# Patient Record
Sex: Female | Born: 1990 | Race: Black or African American | Hispanic: No
Health system: Midwestern US, Community
[De-identification: ages and names within clinical notes are randomized; demographics above are authoritative.]

## PROBLEM LIST (undated history)

## (undated) DIAGNOSIS — A64 Unspecified sexually transmitted disease: Secondary | ICD-10-CM

## (undated) DIAGNOSIS — Z3482 Encounter for supervision of other normal pregnancy, second trimester: Secondary | ICD-10-CM

## (undated) DIAGNOSIS — O4703 False labor before 37 completed weeks of gestation, third trimester: Secondary | ICD-10-CM

## (undated) DIAGNOSIS — O26892 Other specified pregnancy related conditions, second trimester: Secondary | ICD-10-CM

## (undated) DIAGNOSIS — Z3492 Encounter for supervision of normal pregnancy, unspecified, second trimester: Secondary | ICD-10-CM

## (undated) DIAGNOSIS — O210 Mild hyperemesis gravidarum: Secondary | ICD-10-CM

## (undated) DIAGNOSIS — O26893 Other specified pregnancy related conditions, third trimester: Secondary | ICD-10-CM

## (undated) DIAGNOSIS — O2312 Infections of bladder in pregnancy, second trimester: Secondary | ICD-10-CM

## (undated) DIAGNOSIS — O4700 False labor before 37 completed weeks of gestation, unspecified trimester: Secondary | ICD-10-CM

## (undated) DIAGNOSIS — O4692 Antepartum hemorrhage, unspecified, second trimester: Secondary | ICD-10-CM

## (undated) DIAGNOSIS — Z3483 Encounter for supervision of other normal pregnancy, third trimester: Secondary | ICD-10-CM

## (undated) DIAGNOSIS — O99891 Other specified diseases and conditions complicating pregnancy: Secondary | ICD-10-CM

## (undated) DIAGNOSIS — Z3493 Encounter for supervision of normal pregnancy, unspecified, third trimester: Secondary | ICD-10-CM

## (undated) DIAGNOSIS — O98319 Other infections with a predominantly sexual mode of transmission complicating pregnancy, unspecified trimester: Secondary | ICD-10-CM

## (undated) DIAGNOSIS — O4690 Antepartum hemorrhage, unspecified, unspecified trimester: Secondary | ICD-10-CM

## (undated) DIAGNOSIS — O2343 Unspecified infection of urinary tract in pregnancy, third trimester: Secondary | ICD-10-CM

## (undated) DIAGNOSIS — E079 Disorder of thyroid, unspecified: Secondary | ICD-10-CM

## (undated) DIAGNOSIS — E669 Obesity, unspecified: Secondary | ICD-10-CM

## (undated) DIAGNOSIS — R45851 Suicidal ideations: Principal | ICD-10-CM

## (undated) MED ORDER — METRONIDAZOLE 0.75 % VAGINAL GEL
0.75 % (37.5mg/5 gram) | Freq: Two times a day (BID) | VAGINAL | Status: AC
Start: ? — End: 2013-02-03

## (undated) MED ORDER — IBUPROFEN 600 MG TAB
600 mg | ORAL_TABLET | Freq: Three times a day (TID) | ORAL | Status: AC | PRN
Start: ? — End: 2012-07-10

## (undated) MED ORDER — HYDROCODONE-ACETAMINOPHEN 5 MG-325 MG TAB
5-325 mg | ORAL_TABLET | ORAL | Status: DC | PRN
Start: ? — End: 2014-02-14

## (undated) MED ORDER — OXYCODONE-ACETAMINOPHEN 10 MG-325 MG TAB
10-325 mg | ORAL_TABLET | Freq: Four times a day (QID) | ORAL | Status: DC | PRN
Start: ? — End: 2014-02-14

## (undated) MED ORDER — ONDANSETRON 4 MG TAB, RAPID DISSOLVE
4 mg | ORAL_TABLET | Freq: Three times a day (TID) | ORAL | Status: DC | PRN
Start: ? — End: 2013-03-12

## (undated) MED ORDER — PRENATAL VIT#27-IRON CARBONYL,FUM 60 MG IRON-FOLIC ACID 1 MG TABLET
60-1 mg iron-mg | ORAL_TABLET | Freq: Every day | ORAL | Status: AC
Start: ? — End: 2012-11-04

## (undated) MED ORDER — IBUPROFEN 600 MG TAB
600 mg | ORAL_TABLET | Freq: Three times a day (TID) | ORAL | Status: DC | PRN
Start: ? — End: 2013-07-02

## (undated) MED ORDER — METRONIDAZOLE 0.75 % VAGINAL GEL
0.75 % (37.5mg/5 gram) | Freq: Every day | VAGINAL | Status: AC
Start: ? — End: 2013-03-04

## (undated) MED ORDER — PRENATAL VIT#27-IRON CARBONYL,FUM 60 MG IRON-FOLIC ACID 1 MG TABLET
60-1 mg iron-mg | ORAL_TABLET | Freq: Every day | ORAL | Status: AC
Start: ? — End: 2012-09-02

## (undated) MED ORDER — FERROUS SULFATE 325 MG (65 MG ELEMENTAL IRON) TAB
325 mg (65 mg iron) | ORAL_TABLET | Freq: Three times a day (TID) | ORAL | Status: DC
Start: ? — End: 2014-02-14

## (undated) MED ORDER — PNV WITH CA,NO.72-IRON,CARBONYL-FA 29 MG IRON-1 MG TABLET
29 mg iron- 1 mg | ORAL_TABLET | Freq: Every day | ORAL | Status: DC
Start: ? — End: 2013-01-23

## (undated) MED ORDER — NITROFURANTOIN (25% MACROCRYSTAL FORM) 100 MG CAP
100 mg | ORAL_CAPSULE | Freq: Two times a day (BID) | ORAL | Status: DC
Start: ? — End: 2013-01-23

## (undated) MED ORDER — TRIMETHOPRIM-SULFAMETHOXAZOLE 160 MG-800 MG TAB
160-800 mg | ORAL_TABLET | Freq: Two times a day (BID) | ORAL | Status: AC
Start: ? — End: 2012-07-06

## (undated) MED ORDER — DOCUSATE SODIUM 100 MG CAP
100 mg | ORAL_CAPSULE | Freq: Every day | ORAL | Status: AC
Start: ? — End: 2013-06-12

---
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
## 2012-03-18 NOTE — L&D Delivery Note (Signed)
Delivery Summary    Patient: Linda [name] MRN: [id number]  SSN: [id number]    Date of Birth: [birth year]  Age: 22 y.o.  Sex: female       Information for the patient's newborn:  Ronnette, Rump Boy A [[id number]]       Labor Events:   Preterm Labor: No   Rupture Date: 03/12/2013   Rupture Time: 9:20 PM   Rupture Type AROM   Amniotic Fluid Volume: Moderate    Amniotic Fluid Description: Clear   Induction: AROM;Foley Bulb (balloon);Oxytocin       Augmentation:    Labor Events:       Cervical Ripening:   Foley/EASI     Delivery Events:  Episiotomy: None   Laceration(s): None     Repaired: None    Number of Repair Packets:    Suture Type and Size:       Estimated Blood Loss (ml):       Delivery Date: 03/12/2013    Delivery Time: 11:48 PM  Delivery Type: Spontaneous Vaginal Delivery   Sex:  Female     Gestational Age: 21.4 weeks.  Delivery Clinician:    Living Status: Yes  Delivery Location: L&D            APGARS  One minute Five minutes Ten minutes   Skin Color: 0   1       Heart Rate: 2   2       Reflex Irritability: 2   2       Muscle Tone: 2   2       Respiration: 2   2       Total: 8  9        Presentation: Vertex    Position: Left Occiput Anterior  Resuscitation Method:  Suctioning-bulb  Tactile Stimulation     Meconium Stained: None      Cord Information: 3 Vessels      Cord Events: None      Cord Blood Sent?:  Yes    Blood Gases Sent?:  No    Placenta:  Date: 03/12/2013   Time: 11:53 PM  Removal: Spontaneous    Appearance: Normal       Newborn Measurements:  Birth Weight: 7 lb 15 oz (3.6 kg)    Birth Length: 54.6 cm    Head Circumference: 33.5 cm    Chest Circumference: 35 cm    Abdominal Girth: 13.386    Other Providers:   Sena Slate Houle  Serafina Mitchell  Kiran S Debnath  Kelli Hope Obstetrician  Primary Nurse  Primary Newborn Nurse  Staff Nurse  Anesthesiologist  Scrub Tech                 Group B Strep:   Lab Results   Component Value Date/Time    GrBS positive  08/27/2011     Information for the patient's newborn:  Oreta, Soloway Boy A [[id number]]     Lab Results   Component Value Date/Time    ABO/Rh(D) [place] POSITIVE 03/12/2013 11:48 PM    DAT IgG NEG 03/12/2013 11:48 PM     No results found for this basename: aph, apco2, apo2, ahco3, abec, abdc, o2st, site, rscom

## 2012-07-03 LAB — WET PREP

## 2012-07-03 LAB — URINALYSIS W/ RFLX MICROSCOPIC
Bilirubin: NEGATIVE
Blood: NEGATIVE
Glucose: NEGATIVE mg/dL
Ketone: NEGATIVE mg/dL
Nitrites: NEGATIVE
Protein: NEGATIVE mg/dL
Specific gravity: 1.025 (ref 1.003–1.030)
Urobilinogen: 0.2 EU/dL (ref 0.2–1.0)
pH (UA): 6 (ref 5.0–8.0)

## 2012-07-03 LAB — URINE MICROSCOPIC ONLY: WBC: 21 /hpf (ref 0–4)

## 2012-07-03 LAB — HCG URINE, QL: HCG urine, QL: NEGATIVE

## 2012-07-03 MED ADMIN — azithromycin (ZITHROMAX) tablet 1,000 mg: ORAL | @ 15:00:00 | NDC 50268009815

## 2012-07-03 MED ADMIN — cefTRIAXone (ROCEPHIN) injection 250 mg: INTRAMUSCULAR | @ 15:00:00 | NDC 00409733701

## 2012-07-03 NOTE — ED Provider Notes (Signed)
HPI Comments: 22yo female presents to ER complaining of lower abdominal pain and left knee pain.  Abdominal discomfort started about 1 week ago, left knee started about 2-3 days ago.  LMP was at the beginning of last month.  Sexually active, no protection.   Left knee pain worse with weight bearing.     Patient is a 22 y.o. female presenting with abdominal pain and leg pain.   Abdominal Pain   Associated symptoms include arthralgias. Pertinent negatives include no fever, no diarrhea, no nausea, no vomiting, no constipation, no dysuria, no frequency, no chest pain and no back pain.   Leg Pain   Pertinent negatives include no back pain.        Past Medical History   Diagnosis Date   ??? Hypothyroidism    ??? Endocrine disease      hypothyroidism   ??? Acquired hypothyroidism      hypothroidism   ??? Morbid obesity         History reviewed. No pertinent past surgical history.      History reviewed. No pertinent family history.     History     Social History   ??? Marital Status: SINGLE     Spouse Name: N/A     Number of Children: N/A   ??? Years of Education: N/A     Occupational History   ??? Not on file.     Social History Main Topics   ??? Smoking status: Never Smoker    ??? Smokeless tobacco: Not on file   ??? Alcohol Use: No   ??? Drug Use: No   ??? Sexually Active: Yes -- Female partner(s)     Birth Control/ Protection: None     Other Topics Concern   ??? Not on file     Social History Narrative   ??? No narrative on file                  ALLERGIES: Percocet      Review of Systems   Constitutional: Negative for fever, chills, activity change, appetite change and fatigue.   HENT: Negative.    Respiratory: Negative.  Negative for shortness of breath.    Cardiovascular: Negative for chest pain.   Gastrointestinal: Positive for abdominal pain. Negative for nausea, vomiting, diarrhea, constipation and abdominal distention.   Genitourinary: Positive for pelvic pain. Negative for dysuria, frequency, flank pain, vaginal bleeding, vaginal discharge,  difficulty urinating, vaginal pain and dyspareunia.   Musculoskeletal: Positive for arthralgias. Negative for back pain and gait problem.   Skin: Negative for rash and wound.   All other systems reviewed and are negative.        Filed Vitals:    07/03/12 0957   BP: 145/83   Pulse: 77   Temp: 97.6 ??F (36.4 ??C)   Resp: 16   Height: 5\' 6"  (1.676 m)   Weight: 104.327 kg (230 lb)   SpO2: 99%            Physical Exam   Nursing note and vitals reviewed.  Constitutional: She is oriented to person, place, and time. No distress.   obese   HENT:   Mouth/Throat: Oropharynx is clear and moist.   Eyes: No scleral icterus.   Neck: Normal range of motion.   Cardiovascular: Normal rate, regular rhythm and normal heart sounds.    Pulmonary/Chest: Effort normal and breath sounds normal.   Abdominal: Soft. Bowel sounds are normal. She exhibits no distension and no mass. There  is tenderness. There is no guarding.   Musculoskeletal: Normal range of motion. She exhibits no edema and no tenderness.   No TTP, no swelling, no redness of left knee.  Pain in popliteal area with quadricep and hamstring tightening.  Mild discomfort with flexion.   Neurological: She is alert and oriented to person, place, and time.   Skin: Skin is warm and dry. She is not diaphoretic.        MDM     Differential Diagnosis; Clinical Impression; Plan:     23yo female presents with 2 non-related complaints, lower abdominal discomfort and left knee pain.  I feel patient has concern about not having menstrual cycle since the beginning of March but history shows irregular cycles in past.   Left knee pain is atraumatic and I do not feel emergent imaging is warranted.   UA shows many wbc's and 4+ bacteria, denies urinary symptoms but will treat with Bactrim x 3 days.  UCx pending.  Will treat empirically for STD's  Wet prep shows many clue cells but patient is without vaginal irritation.      Procedures    PELVIC EXAM- EXTERNAL GENITALIA WITHOUT LESIONS, ERYTHEMA, SCANT  DISCHARGE.  SPECULUM EXAM- NO ERYTHEMA OF VAGINAL MUCOSA OR CERVIX, MODERATE CREAMY WHITE DISCHARGE, MALODOROUS.  BIMANUAL EXAM- MILD BILATERAL ADNEXAL TTP, NO MASSES.  NO CMT

## 2012-07-03 NOTE — ED Notes (Signed)
Pt. States she had on & off lower abdominal pain,  For a week , left leg pain  Since yesterday

## 2012-07-03 NOTE — ED Notes (Signed)
Patient reports intermittent left lower abdominal pain and left leg pain.  Patient is sexually active with no protection.  G1P1.  Patient denies being pregnant.

## 2012-07-03 NOTE — ED Notes (Signed)
Patient armband removed and shredded  I have reviewed discharge instructions with the patient.  The patient verbalized understanding.

## 2012-07-03 NOTE — ED Provider Notes (Signed)
I was personally available for consultation in the emergency department. I have reviewed the chart prior to the patient's discharge and agree with the documentation recorded by the MLP, including the assessment, treatment plan, and disposition.

## 2012-07-04 LAB — CULTURE, URINE
Culture result:: >100000
Culture: >100000

## 2012-07-06 LAB — CHLAMYDIA/GC PCR
Chlamydia amplified: POSITIVE — AB
N. gonorrhea, amplified: NEGATIVE

## 2012-07-07 NOTE — Progress Notes (Signed)
Quick Note:    CALLED PATIENT BACK AND INFORMED HER OF + CHLAMYDIA. SHE WAS TREATED IN ER WITH ZITHROMAX AND ROCEPHIN. INFORMED TO NOTIFY PARTNERS TO FOLLOW UP WITH HEALTH DEPARTMENT FOR TREATMENT AND TO AVOID UNPROTECTED SEX.  ______

## 2012-07-07 NOTE — ED Notes (Cosign Needed)
CALLED PATIENT BACK AND INFORMED HER OF + CHLAMYDIA.  SHE WAS TREATED IN ER WITH ZITHROMAX AND ROCEPHIN.  INFORMED TO NOTIFY PARTNERS TO FOLLOW UP WITH HEALTH DEPARTMENT FOR TREATMENT AND TO AVOID UNPROTECTED SEX.

## 2012-07-27 LAB — AMB POC URINE PREGNANCY TEST, VISUAL COLOR COMPARISON: HCG urine, Ql. (POC): POSITIVE

## 2012-07-27 NOTE — Patient Instructions (Signed)
MyChart Activation    Thank you for requesting access to MyChart. Please follow the instructions below to securely access and download your online medical record. MyChart allows you to send messages to your doctor, view your test results, renew your prescriptions, schedule appointments, and more.    How Do I Sign Up?    1. In your internet browser, go to [email or web address]  2. Click on the First Time User? Click Here link in the Sign In box. You will be redirect to the New Member Sign Up page.  3. Enter your MyChart Access Code exactly as it appears below. You will not need to use this code after you???ve completed the sign-up process. If you do not sign up before the expiration date, you must request a new code.    MyChart Access Code: [email or web address]  Expires: 10/01/2012 11:17 AM (This is the date your MyChart access code will expire)    4. Enter the last four digits of your Social Security Number (xxxx) and Date of Birth (mm/dd/yyyy) as indicated and click Submit. You will be taken to the next sign-up page.  5. Create a MyChart ID. This will be your MyChart login ID and cannot be changed, so think of one that is secure and easy to remember.  6. Create a MyChart password. You can change your password at any time.  7. Enter your Password Reset Question and Answer. This can be used at a later time if you forget your password.   8. Enter your e-mail address. You will receive e-mail notification when new information is available in MyChart.  9. Click Sign Up. You can now view and download portions of your medical record.  10. Click the Download Summary menu link to download a portable copy of your medical information.    Additional Information    If you have questions, please visit the Frequently Asked Questions section of the MyChart website at [email or web address] Remember, MyChart is NOT to be used for urgent needs. For medical emergencies, dial 911.

## 2012-07-27 NOTE — Progress Notes (Signed)
SUBJECTIVE:  This is a 22 year old G1 P1 who presents with missed menses x1. She denies any nausea, vomiting, or breast tenderness. She has not performed a pregnancy test. Past medical history was reviewed.[01]   OBJECTIVE:  Vital signs are stable.  She's well-developed obese black female in no apparent distress.  Abdomen obese and nontender.  External genitalia normal.  Vaginal canal normal.  Cervix normal.  Uterus normal size but soft.  Adnexa unremarkable  HCG positive02]  ASSESSMENT:  Missed menses secondary to early gestation.[03]  PLAN:  1.  Prenatal vitamins daily.  2. Follow up in one month.[04]

## 2012-07-28 LAB — BETA HCG, QT
HCG, BETA, HCGTLT: 45869 m[IU]/mL
HCG, beta, QT: 45869 m[IU]/mL

## 2012-08-03 LAB — N GONORRHOEAE, DNA PROBE: Gonorrhea, External: NEGATIVE

## 2012-08-03 LAB — CHLAMYDIA DNA PROBE: Chlamydia, External: NEGATIVE

## 2012-08-03 LAB — RPR
RPR, EXTERNAL: NONREACTIVE
RPR, External: NONREACTIVE

## 2012-08-03 LAB — HEPATITIS B SURFACE AG W/REFLEX: HBsAg, External: NEGATIVE

## 2012-08-03 LAB — HIV-1 AB WESTERN BLOT CONFIRM ONLY
HIV, EXTERNAL: NEGATIVE
HIV, External: NEGATIVE

## 2012-08-03 LAB — RUBELLA AB, IGG: Rubella, External: IMMUNE

## 2012-08-03 NOTE — Patient Instructions (Signed)
MyChart Activation    Thank you for requesting access to MyChart. Please follow the instructions below to securely access and download your online medical record. MyChart allows you to send messages to your doctor, view your test results, renew your prescriptions, schedule appointments, and more.    How Do I Sign Up?    1. In your internet browser, go to [email or web address]  2. Click on the First Time User? Click Here link in the Sign In box. You will be redirect to the New Member Sign Up page.  3. Enter your MyChart Access Code exactly as it appears below. You will not need to use this code after you???ve completed the sign-up process. If you do not sign up before the expiration date, you must request a new code.    MyChart Access Code: [email or web address]  Expires: 10/01/2012 11:17 AM (This is the date your MyChart access code will expire)    4. Enter the last four digits of your Social Security Number (xxxx) and Date of Birth (mm/dd/yyyy) as indicated and click Submit. You will be taken to the next sign-up page.  5. Create a MyChart ID. This will be your MyChart login ID and cannot be changed, so think of one that is secure and easy to remember.  6. Create a MyChart password. You can change your password at any time.  7. Enter your Password Reset Question and Answer. This can be used at a later time if you forget your password.   8. Enter your e-mail address. You will receive e-mail notification when new information is available in MyChart.  9. Click Sign Up. You can now view and download portions of your medical record.  10. Click the Download Summary menu link to download a portable copy of your medical information.    Additional Information    If you have questions, please visit the Frequently Asked Questions section of the MyChart website at [email or web address] Remember, MyChart is NOT to be used for urgent needs. For medical emergencies, dial 911.

## 2012-08-03 NOTE — Progress Notes (Signed)
Please see flow sheet.  7 weeks 6 days doing well.  Follow up in 4 weeks.

## 2012-08-07 LAB — PRENATAL PROFILE I
ABS. BASOPHILS: 0 10*3/uL (ref 0.0–0.2)
ABS. EOSINOPHILS: 0.1 10*3/uL (ref 0.0–0.4)
ABS. IMM. GRANS.: 0 10*3/uL (ref 0.0–0.1)
ABS. MONOCYTES: 0.3 10*3/uL (ref 0.1–0.9)
ABS. NEUTROPHILS: 2.1 10*3/uL (ref 1.4–7.0)
Abs Lymphocytes: 1.5 10*3/uL (ref 0.7–3.1)
Antibody screen: NEGATIVE
BASOPHILS: 1 % (ref 0–3)
EOSINOPHILS: 2 % (ref 0–5)
HCT: 35.4 % (ref 34.0–46.6)
HGB: 11.9 g/dL (ref 11.1–15.9)
Hep B surface Ag screen: NEGATIVE
IMMATURE GRANULOCYTES: 0 % (ref 0–2)
Lymphocytes: 37 % (ref 14–46)
MCH: 26 pg — ABNORMAL LOW (ref 26.6–33.0)
MCHC: 33.6 g/dL (ref 31.5–35.7)
MCV: 78 fL — ABNORMAL LOW (ref 79–97)
MONOCYTES: 8 % (ref 4–12)
NEUTROPHILS: 52 % (ref 40–74)
PLATELET: 291 10*3/uL (ref 155–379)
RBC: 4.57 x10E6/uL (ref 3.77–5.28)
RDW: 16.6 % — ABNORMAL HIGH (ref 12.3–15.4)
RPR: NONREACTIVE
Rh (D): POSITIVE
Rubella Ab, IgG: 6.75 index
WBC: 4 10*3/uL (ref 3.4–10.8)

## 2012-08-07 LAB — HIV 1/2 AB, BY IMMUNOBLOT
HIV 1/O/2 Abs, QL: NONREACTIVE
HIV 1/O/2 Abs, Qual: NONREACTIVE
HIV 1/O/2 Abs-Index Value: <1 (ref <1.00)
HIV 1/O/2 Abs: <1 (ref <1.00)

## 2012-08-07 LAB — HEMOGLOBIN FRACTIONATION
HEMOGLOBIN A2: 3.9 % — ABNORMAL HIGH (ref 0.7–3.1)
HEMOGLOBIN F: 0 % (ref 0.0–2.0)
HEMOGLOBIN S: 0 %
HGB A: 63.3 % — ABNORMAL LOW (ref 94.0–98.0)
HGB SOLUBILITY: NEGATIVE
Hemoglobin A2: 3.9 % — ABNORMAL HIGH (ref 0.7–3.1)
Hemoglobin A: 63.3 % — ABNORMAL LOW (ref 94.0–98.0)
Hemoglobin C: 32.8 % — ABNORMAL HIGH
Hemoglobin C: 32.8 % — ABNORMAL HIGH
Hemoglobin F: 0 % (ref 0.0–2.0)
Hemoglobin S: 0 %
Hgb Solubility: NEGATIVE

## 2012-10-05 NOTE — Patient Instructions (Signed)
MyChart Activation    Thank you for requesting access to MyChart. Please follow the instructions below to securely access and download your online medical record. MyChart allows you to send messages to your doctor, view your test results, renew your prescriptions, schedule appointments, and more.    How Do I Sign Up?    1. In your internet browser, go to [email or web address]  2. Click on the First Time User? Click Here link in the Sign In box. You will be redirect to the New Member Sign Up page.  3. Enter your MyChart Access Code exactly as it appears below. You will not need to use this code after you???ve completed the sign-up process. If you do not sign up before the expiration date, you must request a new code.    MyChart Access Code: [email or web address]  Expires: 01/03/2013  2:26 PM (This is the date your MyChart access code will expire)    4. Enter the last four digits of your Social Security Number (xxxx) and Date of Birth (mm/dd/yyyy) as indicated and click Submit. You will be taken to the next sign-up page.  5. Create a MyChart ID. This will be your MyChart login ID and cannot be changed, so think of one that is secure and easy to remember.  6. Create a MyChart password. You can change your password at any time.  7. Enter your Password Reset Question and Answer. This can be used at a later time if you forget your password.   8. Enter your e-mail address. You will receive e-mail notification when new information is available in MyChart.  9. Click Sign Up. You can now view and download portions of your medical record.  10. Click the Download Summary menu link to download a portable copy of your medical information.    Additional Information    If you have questions, please visit the Frequently Asked Questions section of the MyChart website at [email or web address] Remember, MyChart is NOT to be used for urgent needs. For medical emergencies, dial 911.

## 2012-10-05 NOTE — Progress Notes (Signed)
Please see flow sheet.  16 weeks 6 days doing well.  Macrobid for UTI.

## 2012-10-07 LAB — AFP TETRA SCREEN, OBSTETRICAL
AFP MoM: 1.12
AFP Value: 29.1 ng/mL
DIA MoM: 0.61
DIA Value: 82.19 pg/mL
DSR (2nd Trimester) - 1 in: 10000
DSR (by age) - 1 in: 1161
Gest. Age on Collection Date: 16 WEEKS
Maternal Age At EDD: 20.2 YEARS
OSBR Risk - 1 in: 10000
T18 (by age): 1:4522 {titer}
Weight: 264 [lb_av]
hCG MoM: 0.4
hCG Value: 9172 m[IU]/mL
uE3 MoM: 0.87
uE3 Value: 0.62 ng/mL

## 2012-11-03 NOTE — ED Notes (Signed)
Patient left ambulatory, no s/s of distress or discomfort

## 2012-11-03 NOTE — ED Notes (Signed)
Hematoma to left eye, rates pain as 10/10
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
## 2012-11-03 NOTE — ED Notes (Signed)
Per patient PD was called.
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
## 2012-11-04 LAB — HCG URINE, QL: HCG urine, QL: POSITIVE — AB

## 2012-11-04 MED ADMIN — HYDROcodone-acetaminophen (NORCO) 5-325 mg per tablet 1 Tab: ORAL | @ 01:00:00 | NDC 68084036811

## 2012-11-04 MED ADMIN — ondansetron (ZOFRAN ODT) tablet 4 mg: ORAL | @ 01:00:00 | NDC 68462015740

## 2012-11-13 NOTE — Patient Instructions (Signed)
MyChart Activation    Thank you for requesting access to MyChart. Please follow the instructions below to securely access and download your online medical record. MyChart allows you to send messages to your doctor, view your test results, renew your prescriptions, schedule appointments, and more.    How Do I Sign Up?    1. In your internet browser, go to [email or web address]  2. Click on the First Time User? Click Here link in the Sign In box. You will be redirect to the New Member Sign Up page.  3. Enter your MyChart Access Code exactly as it appears below. You will not need to use this code after you???ve completed the sign-up process. If you do not sign up before the expiration date, you must request a new code.    MyChart Access Code: [email or web address]  Expires: 01/03/2013  2:26 PM (This is the date your MyChart access code will expire)    4. Enter the last four digits of your Social Security Number (xxxx) and Date of Birth (mm/dd/yyyy) as indicated and click Submit. You will be taken to the next sign-up page.  5. Create a MyChart ID. This will be your MyChart login ID and cannot be changed, so think of one that is secure and easy to remember.  6. Create a MyChart password. You can change your password at any time.  7. Enter your Password Reset Question and Answer. This can be used at a later time if you forget your password.   8. Enter your e-mail address. You will receive e-mail notification when new information is available in MyChart.  9. Click Sign Up. You can now view and download portions of your medical record.  10. Click the Download Summary menu link to download a portable copy of your medical information.    Additional Information    If you have questions, please visit the Frequently Asked Questions section of the MyChart website at [email or web address] Remember, MyChart is NOT to be used for urgent needs. For medical emergencies, dial 911.

## 2012-11-13 NOTE — Progress Notes (Signed)
Please see flow sheet.  22 weeks 3 days doing well.  Follow up in 4 weeks.

## 2012-12-21 NOTE — Progress Notes (Signed)
Please see flow sheet.  27 weeks 6 days doing well.  Follow up in 2 weeks.

## 2012-12-21 NOTE — Patient Instructions (Signed)
MyChart Activation    Thank you for requesting access to MyChart. Please follow the instructions below to securely access and download your online medical record. MyChart allows you to send messages to your doctor, view your test results, renew your prescriptions, schedule appointments, and more.    How Do I Sign Up?    1. In your internet browser, go to [email or web address]  2. Click on the First Time User? Click Here link in the Sign In box. You will be redirect to the New Member Sign Up page.  3. Enter your MyChart Access Code exactly as it appears below. You will not need to use this code after you???ve completed the sign-up process. If you do not sign up before the expiration date, you must request a new code.    MyChart Access Code: [email or web address]  Expires: 01/03/2013  2:26 PM (This is the date your MyChart access code will expire)    4. Enter the last four digits of your Social Security Number (xxxx) and Date of Birth (mm/dd/yyyy) as indicated and click Submit. You will be taken to the next sign-up page.  5. Create a MyChart ID. This will be your MyChart login ID and cannot be changed, so think of one that is secure and easy to remember.  6. Create a MyChart password. You can change your password at any time.  7. Enter your Password Reset Question and Answer. This can be used at a later time if you forget your password.   8. Enter your e-mail address. You will receive e-mail notification when new information is available in MyChart.  9. Click Sign Up. You can now view and download portions of your medical record.  10. Click the Download Summary menu link to download a portable copy of your medical information.    Additional Information    If you have questions, please visit the Frequently Asked Questions section of the MyChart website at [email or web address] Remember, MyChart is NOT to be used for urgent needs. For medical emergencies, dial 911.

## 2012-12-22 LAB — GEST. DIABETES 1-HR SCREEN: Gestational Diabetes Screen: 104 mg/dL (ref 65–139)

## 2013-01-23 ENCOUNTER — Inpatient Hospital Stay: Payer: MEDICAID

## 2013-01-23 MED ADMIN — lactated ringers infusion: INTRAVENOUS | @ 19:00:00 | NDC 00409795309

## 2013-01-23 NOTE — Progress Notes (Signed)
Off monitor to void

## 2013-01-23 NOTE — Progress Notes (Signed)
G2 P1 presents with vaginal spotting since last night @ 32 weeks and 4 days  Denies water breaking , reports she has not felt her baby move today and has not  Had any food or liquids today . Reports sharp abdominal pain 9/10 lower abdomen. Abdomen palpates semisoft. Plan of care discussed with patient. TOCO and EFM monitor applied

## 2013-01-23 NOTE — Progress Notes (Addendum)
To radiology via wheel chair for ultrasound

## 2013-01-23 NOTE — H&P (Signed)
History and Physical      Pt is a G3P1001 at 32.4wk who presented to L&D complaining of vaginal bleeding.  Her prenatal course has been uncomplicated with Dr. Johny Chess.  Please refer to prenatal record for complete information regarding prenatal course.    AFVSS  FHT: + variability but nonreactive by criteria. Tracing became reactive after BPP  Toco: none  SVE: closed and thick with moderate blood noted by RN  Membranes: intact  BPP:8/8    A/P:  Reassuring maternal and fetal status.   F/u with Dr. Johny Chess Monday   Discharge home   F/u with primary OB.

## 2013-01-23 NOTE — Progress Notes (Signed)
Patient given verbal discharge instructions  Instructions given for bedrest and labor percautions and bleeding percautions. Verbalized understanding of teaching

## 2013-01-23 NOTE — Progress Notes (Signed)
Patient placed back on monitor scant amount of dark blood noted on pad

## 2013-01-23 NOTE — Progress Notes (Signed)
Returned from radiology via wheelchair

## 2013-01-23 NOTE — Progress Notes (Signed)
Call placed to Dr Welton Flakes informing of patients complaints and bleeding and nonreactive strip  New orders received

## 2013-01-23 NOTE — Progress Notes (Signed)
Call placed to Dr Welton Flakes informing of BPP 8/10 and scant amount of bleeding abdomen still remains semi soft okay to discharge to home with bed rest and followup with Dr, Johny Chess on Monday 11/ 10/14

## 2013-01-23 NOTE — Progress Notes (Signed)
Ultrasound paged for BPP due to NST not reactive

## 2013-01-27 NOTE — Progress Notes (Signed)
G 3 P 1@ 33 w 1 d.   Complains of vaginal bleeding and possible PROM..  See prenatal flow sheet.  Normal prenatal exam.   RTC 2 weeks.

## 2013-01-27 NOTE — Patient Instructions (Signed)
MyChart Activation    Thank you for requesting access to MyChart. Please follow the instructions below to securely access and download your online medical record. MyChart allows you to send messages to your doctor, view your test results, renew your prescriptions, schedule appointments, and more.    How Do I Sign Up?    1. In your internet browser, go to [email or web address]  2. Click on the First Time User? Click Here link in the Sign In box. You will be redirect to the New Member Sign Up page.  3. Enter your MyChart Access Code exactly as it appears below. You will not need to use this code after you???ve completed the sign-up process. If you do not sign up before the expiration date, you must request a new code.    MyChart Access Code: [email or web address]  Expires: 04/23/2013  4:25 PM (This is the date your MyChart access code will expire)    4. Enter the last four digits of your Social Security Number (xxxx) and Date of Birth (mm/dd/yyyy) as indicated and click Submit. You will be taken to the next sign-up page.  5. Create a MyChart ID. This will be your MyChart login ID and cannot be changed, so think of one that is secure and easy to remember.  6. Create a MyChart password. You can change your password at any time.  7. Enter your Password Reset Question and Answer. This can be used at a later time if you forget your password.   8. Enter your e-mail address. You will receive e-mail notification when new information is available in MyChart.  9. Click Sign Up. You can now view and download portions of your medical record.  10. Click the Download Summary menu link to download a portable copy of your medical information.    Additional Information    If you have questions, please visit the Frequently Asked Questions section of the MyChart website at [email or web address] Remember, MyChart is NOT to be used for urgent needs. For medical emergencies, dial 911.

## 2013-01-30 LAB — CHLAMYDIA/GC PCR
Chlamydia trachomatis, NAA: NEGATIVE
Neisseria gonorrhoeae, NAA: POSITIVE — AB

## 2013-02-01 NOTE — Progress Notes (Signed)
Quick Note:    Postive GC.--Please call in the patient for treatment with Rocephin 250 mg im.  ______

## 2013-02-02 NOTE — Telephone Encounter (Signed)
Pt was called and informed that her test for GC was postive and she needed to be treated for it. Pt was asked to come in on 11/ 19/2014 by 12 30 pm pt say she did not have away, she would have to give medicaid a 5 day notices and she had know one to bring her in.

## 2013-02-02 NOTE — Telephone Encounter (Signed)
Message copied by Melody Comas on Tue Feb 02, 2013  3:34 PM  ------       Message from: Basilia Jumbo       Created: Mon Feb 01, 2013 10:04 PM         Postive GC.--Please call in the patient for treatment with Rocephin 250 mg im.  ------

## 2013-02-03 NOTE — Progress Notes (Signed)
Pt here in office for Roecephin injection for treatment of Gonorrhea. She tolerated injection well in left gluteus and left office no problems or concerns.

## 2013-02-03 NOTE — Telephone Encounter (Signed)
Message copied by Melody Comas on Wed Feb 03, 2013  8:00 AM  ------       Message from: Basilia Jumbo       Created: Mon Feb 01, 2013 10:04 PM         Postive GC.--Please call in the patient for treatment with Rocephin 250 mg im.  ------

## 2013-02-03 NOTE — Telephone Encounter (Signed)
Message copied by Melody Comas on Wed Feb 03, 2013  8:01 AM  ------       Message from: Linda [name]       Created: Mon Feb 01, 2013 10:04 PM         Postive GC.--Please call in the patient for treatment with Rocephin 250 mg im.  ------

## 2013-02-03 NOTE — Telephone Encounter (Signed)
Pt called back on 02/02/2013 she will be in on 02/03/2013 to get treated for positive GC.

## 2013-02-03 NOTE — Patient Instructions (Signed)
MyChart Activation    Thank you for requesting access to MyChart. Please follow the instructions below to securely access and download your online medical record. MyChart allows you to send messages to your doctor, view your test results, renew your prescriptions, schedule appointments, and more.    How Do I Sign Up?    1. In your internet browser, go to [email or web address]  2. Click on the First Time User? Click Here link in the Sign In box. You will be redirect to the New Member Sign Up page.  3. Enter your MyChart Access Code exactly as it appears below. You will not need to use this code after you???ve completed the sign-up process. If you do not sign up before the expiration date, you must request a new code.    MyChart Access Code: [email or web address]  Expires: 04/23/2013  4:25 PM (This is the date your MyChart access code will expire)    4. Enter the last four digits of your Social Security Number (xxxx) and Date of Birth (mm/dd/yyyy) as indicated and click Submit. You will be taken to the next sign-up page.  5. Create a MyChart ID. This will be your MyChart login ID and cannot be changed, so think of one that is secure and easy to remember.  6. Create a MyChart password. You can change your password at any time.  7. Enter your Password Reset Question and Answer. This can be used at a later time if you forget your password.   8. Enter your e-mail address. You will receive e-mail notification when new information is available in MyChart.  9. Click Sign Up. You can now view and download portions of your medical record.  10. Click the Download Summary menu link to download a portable copy of your medical information.    Additional Information    If you have questions, please visit the Frequently Asked Questions section of the MyChart website at [email or web address] Remember, MyChart is NOT to be used for urgent needs. For medical emergencies, dial 911.

## 2013-02-10 NOTE — Progress Notes (Signed)
G 3 P 1@ 35 w 1 d.  Has no complaints.  See prenatal flow sheet.  Normal prenatal exam.   RTC 1 week.

## 2013-02-10 NOTE — Patient Instructions (Signed)
MyChart Activation    Thank you for requesting access to MyChart. Please follow the instructions below to securely access and download your online medical record. MyChart allows you to send messages to your doctor, view your test results, renew your prescriptions, schedule appointments, and more.    How Do I Sign Up?    1. In your internet browser, go to [email or web address]  2. Click on the First Time User? Click Here link in the Sign In box. You will be redirect to the New Member Sign Up page.  3. Enter your MyChart Access Code exactly as it appears below. You will not need to use this code after you???ve completed the sign-up process. If you do not sign up before the expiration date, you must request a new code.    MyChart Access Code: [email or web address]  Expires: 04/23/2013  4:25 PM (This is the date your MyChart access code will expire)    4. Enter the last four digits of your Social Security Number (xxxx) and Date of Birth (mm/dd/yyyy) as indicated and click Submit. You will be taken to the next sign-up page.  5. Create a MyChart ID. This will be your MyChart login ID and cannot be changed, so think of one that is secure and easy to remember.  6. Create a MyChart password. You can change your password at any time.  7. Enter your Password Reset Question and Answer. This can be used at a later time if you forget your password.   8. Enter your e-mail address. You will receive e-mail notification when new information is available in MyChart.  9. Click Sign Up. You can now view and download portions of your medical record.  10. Click the Download Summary menu link to download a portable copy of your medical information.    Additional Information    If you have questions, please visit the Frequently Asked Questions section of the MyChart website at [email or web address] Remember, MyChart is NOT to be used for urgent needs. For medical emergencies, dial 911.

## 2013-02-25 NOTE — Patient Instructions (Signed)
MyChart Activation    Thank you for requesting access to MyChart. Please follow the instructions below to securely access and download your online medical record. MyChart allows you to send messages to your doctor, view your test results, renew your prescriptions, schedule appointments, and more.    How Do I Sign Up?    1. In your internet browser, go to [email or web address]  2. Click on the First Time User? Click Here link in the Sign In box. You will be redirect to the New Member Sign Up page.  3. Enter your MyChart Access Code exactly as it appears below. You will not need to use this code after you???ve completed the sign-up process. If you do not sign up before the expiration date, you must request a new code.    MyChart Access Code: [email or web address]  Expires: 04/23/2013  4:25 PM (This is the date your MyChart access code will expire)    4. Enter the last four digits of your Social Security Number (xxxx) and Date of Birth (mm/dd/yyyy) as indicated and click Submit. You will be taken to the next sign-up page.  5. Create a MyChart ID. This will be your MyChart login ID and cannot be changed, so think of one that is secure and easy to remember.  6. Create a MyChart password. You can change your password at any time.  7. Enter your Password Reset Question and Answer. This can be used at a later time if you forget your password.   8. Enter your e-mail address. You will receive e-mail notification when new information is available in MyChart.  9. Click Sign Up. You can now view and download portions of your medical record.  10. Click the Download Summary menu link to download a portable copy of your medical information.    Additional Information    If you have questions, please visit the Frequently Asked Questions section of the MyChart website at [email or web address] Remember, MyChart is NOT to be used for urgent needs. For medical emergencies, dial 911.

## 2013-02-25 NOTE — Progress Notes (Signed)
G 3 P 1@ 37 w 2 d.  She was treated with Rocephin for + GC infection.  Has no complaints.  See prenatal flow sheet.  Normal prenatal exam.   RTC 1 week.  Repeat GC culture--done.  Rx for Ivery Quale is also given today.

## 2013-02-27 LAB — CHLAMYDIA/GC PCR
Chlamydia trachomatis, NAA: NEGATIVE
Neisseria gonorrhoeae, NAA: NEGATIVE

## 2013-03-12 ENCOUNTER — Inpatient Hospital Stay
Admit: 2013-03-12 | Discharge: 2013-03-14 | Disposition: A | Discharge status: Home / Self Care | Payer: MEDICAID | Attending: Obstetrics & Gynecology | Admitting: Obstetrics & Gynecology

## 2013-03-12 DIAGNOSIS — O99892 Other specified diseases and conditions complicating childbirth: Secondary | ICD-10-CM

## 2013-03-12 LAB — CBC WITH AUTOMATED DIFF
ABS. BASOPHILS: 0 10*3/uL (ref 0.0–0.06)
ABS. EOSINOPHILS: 0.1 10*3/uL (ref 0.0–0.4)
ABS. LYMPHOCYTES: 2.2 10*3/uL (ref 0.9–3.6)
ABS. MONOCYTES: 0.5 10*3/uL (ref 0.05–1.2)
ABS. NEUTROPHILS: 4.6 10*3/uL (ref 1.8–8.0)
BASOPHILS: 0 % (ref 0–2)
EOSINOPHILS: 2 % (ref 0–5)
HCT: 31.2 % — ABNORMAL LOW (ref 35.0–45.0)
HGB: 10.7 g/dL — ABNORMAL LOW (ref 12.0–16.0)
LYMPHOCYTES: 29 % (ref 21–52)
MCH: 25.8 PG (ref 24.0–34.0)
MCHC: 34.3 g/dL (ref 31.0–37.0)
MCV: 75.2 FL (ref 74.0–97.0)
MONOCYTES: 7 % (ref 3–10)
MPV: 9.1 FL — ABNORMAL LOW (ref 9.2–11.8)
NEUTROPHILS: 62 % (ref 40–73)
PLATELET: 193 10*3/uL (ref 135–420)
RBC: 4.15 M/uL — ABNORMAL LOW (ref 4.20–5.30)
RDW: 14.9 % — ABNORMAL HIGH (ref 11.6–14.5)
WBC: 7.5 10*3/uL (ref 4.6–13.2)

## 2013-03-12 LAB — TYPE & SCREEN
ABO/Rh(D): O POS
Antibody screen: NEGATIVE

## 2013-03-12 LAB — TYPE AND SCREEN
ABO/Rh: O POS
Antibody Screen: NEGATIVE

## 2013-03-12 MED ADMIN — oxytocin (PITOCIN) 20 units/1000 ml LR: INTRAVENOUS | @ 22:00:00 | NDC 61553076904

## 2013-03-12 MED ADMIN — oxytocin (PITOCIN) 20 units/1000 ml LR: INTRAVENOUS | @ 19:00:00 | NDC 61553076904

## 2013-03-12 MED ADMIN — oxytocin (PITOCIN) 20 units/1000 ml LR: INTRAVENOUS | @ 21:00:00 | NDC 61553076904

## 2013-03-12 MED ADMIN — penicillin G potassium (PFIZERPEN) 5 Million Units in 0.9% sodium chloride (MBP/ADV) 100 mL MBP: INTRAVENOUS | @ 19:00:00 | NDC 00338055318

## 2013-03-12 MED ADMIN — penicillin G pot (PFIZERPEN) 2.5 Million Units in 50 ml 0.9% NaCl: INTRAVENOUS | @ 23:00:00 | NDC 99990008227

## 2013-03-12 MED ADMIN — oxytocin (PITOCIN) 20 units/1000 ml LR: INTRAVENOUS | @ 19:00:00 | NDC 53191040901

## 2013-03-12 MED ADMIN — oxytocin (PITOCIN) 20 units/1000 ml LR: INTRAVENOUS | @ 20:00:00 | NDC 53191040901

## 2013-03-12 MED ADMIN — lactated ringers infusion: INTRAVENOUS | @ 17:00:00 | NDC 00409795309

## 2013-03-12 NOTE — Progress Notes (Signed)
Unable to keep fetus on monitor. Dr. Johny Chess at bedside with FSE.

## 2013-03-12 NOTE — Progress Notes (Signed)
Assumed care of patient. Patient reports pain 4/10. Is unsure if she wants an epidural.

## 2013-03-12 NOTE — Other (Signed)
Epidural Catheter Placement for Labor patient    Operator: Criselda Peaches, MD      Risks, benefits, limitations and alternatives were discussed with patient and consent obtained. Time out performed, correct patient and site identified.     Standard monitors applied.   Patient placed in sitting position, back prepped with Betadine. and draped aseptically.1.5% lidocaine  2 ml was injected in skin and subcutaneously.     18g Tuohy needle placed at L2 - L3 level. Epidural space was entered   by loss of resistance technique with air . No blood or CSF was aspirated via the epidural needle. Epidural catheter was placed  for 3 cm inside the epidural space via Tuohy needle, no paresthesias was felt and the catheter was secured on the skin with Tagederm tape,  A test dose of  2 ml. Xylocaine 1% with 1:200,000 epinephrine was injected after negative aspiration and no heart rate increase was observed with pulse oximetry.   I.V. LR about 800 ml was administered within 30 minutes before starting the epidural procedure.  An initial bolus dose of 10 ml of 0.2% Ropivacaine with 100 mcg of Sublimaze was given via epidural catheter after negative aspiration.     Initial analgesia level was obtained up to T 10 level.    Patient tolerated the procedure well, V.S. were monitored throughout and was within acceptable range. No apparent complications were found.     Epidural continuous infusion was started per epidural infusion order.    Visit Vitals   Item Reading   ??? BP 132/71   ??? Pulse 78   ??? Temp 98.2 ??F (36.8 ??C)   ??? Resp 18   ??? Ht 5\' 3"  (1.6 m)   ??? Wt 119.75 kg (264 lb)   ??? BMI 46.78 kg/m2   ??? Breastfeeding No       Criselda Peaches, MD  @DATE @  .10:24 PM

## 2013-03-12 NOTE — Progress Notes (Signed)
Pt c/o urge to push, Dr Johny Chess in. SVE 8cm AROM clear fluid. Pt instructed not to bear down with contractions

## 2013-03-12 NOTE — Progress Notes (Signed)
Patient seated at bedside for epidural. Dr. Myrtice Lauth and RNs Leticia Clas and [place] at bedside.

## 2013-03-12 NOTE — Progress Notes (Signed)
Dr. Johny Chess at bedside. Patient complaining of urge to push. Advised not to push. Patient requesting an epidural.

## 2013-03-12 NOTE — H&P (Signed)
History & Physical    Name: Linda [name] MRN: [id number]  SSN: [id number]    Date of Birth: 1991-03-13  Age: 22 y.o.  Sex: female        Subjective:     Estimated Date of Delivery: 03/16/13  OB History    Grav Para Term Preterm Abortions TAB SAB Ect Mult Living    3 1 1       1        # Outc Date GA Lbr Len/2nd Wgt Sex Del Anes PTL Lv    1 TRM 6/13 [redacted]w[redacted]d 00:00 7lb13.1oz(3.547kg) F VAGINAL DELI EPIDURAL AN No Yes    2 CUR             3 GRA             Comments: System Generated. Please review and update pregnancy details.          Ms. Hoelzer is admitted with pregnancy at [redacted]w[redacted]d for induction of labor. Prenatal course was normal pregnancy. Please see prenatal records for details.    Past Medical History   Diagnosis Date   ??? Hypothyroidism    ??? Endocrine disease      hypothyroidism   ??? Acquired hypothyroidism      hypothroidism   ??? Morbid obesity      No past surgical history on file.  Social History     Occupational History   ??? Not on file.     Social History Main Topics   ??? Smoking status: Never Smoker    ??? Smokeless tobacco: Not on file   ??? Alcohol Use: No   ??? Drug Use: No   ??? Sexually Active: Yes -- Female partner(s)     Birth Control/ Protection: None     No family history on file.    Allergies   Allergen Reactions   ??? Percocet [Oxycodone-Acetaminophen] Nausea and Vomiting     Prior to Admission medications    Medication Sig Start Date End Date Taking? Authorizing Provider   PRENATAL VIT/IRON FUMARATE/FA (RIGHT STEP PRENATAL VITAMINS PO) Take 1 tablet by mouth daily.   Yes Historical Provider        Review of Systems: A comprehensive review of systems was negative.    Objective:     Vitals:  Filed Vitals:    03/12/13 0809 03/12/13 0850   BP: 119/65    Pulse: 85    Height:  5\' 3"  (1.6 m)   Weight:  264 lb (119.75 kg)        Physical Exam:  Patient without distress.  Back: costovertebral angle tenderness absent  Abdomen: soft, nontender  Fundus: soft and non tender  Perineum: blood absent, amniotic fluid absent   Cervical Exam: 1 cm dilated    60% effaced    -3 station    Presenting Part: cephalic  Lower Extremities:  - No evidence of DVT seen on physical exam.  Membranes:  Intact  Fetal Heart Rate: Reactive  Baseline: 140 per minute  Accelerations: yes    Prenatal Labs:   Lab Results   Component Value Date/Time    ABO,Rh 0+ 08/27/2011    Rubella immune 08/03/2012    GrBS positive 08/27/2011    HBsAg negative 08/03/2012    HIV negative 08/03/2012    RPR nonreactive 08/03/2012    Gonorrhea negative 08/03/2012    Chlamydia negative 08/03/2012         Assessment/Plan:     Plan: Admit for Continue plan for vaginal  delivery.  Group B Strep was positive, will treat prophylactically with penicillin.    Signed By:  Basilia Jumbo, MD     March 12, 2013

## 2013-03-12 NOTE — Progress Notes (Signed)
Dr. Oredein at bedside.

## 2013-03-12 NOTE — Progress Notes (Signed)
SSE given with good results

## 2013-03-12 NOTE — Other (Addendum)
Assumed care of patient feeling pressure. Patient states she has to push.

## 2013-03-12 NOTE — Progress Notes (Signed)
SVE 10/100/+3. Dr. Johny Chess at bedside.

## 2013-03-12 NOTE — Progress Notes (Signed)
TOCO readjusted

## 2013-03-12 NOTE — Progress Notes (Signed)
Patient up to restroom

## 2013-03-12 NOTE — Progress Notes (Signed)
Dawson Bills at bedside pushing with patient.

## 2013-03-12 NOTE — Progress Notes (Signed)
Patient reports feeling the urge to push. SVE done. All RN can feel is bag of water. Called Dr. Johny Chess to advise. He is on his way.

## 2013-03-12 NOTE — Progress Notes (Signed)
Report given to J. Loretha Brasil.

## 2013-03-12 NOTE — Progress Notes (Signed)
Unable to assess baseline heart rate. Fetal heart rate audible.

## 2013-03-13 LAB — HEMATOCRIT: HCT: 27.1 % — ABNORMAL LOW (ref 35.0–45.0)

## 2013-03-13 LAB — HEMOGLOBIN: HGB: 9.3 g/dL — ABNORMAL LOW (ref 12.0–16.0)

## 2013-03-13 MED ORDER — RHO D IMMUNE GLOBULIN 300 MCG IM SYRG
1500 unit (300 mcg) | INTRAMUSCULAR | Status: DC | PRN
Start: 2013-03-13 — End: 2013-03-14

## 2013-03-13 MED ADMIN — methylergonovine (METHERGINE) 0.2 mg/mL (1 mL) injection 0.2 mg: INTRAMUSCULAR | @ 12:00:00 | NDC 17478050101

## 2013-03-13 MED ADMIN — acetaminophen (TYLENOL) tablet 650 mg: ORAL | @ 16:00:00 | NDC 50580050130

## 2013-03-13 MED ADMIN — butorphanol (STADOL) 1 mg/mL injection: INTRAVENOUS | @ 12:00:00 | NDC 00409162301

## 2013-03-13 MED ADMIN — lactated ringers infusion: INTRAVENOUS | @ 01:00:00 | NDC 00409795309

## 2013-03-13 MED ADMIN — oxytocin in lactated ringers 20 unit/1,000 mL infusion soln: @ 12:00:00 | NDC 61553076904

## 2013-03-13 MED ADMIN — penicillin G pot (PFIZERPEN) 2.5 Million Units in 50 ml 0.9% NaCl: INTRAVENOUS | @ 03:00:00 | NDC 99990008227

## 2013-03-13 MED ADMIN — HYDROcodone-acetaminophen (NORCO) 10-325 mg tablet 1 tablet: ORAL | @ 10:00:00 | NDC 51079077901

## 2013-03-13 MED ADMIN — ibuprofen (MOTRIN) 400 mg tablet: ORAL | @ 06:00:00 | NDC 00904585361

## 2013-03-13 MED ADMIN — HYDROcodone-acetaminophen (NORCO) 10-325 mg tablet 1 tablet: ORAL | @ 19:00:00 | NDC 51079077901

## 2013-03-13 MED ADMIN — oxytocin in lactated ringers 20 unit/1,000 mL infusion soln: INTRAVENOUS | @ 13:00:00 | NDC 61553076904

## 2013-03-13 MED ADMIN — fentaNYL-ropivacaine in NS (PF) 1.5 mcg/mL-0.2% epidural infusion: EPIDURAL | @ 04:00:00 | NDC 61553019052

## 2013-03-13 MED ADMIN — lactated ringers infusion: INTRAVENOUS | @ 01:00:00 | NDC 11845118709

## 2013-03-13 MED ADMIN — fentaNYL citrate (PF) injection 100 mcg: EPIDURAL | @ 04:00:00 | NDC 00409909422

## 2013-03-13 NOTE — Other (Signed)
Pushing with contractions

## 2013-03-13 NOTE — Progress Notes (Signed)
VSS. Lochia down to a decreased moderate to increased scant amount. Fundus firm at the umbilicus. Will continue to monitor.

## 2013-03-13 NOTE — Other (Signed)
Saline lock infiltrated and removed. Saline lock insert attempted per Stephens Shire, RN, without success.

## 2013-03-13 NOTE — Progress Notes (Signed)
Assisted to BR and voided large amount clear yellow urine. Peri care instructed/given. Assisted back to bed and having moderate rubra lochia with some small clots. Fundus massaged and firm at umbilicus. Dr. Willene Hatchet notified of same and order received to give 1000 ml of LR with 20 units pitocin X 1 liter. Will continue to monitor.

## 2013-03-13 NOTE — Progress Notes (Signed)
Post Procedure Note     Patient is s/p-Vaginal Delivery with Lumbar Epidural       Visit Vitals   Item Reading   ??? BP 126/74   ??? Pulse 61   ??? Temp 97.9 ??F (36.6 ??C)   ??? Resp 20   ??? Ht 5\' 3"  (1.6 m)   ??? Wt 119.75 kg (264 lb)   ??? BMI 46.78 kg/m2   ??? SpO2 100%   ??? Breastfeeding No     C/o of mild back pain    Mental status-Alert,oriented x 3    No apparent Nausea/vomiting.    Oxygenation-adequate.    Nerological status-grossly intact.    No apparent Anesthetic complications.        Leroy Libman, MD

## 2013-03-13 NOTE — Other (Signed)
Bedside and Verbal shift change report given to TRACEY M GARRETT, RN   (oncoming nurse) by Robin W RN (offgoing nurse). Report included the following information SBAR, Kardex and MAR.

## 2013-03-13 NOTE — Progress Notes (Signed)
Dr. Johny Chess present and new orders received. Methergine 0.2 mg IM administered as ordered.

## 2013-03-13 NOTE — Other (Signed)
TRANSFER - IN REPORT:    Verbal report received from Dorette Grate, RN, (name) on Linda [name]  being received from L&D (unit) for routine progression of care      Report consisted of patient???s Situation, Background, Assessment and   Recommendations(SBAR).     Information from the following report(s) SBAR, Kardex, Procedure Summary, Intake/Output, MAR and Recent Results was reviewed with the receiving nurse.    Opportunity for questions and clarification was provided.      Assessment completed upon patient???s arrival to unit and care assumed.

## 2013-03-13 NOTE — Progress Notes (Signed)
Continues to have a moderate amount of lochia. Bolus of 1000 ml LR with 20 units pitocin started as ordered.

## 2013-03-13 NOTE — Progress Notes (Addendum)
1610   Assumed care of pt. Azucena Cecil RN  At bedside. Dr Johny Chess here. .  More  blood clots evacuated. 570ml+. Fundus now firm at umb. Vs wnl. Advised pt to call if heavy bleeding returns and not to get oob without help. Nursing assessment completed.    0820   Called to pt room. Pt states feels funny after stadol. Feeling anxious when trying to eat. Tech remains at bedside giving reassurance. Vs wnl. o2 2l nc applied. Decreased moderate lochia flow. New pad applied.    0900    Pt resting with eyes closed. No distress noted.    1000    Awakened. Placed on bedpan. Voided qs bloody urine. Increased scant lochia rubra. Fundus firm at umb. New pad applied.    1200   Assisted to [facility]. Voided large amt. Denies dizziness. Scant lochia rubra. Assisted with am care. New pad applied.    1500   Visiting with family.    1530   Assisted to bathroom. Voided qs. Small vaginal spotting. Peri care per self. No dizziness when up.    1700  Sitting up in bed. Eating dinner.

## 2013-03-13 NOTE — Progress Notes (Signed)
Saline lock inserted by Arletha Pili, RN, # 20 left hand X 1 attempt.

## 2013-03-13 NOTE — Progress Notes (Signed)
Stadol 2 mg slow IV push given per Irven [place] RN, as ordered. Sterile vaginal exam per Dr. Johny Chess and he expressed several large clots with a greater than 500 ml blood loss. Stat H&H ordered.

## 2013-03-13 NOTE — Progress Notes (Signed)
RETURN OB VISIT SUMMARY    Subjective:     Pt denies complaints. Doing well.    Objective:   Physical Exam:  Patient Vitals for the past 8 hrs:   BP Temp Pulse Resp SpO2   03/14/13 0743 123/67 mmHg 97.5 ??F (36.4 ??C) 62 18 99 %   03/14/13 0524 133/65 mmHg 97.1 ??F (36.2 ??C) 71 20 98 %       Breast:  Non-Engorged  Lungs:clear  Uterine Fundus:firm  Abdomen:bowel sounds present and normal in all 4 quadrants  Incision:none  Lochia: appropriate     No evidence of DVT seen on physical exam.     Lab/Data Review:  All lab results for the last 24 hours reviewed.    Assessment/Plan:     Normal [place]  Continue present plan          Basilia Jumbo, MD  March 14, 2013

## 2013-03-13 NOTE — Other (Signed)
Crackers, juice and soup served

## 2013-03-13 NOTE — Other (Signed)
Patient up to bathroom. Peri-care given with instructions. Post-partum care given

## 2013-03-13 NOTE — Progress Notes (Signed)
Lochia has increased to heavy. Another call placed to Dr. Johny Chess. Fundus messaged and firm at umbilicus. Irven [place], RN, and Dana Allan, [place] tech present to assist.

## 2013-03-14 LAB — HEMOGLOBIN: HGB: 8 g/dL — ABNORMAL LOW (ref 12.0–16.0)

## 2013-03-14 LAB — HEMATOCRIT: HCT: 24.1 % — ABNORMAL LOW (ref 35.0–45.0)

## 2013-03-14 MED ADMIN — ferrous sulfate tablet 325 mg: ORAL | @ 18:00:00 | NDC 00603017955

## 2013-03-14 MED ADMIN — ibuprofen (MOTRIN) tablet 800 mg: ORAL | @ 17:00:00 | NDC 00904585361

## 2013-03-14 MED ADMIN — HYDROcodone-acetaminophen (NORCO) 10-325 mg tablet 1 tablet: ORAL | @ 12:00:00 | NDC 51079077901

## 2013-03-14 MED ADMIN — ibuprofen (MOTRIN) tablet 800 mg: ORAL | @ 04:00:00 | NDC 00904585361

## 2013-03-14 NOTE — Progress Notes (Addendum)
0800   Assumed care. Nursing assessment completed.    1325    Reviewed discharge instructions. Verbalized understanding. Copy given to pt.    1400    Discharged ambulatory.

## 2013-03-14 NOTE — Other (Signed)
VERBAL Bedside shift change report given to  Adalida Garver rnc  by Garrett RN.   Report given with SBAR, Kardex, MAR and Recent Results.
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
## 2013-04-16 LAB — METABOLIC PANEL, BASIC
Anion gap: 6 mmol/L (ref 3.0–18)
BUN/Creatinine ratio: 13 (ref 12–20)
BUN: 7 MG/DL (ref 7.0–18)
CO2: 26 mmol/L (ref 21–32)
Calcium: 8.9 MG/DL (ref 8.5–10.1)
Chloride: 108 mmol/L (ref 100–108)
Creatinine: 0.55 MG/DL — ABNORMAL LOW (ref 0.6–1.3)
GFR est AA: >60 mL/min/{1.73_m2} (ref >60)
GFR est non-AA: >60 mL/min/{1.73_m2} (ref >60)
Glucose: 94 mg/dL (ref 74–99)
Potassium: 3.5 mmol/L (ref 3.5–5.5)
Sodium: 140 mmol/L (ref 136–145)

## 2013-04-16 LAB — URINALYSIS W/ RFLX MICROSCOPIC
Bilirubin: NEGATIVE
Glucose: NEGATIVE mg/dL
Ketone: NEGATIVE mg/dL
Nitrites: NEGATIVE
Protein: NEGATIVE mg/dL
Specific gravity: 1.015 (ref 1.003–1.030)
Urobilinogen: 2 EU/dL — ABNORMAL HIGH (ref 0.2–1.0)
pH (UA): 7.5 (ref 5.0–8.0)

## 2013-04-16 LAB — CBC WITH AUTOMATED DIFF
ABS. BASOPHILS: 0 10*3/uL (ref 0.0–0.1)
ABS. EOSINOPHILS: 0.1 10*3/uL (ref 0.0–0.4)
ABS. LYMPHOCYTES: 1.7 10*3/uL (ref 0.9–3.6)
ABS. MONOCYTES: 0.3 10*3/uL (ref 0.05–1.2)
ABS. NEUTROPHILS: 3.1 10*3/uL (ref 1.8–8.0)
BASOPHILS: 0 % (ref 0–2)
EOSINOPHILS: 2 % (ref 0–5)
HCT: 30.8 % — ABNORMAL LOW (ref 35.0–45.0)
HGB: 10.6 g/dL — ABNORMAL LOW (ref 12.0–16.0)
LYMPHOCYTES: 33 % (ref 21–52)
MCH: 25.1 PG (ref 24.0–34.0)
MCHC: 34.4 g/dL (ref 31.0–37.0)
MCV: 73 FL — ABNORMAL LOW (ref 74.0–97.0)
MONOCYTES: 6 % (ref 3–10)
MPV: 9.4 FL (ref 9.2–11.8)
NEUTROPHILS: 59 % (ref 40–73)
PLATELET: 238 10*3/uL (ref 135–420)
RBC: 4.22 M/uL (ref 4.20–5.30)
RDW: 16.8 % — ABNORMAL HIGH (ref 11.6–14.5)
WBC: 5.2 10*3/uL (ref 4.6–13.2)

## 2013-04-16 LAB — HCG URINE, QL: HCG urine, QL: NEGATIVE

## 2013-04-16 LAB — URINE MICROSCOPIC ONLY: WBC: UNDETERMINED /hpf (ref 0–4)

## 2013-04-16 LAB — WET PREP

## 2013-04-16 MED ORDER — AMOXICILLIN 500 MG TABLET
500 mg | ORAL_TABLET | Freq: Three times a day (TID) | ORAL | Status: DC
Start: 2013-04-16 — End: 2014-02-14

## 2013-04-16 MED ADMIN — sodium chloride 0.9 % bolus infusion 1,000 mL: INTRAVENOUS | @ 16:00:00 | NDC 00409798309

## 2013-04-16 MED FILL — SODIUM CHLORIDE 0.9 % IV: INTRAVENOUS | Qty: 1000

## 2013-04-16 NOTE — ED Provider Notes (Signed)
Patient is a 23 y.o. female presenting with abdominal pain and vaginal bleeding.   Abdominal Pain     Vaginal Bleeding  Associated symptoms include abdominal pain.      23 yo female to the ER with c/o Lower abdominal cramping with vaginal bleeding x 2 days, pt reports that she had uncomplicated vaginal delivery 03/12/13 by Dr Johny Chess, pt reports that she was bleeding after delivery but stopped approx 1 week after delivery, pt has appt with Dr Johny Chess 04/22/13, pt denies any fever, nausea, vomiting, diarrhea, CP, SOB, HA, dizziness, throat closing, difficulty breathing/swallowing, hemoptysis, hematemesis, bloody stools, weakness, paresthesias or any other concerns, pt has taken no meds PTA, G2P2, pt reports no hx blood transfusion, no alleviating or aggravating factors        Past Medical History   Diagnosis Date   ??? Hypothyroidism    ??? Endocrine disease      hypothyroidism   ??? Acquired hypothyroidism      hypothroidism   ??? Morbid obesity         History reviewed. No pertinent past surgical history.      History reviewed. No pertinent family history.     History     Social History   ??? Marital Status: SINGLE     Spouse Name: N/A     Number of Children: N/A   ??? Years of Education: N/A     Occupational History   ??? Not on file.     Social History Main Topics   ??? Smoking status: Never Smoker    ??? Smokeless tobacco: Not on file   ??? Alcohol Use: No   ??? Drug Use: No   ??? Sexually Active: Yes -- Female partner(s)     Birth Control/ Protection: None     Other Topics Concern   ??? Not on file     Social History Narrative   ??? No narrative on file                  ALLERGIES: Percocet      Review of Systems   Gastrointestinal: Positive for abdominal pain.   Genitourinary: Positive for vaginal bleeding.     ROS   Constitutional:?? Denies malaise, fever, chills.   Head:?? Denies injury.   Face:?? Denies injury or pain.   ENMT:?? Denies sore throat.   Neck:?? Denies injury or pain.   Chest:?? Denies injury.   Cardiac:?? Denies chest pain or  palpitations.   Respiratory:?? Denies cough, wheezing, difficulty breathing, shortness of breath.   GI/ABD:?? see HPI  GU:?? see HPI  Back:?? Denies injury or pain.   Pelvis:?? Denies injury or pain.   Extremity/MS:?? Denies injury or pain.   Neuro:?? Denies headache, LOC, dizziness, neurologic symptoms/ deficits/ paresthesias.   Skin: Denies injury, rash, itching or skin changes.       Filed Vitals:    04/16/13 0958   BP: 150/81   Pulse: 73   Temp: 98.3 ??F (36.8 ??C)   Resp: 16   Height: 5\' 5"  (1.651 m)   Weight: 115.214 kg (254 lb)   SpO2: 97%            Physical Exam   Nursing note and vitals reviewed.  Constitutional: She is oriented to person, place, and time. She appears well-developed and well-nourished.   HENT:   Head: Normocephalic and atraumatic.   Right Ear: External ear normal.   Left Ear: External ear normal.   Nose: Nose normal.   Mouth/Throat:  Oropharynx is clear and moist.   +uvula midline, +able to handle oral secretions without difficulty, +airway patent   Eyes: Conjunctivae and EOM are normal. Pupils are equal, round, and reactive to light.   Neck: Normal range of motion. Neck supple.   Non-tender   Cardiovascular: Normal rate, regular rhythm and normal heart sounds.    Pulmonary/Chest: Effort normal and breath sounds normal. No respiratory distress. She has no wheezes. She has no rales.   Lungs CTA bilaterally, no wheezing, rhonchi or rales   Abdominal: Soft. Bowel sounds are normal. She exhibits no distension. There is no tenderness. There is no rebound and no guarding.   Positive BS noted, ABD soft with no tenderness, no rebound, guarding or rigidity noted   Genitourinary: Pelvic exam was performed with patient supine. There is no rash, tenderness or lesion on the right labia. There is no rash, tenderness or lesion on the left labia. Cervix exhibits no motion tenderness, no discharge and no friability. Right adnexum displays no mass, no tenderness and no fullness. Left adnexum displays no mass, no  tenderness and no fullness. There is bleeding around the vagina. No erythema or tenderness around the vagina. No foreign body around the vagina. No signs of injury around the vagina. No vaginal discharge found.   +chaperoned by ER RN, +approx 10 mL bright red blood noted in vaginal vault   Musculoskeletal:   No CVA tenderness   Neurological: She is alert and oriented to person, place, and time.   Skin: Skin is warm and dry.   Psychiatric: She has a normal mood and affect. Her behavior is normal.        MDM     Differential Diagnosis; Clinical Impression; Plan:     DDX: Abdominal pain, Pregnancy, Pancreatitis, Ectopic Pregnancy, Gastritis, AMI, Gastroenteritis, Colitis, Diverticulitis, Ovarian Cyst, Ovarian Torsion, Tubo-Ovarian Abscess, PID, PUD, Cholecystitis, Choledocholithiasis, Mesenteric Ischemia, Ectopic Pregnancy Rupture, Pelvic Pain Syndrome, Acute Cystitis, Pyelonephritis, Renal Colic, Biliary Colic, Perforation, Splenic Flexure Syndrome, Abdominal Aortic Aneurysm, Gastrointestinal Bleed, DUB, Products of conception, Endometritis, Vaginal bleeding, Threatened miscarriage, Miscarriage    Plan: pt presented to the ER with c/o Lower abdominal cramping with vaginal bleeding x 2 days, pt reports that she had uncomplicated vaginal delivery 03/12/13 by Dr Johny Chessredein, pt reports that she was bleeding after delivery but stopped approx 1 week after delivery, pt has appt with Dr Johny Chessredein 04/22/13, pt has stable vitals, afebrile, Positive BS noted, ABD soft with no tenderness no rebound, guarding or rigidity, pt has stable H/H 10.6/30.8, neg preg test, U/A with UTI, net wet prep, UItrasound with IMPRESSION:     No evidence for residual products of conception or hematoma post partum.     Sliver of fluid in the endocervical canal.     Normal ovaries and adnexa. Will discharge home with outpt GYN f/u  Amount and/or Complexity of Data Reviewed:   Clinical lab tests:  Ordered and reviewed  Tests in the radiology section of CPT??:   Ordered and reviewed  Discussion of test results with the performing providers:  No   Decide to obtain previous medical records or to obtain history from someone other than the patient:  Yes   Obtain history from someone other than the patient:  No   Review and summarize past medical records:  Yes   Discuss the patient with another provider:  No   Independant visualization of image, tracing, or specimen:  Yes  Risk of Significant Complications, Morbidity, and/or Mortality:   Presenting  problems:  Low  Diagnostic procedures:  Low  Management options:  Low  Progress:   Patient progress:  Stable      Procedures    Pelvic Exam:    Speculum and bimanual pelvic exam performed with assistance of ER RN. Normal exam of external labia majora and minora, vagina, cervix, uterus size and bilateral ovaries  Patient tolerated procedure well without excessive bleeding or pain. Samples obtained, labeled at bedside and sent to lab.      Progress Note    Patient is improved, resting quietly and comfortably, all questions were addressed/answered, will continue to monitor closely    Zella Richer, PA  April 16, 2013  10:35 AM      Progress Note    Patient is improved, resting quietly and comfortably, pt has returned from ultrasound, no needs or complaints, pt is pending ultrasound results, will continue to monitor closely    Zella Richer, PA  April 16, 2013  11:58 AM    Progress Note    Patient is improved, resting quietly and comfortably, pt continues to wait for ultrasound results, all questions were addressed/answered, will continue to monitor closely    Lonell Stamos, PA  April 16, 2013  1:13 PM    Progress Note    Patient is improved, resting quietly and comfortably, The patient will be discharged home.  The patient was reassured that these symptoms do not appear to represent a serious or life threatening condition at this time.  Warning signs of worsening condition were discussed and understood by the patient.  Based on  patient's age, coexisting illness, exam, and the results of this ED evaluation, the decision to treat as an outpatient was made.  Based on the information available at time of discharge, acute pathology requiring immediate intervention was deemed relative unlikely.  While it is impossible to completely exclude the possibility of underlying serious disease or worsening of condition, I feel the relative likelihood is extremely low.  I discussed this uncertainty with the patient, who understood ED evaluation and treatment and felt comfortable with the outpatient treatment plan.  All questions regarding care, test results, and follow up were answered.  The patient is stable and appropriate to discharge.  They understand that they should return to the emergency department for any new or worsening symptoms.  I stressed the importance of follow up for repeat assessment and possibly further evaluation/treatment.      Clella Mckeel, PA  April 16, 2013  1:24 PM

## 2013-04-16 NOTE — ED Provider Notes (Signed)
I was personally available for consultation in the emergency department.  I have reviewed the chart and agree with the documentation recorded by the MLP, including the assessment, treatment plan, and disposition.  SANTOS Quaran Kedzierski DOS, DO

## 2013-04-16 NOTE — ED Notes (Signed)
Pt. States she  Had lower abdominal pain, vaginal bleeding started yesterday,  Had delivered  Last Dec,  26,  2014

## 2013-04-19 LAB — CHLAMYDIA/GC PCR
Chlamydia amplified: NEGATIVE
N. gonorrhea, amplified: NEGATIVE

## 2013-06-04 LAB — URINALYSIS W/ RFLX MICROSCOPIC
Bilirubin: NEGATIVE
Blood: NEGATIVE
Glucose: NEGATIVE mg/dL
Ketone: NEGATIVE mg/dL
Nitrites: NEGATIVE
Protein: NEGATIVE mg/dL
Specific gravity: >1.03 — ABNORMAL HIGH (ref 1.003–1.030)
Urobilinogen: 0.2 EU/dL (ref 0.2–1.0)
pH (UA): 6 (ref 5.0–8.0)

## 2013-06-04 LAB — URINE MICROSCOPIC ONLY
RBC: 0 /hpf (ref 0–5)
WBC: 15 /hpf (ref 0–4)

## 2013-06-04 LAB — HCG URINE, QL: HCG urine, QL: NEGATIVE

## 2013-06-04 NOTE — ED Provider Notes (Signed)
HPI Comments: Linda [name] is a 23 y.o. Female with pmhx of hypothyroidism and morbid obesity who reports to the ED with c/o abdominal pain. Pt notes that her pain is a "sharp pain" and is diffuse throughout her abdomen. Pt states that she believes she could be pregnant. Pt denies vaginal bleeding.       Patient is a 23 y.o. female presenting with abdominal pain and back pain. The history is provided by the patient.   Abdominal Pain   Associated symptoms include back pain.   Back Pain   Associated symptoms include abdominal pain.        Past Medical History   Diagnosis Date   ??? Hypothyroidism    ??? Endocrine disease      hypothyroidism   ??? Acquired hypothyroidism      hypothroidism   ??? Morbid obesity         History reviewed. No pertinent past surgical history.      History reviewed. No pertinent family history.     History     Social History   ??? Marital Status: SINGLE     Spouse Name: N/A     Number of Children: N/A   ??? Years of Education: N/A     Occupational History   ??? Not on file.     Social History Main Topics   ??? Smoking status: Never Smoker    ??? Smokeless tobacco: Not on file   ??? Alcohol Use: No   ??? Drug Use: No   ??? Sexual Activity:     Partners: Male     Pharmacist, hospitalBirth Control/ Protection: None     Other Topics Concern   ??? Not on file     Social History Narrative                  ALLERGIES: Percocet      Review of Systems   Gastrointestinal: Positive for abdominal pain.   Musculoskeletal: Positive for back pain.       Filed Vitals:    06/04/13 0122   BP: 146/76   Pulse: 75   Temp: 98.6 ??F (37 ??C)   SpO2: 100%            Physical Exam     MDM  Number of Diagnoses or Management Options  Abdominal pain, generalized:   Diagnosis management comments: Linda [name] is a 23 y.o. Female who reports to the ED with c/o abdominal discomfort stating that she has had similar discomfort with previous pregnancies. Pt notes that she is concerned that she might be pregnant. Exam was reassuring. Routine labs ordered. Anticipate  discharge home.     Ddx: Abdominal pain, pregnancy, UTI, viral syndrome.          Amount and/or Complexity of Data Reviewed  Clinical lab tests: ordered and reviewed        Procedures    Medications ordered:   Medications - No data to display      Lab findings:  Labs Reviewed   URINALYSIS W/ RFLX MICROSCOPIC - Abnormal; Notable for the following:     Specific gravity >1.030 (*)     Leukocyte Esterase SMALL (*)     All other components within normal limits   URINE MICROSCOPIC ONLY - Abnormal; Notable for the following:     Bacteria 1+ (*)     Mucus 1+ (*)     All other components within normal limits   HCG URINE, QL  EKG interpretation:    X-Ray, CT or other radiology findings or impressions:  No orders to display       Progress notes, Consult notes or additional Procedure notes:     Reevaluation of patient:       Dispo:  Patient left AMA before any discharge instructions. No verbal instructions given by MD as informed patient had left before I could speak to her.      Scribe Attestation Statement:   Michae Kava 06/04/2013 2:46 AM scribing for and in the presence of Dr.Rowan Pollman A Earlene Plater, MD     Michae Kava, Scribe      Provider Attestation:   I personally performed the services described in the documentation, reviewed the documentation, as recorded by the scribe in my presence, and it accurately and completely records my words and actions. 5:38 AM 06/04/2013 - Rhona Leavens, MD

## 2013-06-04 NOTE — ED Notes (Signed)
Per pt, "I have sharp pains in my stomach and my low back is killing me."

## 2013-06-04 NOTE — ED Notes (Signed)
Pt aox3. Pt c/o lower abd and bilateral flank pain. Pt describes pain as sharp pain. Pt states I had this when I was pregnant. Pt states chance she could be pregnant. Pt denies vaginal bleeding. Pt states last BM was yesterday.

## 2013-06-04 NOTE — ED Notes (Signed)
Pt states she has to leave. Pt sates she will be back later. Provider made aware.

## 2013-07-02 LAB — CBC WITH AUTOMATED DIFF
ABS. BASOPHILS: 0 10*3/uL (ref 0.0–0.06)
ABS. EOSINOPHILS: 0.1 10*3/uL (ref 0.0–0.4)
ABS. LYMPHOCYTES: 2.9 10*3/uL (ref 0.9–3.6)
ABS. MONOCYTES: 0.4 10*3/uL (ref 0.05–1.2)
ABS. NEUTROPHILS: 1.6 10*3/uL — ABNORMAL LOW (ref 1.8–8.0)
BASOPHILS: 0 % (ref 0–2)
EOSINOPHILS: 1 % (ref 0–5)
HCT: 32 % — ABNORMAL LOW (ref 35.0–45.0)
HGB: 10.5 g/dL — ABNORMAL LOW (ref 12.0–16.0)
LYMPHOCYTES: 59 % — ABNORMAL HIGH (ref 21–52)
MCH: 23.4 PG — ABNORMAL LOW (ref 24.0–34.0)
MCHC: 32.8 g/dL (ref 31.0–37.0)
MCV: 71.4 FL — ABNORMAL LOW (ref 74.0–97.0)
MONOCYTES: 8 % (ref 3–10)
MPV: 9.1 FL — ABNORMAL LOW (ref 9.2–11.8)
NEUTROPHILS: 32 % — ABNORMAL LOW (ref 40–73)
PLATELET: 251 10*3/uL (ref 135–420)
RBC: 4.48 M/uL (ref 4.20–5.30)
RDW: 17.1 % — ABNORMAL HIGH (ref 11.6–14.5)
WBC: 4.9 10*3/uL (ref 4.6–13.2)

## 2013-07-02 LAB — METABOLIC PANEL, COMPREHENSIVE
A-G Ratio: 0.9 (ref 0.8–1.7)
ALT (SGPT): 29 U/L (ref 13–56)
AST (SGOT): 29 U/L (ref 15–37)
Albumin: 3.2 g/dL — ABNORMAL LOW (ref 3.4–5.0)
Alk. phosphatase: 77 U/L (ref 45–117)
Anion gap: 5 mmol/L (ref 3.0–18)
BUN/Creatinine ratio: 18 (ref 12–20)
BUN: 11 MG/DL (ref 7.0–18)
Bilirubin, total: 0.2 MG/DL (ref 0.2–1.0)
CO2: 25 mmol/L (ref 21–32)
Calcium: 8.4 MG/DL — ABNORMAL LOW (ref 8.5–10.1)
Chloride: 111 mmol/L — ABNORMAL HIGH (ref 100–108)
Creatinine: 0.62 MG/DL (ref 0.6–1.3)
GFR est AA: >60 mL/min/{1.73_m2} (ref >60)
GFR est non-AA: >60 mL/min/{1.73_m2} (ref >60)
Globulin: 3.4 g/dL (ref 2.0–4.0)
Glucose: 86 mg/dL (ref 74–99)
Potassium: 3.7 mmol/L (ref 3.5–5.5)
Protein, total: 6.6 g/dL (ref 6.4–8.2)
Sodium: 141 mmol/L (ref 136–145)

## 2013-07-02 LAB — URINALYSIS W/ RFLX MICROSCOPIC
Bilirubin: NEGATIVE
Blood: NEGATIVE
Glucose: NEGATIVE mg/dL
Ketone: NEGATIVE mg/dL
Nitrites: NEGATIVE
Protein: NEGATIVE mg/dL
Specific gravity: 1.025 (ref 1.003–1.030)
Urobilinogen: 1 EU/dL (ref 0.2–1.0)
pH (UA): 6.5 (ref 5.0–8.0)

## 2013-07-02 LAB — HCG URINE, QL: HCG urine, QL: NEGATIVE

## 2013-07-02 LAB — URINE MICROSCOPIC ONLY
RBC: NEGATIVE /hpf (ref 0–5)
WBC: 7 /hpf (ref 0–4)

## 2013-07-02 MED ORDER — KETOROLAC TROMETHAMINE 30 MG/ML INJECTION
30 mg/mL (1 mL) | INTRAMUSCULAR | Status: AC
Start: 2013-07-02 — End: 2013-07-02
  Administered 2013-07-02: 12:00:00 via INTRAVENOUS

## 2013-07-02 MED ORDER — CIPROFLOXACIN 500 MG TAB
500 mg | ORAL_TABLET | Freq: Two times a day (BID) | ORAL | Status: AC
Start: 2013-07-02 — End: 2013-07-09

## 2013-07-02 MED ORDER — ONDANSETRON HCL 4 MG TAB
4 mg | ORAL_TABLET | Freq: Three times a day (TID) | ORAL | Status: DC | PRN
Start: 2013-07-02 — End: 2014-02-14

## 2013-07-02 MED ORDER — ONDANSETRON (PF) 4 MG/2 ML INJECTION
4 mg/2 mL | INTRAMUSCULAR | Status: AC
Start: 2013-07-02 — End: 2013-07-02
  Administered 2013-07-02: 12:00:00 via INTRAVENOUS

## 2013-07-02 MED ORDER — IBUPROFEN 600 MG TAB
600 mg | ORAL_TABLET | Freq: Four times a day (QID) | ORAL | Status: DC | PRN
Start: 2013-07-02 — End: 2014-02-14

## 2013-07-02 MED FILL — ONDANSETRON HCL 4 MG/2 ML INTRAVENOUS SYRINGE: 4 mg/2 mL | INTRAVENOUS | Qty: 2

## 2013-07-02 MED FILL — KETOROLAC TROMETHAMINE 30 MG/ML INJECTION: 30 mg/mL (1 mL) | INTRAMUSCULAR | Qty: 1

## 2013-07-02 NOTE — ED Notes (Signed)
Sharp pains in stomach and back with nausea that started at the beginning of the month.

## 2013-07-02 NOTE — ED Provider Notes (Addendum)
Patient is a 23 y.o. female presenting with back pain and nausea.   Back Pain     Nausea          23 yo female to the ER with c/o Generalized abdominal pain with nausea, dysuria, urinary frequency and lower back pain x 1 month, pt reports that she was here last week for same s/s but left prior to completion of her evaluation, pt denies any fever, nausea, vomiting, diarrhea, CP, SOB, HA, dizziness, throat closing, difficulty breathing/swallowing, weakness, paresthesias, injury/surgery/trauma, vaginal complaints or any other concerns, pt reports no alleviating or aggravating factors, pt didn't drive to the ER, pt reports no recent unprotected sexual intercourse, G2P2, LMP 3 weeks ago.       Past Medical History   Diagnosis Date   ??? Hypothyroidism    ??? Endocrine disease      hypothyroidism   ??? Acquired hypothyroidism      hypothroidism   ??? Morbid obesity (HCC)         History reviewed. No pertinent past surgical history.      History reviewed. No pertinent family history.     History     Social History   ??? Marital Status: SINGLE     Spouse Name: N/A     Number of Children: N/A   ??? Years of Education: N/A     Occupational History   ??? Not on file.     Social History Main Topics   ??? Smoking status: Never Smoker    ??? Smokeless tobacco: Not on file   ??? Alcohol Use: No   ??? Drug Use: No   ??? Sexual Activity:     Partners: Male     Pharmacist, hospital Protection: None     Other Topics Concern   ??? Not on file     Social History Narrative                  ALLERGIES: Percocet      Review of Systems   Gastrointestinal: Positive for nausea.   Musculoskeletal: Positive for back pain.     ROS   Constitutional:?? Denies malaise, fever, chills.   Head:?? Denies injury.   Face:?? Denies injury or pain.   ENMT:?? Denies sore throat.   Neck:?? Denies injury or pain.   Chest:?? Denies injury.   Cardiac:?? Denies chest pain or palpitations.   Respiratory:?? Denies cough, wheezing, difficulty breathing, shortness of breath.   GI/ABD:?? see HPI  GU:??see HPI   Back:?? see HPI  Pelvis:?? Denies injury or pain.   Extremity/MS:?? Denies injury or pain.   Neuro:?? Denies headache, LOC, dizziness, neurologic symptoms/ deficits/ paresthesias.   Skin: Denies injury, rash, itching or skin changes.       Filed Vitals:    07/02/13 0615   BP: 151/92   Pulse: 76   Temp: 99.7 ??F (37.6 ??C)   Resp: 18   Height: 5\' 4"  (1.626 m)   Weight: 108.863 kg (240 lb)   SpO2: 97%            Physical Exam   Constitutional: She is oriented to person, place, and time. She appears well-developed and well-nourished.   HENT:   Head: Normocephalic and atraumatic.   Right Ear: External ear normal.   Left Ear: External ear normal.   Nose: Nose normal.   Mouth/Throat: Oropharynx is clear and moist.   +uvula midline, +able to handle oral secretions without difficulty, +airway patent   Eyes: Conjunctivae and  EOM are normal. Pupils are equal, round, and reactive to light.   Neck: Normal range of motion. Neck supple.   Non-tender   Cardiovascular: Normal rate, regular rhythm and normal heart sounds.    Pulmonary/Chest: Effort normal and breath sounds normal. No respiratory distress. She has no wheezes. She has no rales.   Lungs CTA bilaterally, no wheezing, rhonchi or rales   Abdominal: Soft. Bowel sounds are normal. She exhibits no distension. There is no tenderness. There is no rebound and no guarding.   Positive BS noted, ABD soft with no tenderness, no rebound, guarding or rigidity noted   Musculoskeletal:   No CVA tenderness   Neurological: She is alert and oriented to person, place, and time.   Skin: Skin is warm and dry.   Psychiatric: She has a normal mood and affect. Her behavior is normal.   Nursing note and vitals reviewed.       MDM  Number of Diagnoses or Management Options  Abdominal pain, generalized:   Nausea alone:   Urinary tract infection without hematuria, site unspecified:   Diagnosis management comments: DDX: Abdominal pain, Pregnancy, Pancreatitis, Ectopic Pregnancy, Gastritis, AMI,  Gastroenteritis, Colitis, Diverticulitis, Ovarian Cyst, Ovarian Torsion, Tubo-Ovarian Abscess, PID, PUD, Cholecystitis, Choledocholithiasis, Mesenteric Ischemia, Ectopic Pregnancy Rupture, Pelvic Pain Syndrome, Acute Cystitis, Pyelonephritis, Renal Colic, Biliary Colic, Perforation, Splenic Flexure Syndrome, Abdominal Aortic Aneurysm, Gastrointestinal Bleed.     DDX: Sciatica, Lumbar radiculopathy, Back Sprain, Back Strain, DJD, HNP, Epidural abscess, Cauda Equina Syndrome, Contusion, Pyelonephritis, Renal Colic, Rib Fracture, Rib Contusion, Costochondritis, Pleurisy, Pleurodynia, Pneumothorax, Thoracic Aneurysm, Abdominal Aortic Aneurysm, Nephro-ureteral Calculus, Acute Myocardial Infarction.     Plan: Pt presented  to the ER with c/o Generalized abdominal pain with nausea, dysuria, urinary frequency and lower back pain x 1 month, pt reports that she was here last week for same s/s but left prior to completion of her evaluation, no alleviating or aggravating factors, G2P2, LMP 3 weeks ago, pt has stable vitals, afebrile, soft nontender abdomen, no CVA tenderness, will obtain U/A, urine preg, basic labs, IV pain meds and antiemetic and monitor closely    Pt reports feeling better after meds in the ER, U/A with UTI, neg preg test, urine culture was sent, stable labs, soft nontender abdomen, no CVA tenderness, will discharge home with ABX, pyridium, zofran and outpt f/u       Amount and/or Complexity of Data Reviewed  Clinical lab tests: ordered and reviewed  Decide to obtain previous medical records or to obtain history from someone other than the patient: yes  Review and summarize past medical records: yes    Risk of Complications, Morbidity, and/or Mortality  Presenting problems: low  Diagnostic procedures: low  Management options: low    Patient Progress  Patient progress: stable      Procedures    Progress Note    Patient is resting quietly, no needs or complaints, all questions were addressed/answered, will  continue to monitor closely    Zella RicherEmily Slade, PA  July 02, 2013  6:46 AM    Progress Note    Patient is improved, resting quietly and comfortably, The patient will be discharged home.  The patient was reassured that these symptoms do not appear to represent a serious or life threatening condition at this time.  Warning signs of worsening condition were discussed and understood by the patient.  Based on patient's age, coexisting illness, exam, and the results of this ED evaluation, the decision to treat as an outpatient was  made.  Based on the information available at time of discharge, acute pathology requiring immediate intervention was deemed relative unlikely.  While it is impossible to completely exclude the possibility of underlying serious disease or worsening of condition, I feel the relative likelihood is extremely low.  I discussed this uncertainty with the patient, who understood ED evaluation and treatment and felt comfortable with the outpatient treatment plan.  All questions regarding care, test results, and follow up were answered.  The patient is stable and appropriate to discharge.  They understand that they should return to the emergency department for any new or worsening symptoms.  I stressed the importance of follow up for repeat assessment and possibly further evaluation/treatment.      Zella RicherEmily Slade, PA  July 02, 2013  7:57 AM        I was personally available for consultation in the emergency department.  I have reviewed the chart prior to the patient being discharged and agree with the documentation recorded by the [facility], including the assessment, treatment plan, and disposition.  Awilda BillMark D Pietro Bonura, MD

## 2013-07-02 NOTE — ED Notes (Signed)
Pt discharged awake, alert, ambulatory with instructions and Rxs. Verbalizes understanding. Questions answered.

## 2013-07-02 NOTE — ED Notes (Signed)
Note IV 20 Ga right AC in  place. Discontinued prior to discharge with catheter intact.

## 2013-07-02 NOTE — ED Notes (Signed)
Low back x 2 weeks and nausea x 1 week

## 2013-07-02 NOTE — ED Notes (Signed)
[facility] [phone]. Pt sleeping; easily awakened with verbal stimuli. C/o pain in back and abd continuing. Pt medicated as ordered. Waiting on disposition.

## 2013-07-03 LAB — CULTURE, URINE
Culture result:: >100000
Culture: >100000

## 2013-07-18 NOTE — ED Provider Notes (Signed)
[facility] REPORT  NAME:  Linda RenderMCLAURIN, Linda  SEX:   F  ADMIT: 07/18/2013  DOB:   March 30, 1990  MR#    [id number]  ROOM:    TIME DICTATED: 03 25 AM  ACCT#  [id number]        TIME OF SERVICE:   0211.     CHIEF COMPLAINT:  Abdominal pain.    HISTORY OF PRESENT ILLNESS:  The patient is a 23 year old female who presents complaining of lower  abdominal cramping over the last 24 hours.  The patient's last normal  menstrual cycle was 03/28.  She had a positive pregnancy test 3 days ago.  Denies vomiting, diarrhea.  Has had some intermittent nausea for the last  several days.  Denies urinary complaints including dysuria, urinary frequency  or hematuria.  Denies any vaginal bleeding or discharge.  The patient is a  gravida 1, P0.    REVIEW OF SYSTEMS:  CONSTITUTIONAL:  No fever or chills.  EYES:   No visual symptoms.  ENT:  No sore throat, runny nose, or other URI symptoms.  HEMATOLOGIC:  No bleeding or bruising.    RESPIRATORY:  No cough, shortness of breath, or wheezing.  CARDIOVASCULAR:  No chest pain, chest pressure, or palpitations.  GASTROINTESTINAL:  Lower abdominal cramping with nausea.  No vomiting or  diarrhea.  GENITOURINARY:  No dysuria, frequency, or urgency.  MUSCULOSKELETAL:  No joint pain or swelling.  INTEGUMENTARY:  No rashes.  NEUROLOGICAL:  No headaches, sensory or motor symptoms.    PAST MEDICAL HISTORY:  History of manic depression, bipolar, ADHD, chronic kidney infections.    CURRENT MEDICATIONS:  None.    ALLERGIES:  PERCOCET.    SOCIAL HISTORY:  The patient has a former history of alcohol and marijuana use.  States she  recently stopped this since finding out she was pregnant.  Denies any tobacco  use.    PHYSICAL EXAMINATION:  VITAL SIGNS:  Blood pressure is 138/80, pulse 83, respirations 18, temperature  98.4 orally, pain 10 out of 10, O2 saturation 96% on room air.  GENERAL APPEARANCE:  The patient appears well-developed, well-nourished.  She  is alert.    HEENT:  Eyes:  Conjunctivae clear, lids normal.  Pupils equal, symmetrical,  and normally reactive.  Mouth/Throat:  Surfaces of the pharynx, palate, and  tongue are pink, moist, and without lesions.   NECK:  Supple, nontender, symmetrical, no masses or JVD, trachea midline,  thyroid not enlarged, nodular, or tender.   No cervical or submandibular  lymphadenopathy palpated.   RESPIRATORY:  Clear and equal breath sounds.  No respiratory distress,  tachypnea, or accessory muscle use.    CARDIOVASCULAR:  Heart regular, without murmurs, gallops, rubs, or thrills.   GASTROINTESTINAL:  Abdomen soft, obese, nondistended.  Mild tenderness to  palpation in the bilateral lower quadrants.  No rebound or guarding.  The rest  of the abdomen is nontender to palpation.  No abdominal masses appreciated by  inspection or palpation.  No cva tenderness noted bilaterally.  Bowel sounds  are present.  SKIN:  Warm and dry without rashes.   NEUROLOGICAL:  The patient alert and oriented times 3.  No focal deficits   noted.    INITIAL ASSESSMENT:  A 23 year old female with suspected pregnancy, presents for evaluation of  lower abdominal cramping.  The patient is afebrile.  Her vitals are  hemodynamically stable.  Again, she denies any vaginal bleeding at this time.  We will assess  a urine pregnancy and urinalysis as well perform a bedside  pelvic exam at this time.    CONTINUATION BY Orma FlamingOBERT Kehinde Totzke, MD:    INITIAL ASSESSMENT AND MANAGEMENT PLAN:  This is a new problem for this patient.     DIAGNOSTIC STUDIES:  The following were obtained:  Urinalysis shows trace blood, small leukocyte  esterase.  Pregnancy test was positive.  Urine shows Trichomonas and 1 to 4  white blood cells and greater than 50 epithelial cells.  Beta hCG was 471 and  wet prep shows clue cells, no yeast and no Trichomonas.    EMERGENCY DEPARTMENT COURSE:  The patient has been totally stable through her stay in the Emergency   Department.  I performed a transvaginal ultrasound.  Please see the procedure  note.  At this point, the patient is stable.  We are going to hold off  treating the Trichomonas.  Given that the patient is pregnant, I believe that  this can be left up to the obstetrician/gynecologist to follow up on.  The  patient does not have an IUP identified.  Her quantitative hCG is quite low  and so it is still possible she may be very early in the process.  At any  rate, we have advised her she must follow up in the next 48 to 72 hours with  Dr. Doloris HallFred Williams for a recheck of her quantitative hCG and further  evaluation.  She has been advised to come back immediately for worsening pain,  fever or vomiting.    PROCEDURE:  Focused Emergency Department ultrasound performed by Dr. Truddie CrumbleStambaugh with  indication of evaluating for evidence of viable intrauterine pregnancy.  Endovaginal probe inserted revealing an endometrial stripe noted.  There was  suggestion of a gestational sac, but without a fetal pole at this point.  Images were saved on the SonoSite machine.      CLINICAL IMPRESSION AND DIAGNOSES:  1.  Early indeterminate pregnancy.  2.  Vaginal Trichomonas.  3.  Pelvic pain.    DISPOSITION AND PLAN:  The patient was discharged in stable condition.  Advised to follow up with Dr.  Doloris HallFred Williams, as above.  Advised to come back for worsening pain, fever or  vomiting.  The patient understands that she may have an ectopic pregnancy, at  this point, it is uncertain; although her abdomen is benign.  She does  understand that she must follow up.      ___________________  Posey Prontoobert E Hailey Stormer MD  Dictated By: Truddie CrumbleAshley D. Cathlyn ParsonsBlalock, PA-C    My signature above authenticates this document and my orders, the final  diagnosis (es), discharge prescription (s), and instructions in the [facility] record.  Nursing notes have been reviewed by the physician/mid-level provider.    If you have any questions please contact [phone].    JMB   D:07/18/2013 03:25:33  T: 07/18/2013 13:47:08  [id number]  Electronically Authenticated and Edited by:  Posey Prontoobert E. Kendan Cornforth, M.D. On 07/19/2013 09:00 AM EDT

## 2013-08-03 NOTE — ED Provider Notes (Signed)
[facility]  EMERGENCY DEPARTMENT TREATMENT REPORT  NAME:  Linda [name]  SEX:   F  ADMIT: 08/03/2013  DOB:   February 07, 1991  MR#    [id number]  ROOM:    TIME DICTATED: 11 37 AM  ACCT#  [id number]    cc: Doloris Hall MD    PRIMARY CARE PHYSICIAN:  Mayford Knife.    CHIEF COMPLAINT:  Pregnant, with pain.    HISTORY OF PRESENT ILLNESS:  A 23 year old female who is G3, P2 who comes in with epigastric abdominal pain  and a slight right lower quadrant abdominal pain that she describes as  intermittent and sharp.  She has been experiencing myalgias, she felt a little  lightheaded and she noted some vaginal spotting and this all occurred within  the last 24 hours.  She was seen in our Emergency Department on 05/07 where  she did not have a confirmed intrauterine pregnancy.  She has not had a bowel  movement in the last 3 to 4 days.  She denies any dysuria, vomiting.  She  denies any URI symptoms.  She denies any other alleviating or aggravating  factors associated with her condition.    REVIEW OF SYSTEMS:    CONSTITUTIONAL:  No fever, chills, or weight loss.  EYES:   No visual symptoms.  ENT:  No sore throat, runny nose, or other URI symptoms.  RESPIRATORY:  No cough, shortness of breath, or wheezing.  CARDIOVASCULAR:  No chest pain, chest pressure, or palpitations.  GASTROINTESTINAL:  Abdominal  pain.  Denies vomiting, diarrhea.  GENITOURINARY:  No dysuria, frequency, or urgency.  MUSCULOSKELETAL:  No joint pain or swelling.  INTEGUMENTARY:  No rashes.  NEUROLOGICAL:  No headaches, sensory or motor symptoms.    PAST MEDICAL HISTORY:  Hypothyroidism, kidney infections, manic depression, bipolar, ADHD.    SOCIAL HISTORY:  Denies alcohol, tobacco and drug use.    FAMILY HISTORY:  Noncontributory.    CURRENT MEDICATIONS:  None.    ALLERGIES:  PERCOCET.    PHYSICAL EXAMINATION:  VITAL SIGNS:  Blood pressure 142/79, pulse 98, respirations 18, temperature is  99, pain is 8 out 10, O2 saturations 99% on room air.   GENERAL APPEARANCE:  Patient appears well developed and well nourished.  Appearance and behavior are age and situation appropriate.    HEENT:  Eyes:  Conjunctivae clear, lids normal.  Pupils equal, symmetrical,  and normally reactive.  Mouth/Throat:  Surfaces of the pharynx, palate, and  tongue are pink, moist, and without lesions.    NECK:  Supple, nontender, symmetrical, no masses or JVD, trachea midline,  thyroid not enlarged, nodular or tender.    LYMPHATIC:  No cervical or submandibular lymphadenopathy palpated.  RESPIRATORY:  Clear and equal breath sounds.  No respiratory distress,  tachypnea, or accessory muscle use.    CARDIOVASCULAR:   Heart regular, without murmurs, gallops, rubs, or thrills.    CHEST:  Symmetrical without masses or tenderness.  GASTROINTESTINAL:  The abdomen is soft, tender to palpation in the  epigastrium.  No abdominal or inguinal masses appreciated by inspection or  palpation.    GENITALIA:  The external genitalia are without swelling, lesions or discharge.  Vaginal walls have no bulging or lesions.  Cervix is pink with no lesions.  There was a yellow green-like discharge noted.  There is no pain on cervical  motion.  Uterus was movable without mass or tenderness.  No adnexal mass or  tenderness noted.  MUSCULOSKELETAL:  Stance and gait appear normal.  SKIN:  Warm and dry without rashes.    INITIAL ASSESSMENT AND MANAGEMENT PLAN:  A 23 year old female who is currently pregnant with epigastric abdominal pain  and an episode of right lower quadrant abdominal pain.  We will perform a  bedside ultrasound to confirm intrauterine pregnancy.  We will get a beta  quantitative, basic labs, urine, obtain a chest x-ray and reevaluate.    PROCEDURE:  Focused emergency department ultrasound was performed by Zola ButtonNichole Rice, Dr.  Erlinda Hongodd Vanden Hoek with indication for evaluating for evidence of viable  intrauterine pregnancy.  Endovaginal probe was inserted, revealing   intrauterine pregnancy noted be 7 weeks 4 days.  Heart rate was 130.  Image of  the ultrasound findings were maintained within the ED record.  There was no  free fluid noted.  There are no adnexal masses noted.  Blood type was O  positive.  CMP, lipase and CBC were all unremarkable.  Wet prep demonstrates  clue cells, no yeast or trichomonas.  Chest x-ray was negative.    CLINICAL COURSE:  During the patient's stay in the Emergency Department, she did not develop any  new or worsening symptoms, remained stable.  She was medicated with oral  Carafate, IV Pepcid.  She was given a liter of normal saline for hydration.  She was slightly tachycardic when she initially came in.  I suspected that she  was dry.  A chest x-ray was noted to rule out infection, as the patient was  having epigastric abdominal pain.  She looks well and nontoxic.  It is unclear  specifically what the cause of her epigastric pain is.  It might likely be  gastritis, but we do not suspect an infectious or vascular etiology requiring  advanced imaging at this time.  She has an intrauterine pregnancy.  We will  have her follow up with Dr. Mayford KnifeWilliams next week.  She was noted to have clue  cells.  We will recommend that Dr. Mayford KnifeWilliams treat her bacterial vaginosis and  will send her home with Carafate.    CLINICAL IMPRESSION AND DIAGNOSES:  1.  First trimester pregnancy.  2.  Epigastric abdominal pain.    DISPOSITION AND PLAN:  The patient is discharged home in stable condition with discharge instructions  on the same.  She is to follow up with Dr. Mayford KnifeWilliams as discussed.  Return to  the ER if condition worsens or new symptoms develop and a prescription for  Carafate was given.  The patient was personally evaluated by myself and Dr.  Erlinda Hongodd Vanden Hoek, who agrees with the above assessment and plan.      ADDENDUM BY Cherlynn PerchesJOHN ROWE, MA:     The patient was positive chlamydia.  Needs to be started on azithromycin 1 g   per [facility], PA-C.  I spoke to the patient about results.  She was given  standard STD instructions and told to followup with her PCP, OB or even the  health department for extensive STD testing.  She asked that I call the  prescription into [facility] on [address] in [place],   [phone].      ___________________  Erlinda Hongodd Vanden Hoek MD  Dictated By: Dayton ScrapeNichole V. Rice, PA-C    My signature above authenticates this document and my orders, the final  diagnosis (es), discharge prescription (s), and instructions in the [facility]  Pul[facility]heck record.  Nursing notes have been reviewed by the physician/mid-level provider.    If you have any questions please contact (  [phone].    MLT  D:08/03/2013 11:37:32  T: 08/03/2013 17:26:52  [id number]  Electronically Authenticated by:  Naftali Carchi L. Wilfrid LundVanden Hoek, M.D. On 08/08/2013 04:05 PM EDT

## 2013-09-11 NOTE — ED Provider Notes (Signed)
[facility]  EMERGENCY [facility]PARTMENT TREATMENT REPORT  NAME:  Linda [name], Linda [name]  SEX:   F  ADMIT: 09/11/2013  DOB:   01/31/1991  MR#    [id number]  ROOM:    TIME DICTATED: 05 57 PM  ACCT#  [id number]        DATE OF SERVICE:  09/11/13    TIME OF EVALUATION:  1602    PRIMARY CARE PHYSICIAN:  None.    CHIEF COMPLAINT:  Pregnant, stomach pain.    HISTORY OF PRESENT ILLNESS:  A 23 year old female with constant right lower suprapubic abdominal pain times  5 days, went to [facility] today for further evaluation knowing that she  was approximately [redacted] weeks pregnant by last menstrual period.  The patient is  a G3, P2.  No complications with prior pregnancies, although she reports that  she took Vicodin throughout her pregnancies for pain.  She describes her  abdominal pain as constant.  No exacerbating or relieving factors.  Never  going away, worse at night.  No associated vomiting or abnormal bowel   movements.    REVIEW OF SYSTEMS:  CONSTITUTIONAL:  No fevers or chills.  EYES:   No visual symptoms.  ENT:  No sore throat, runny nose, or other URI symptoms.   RESPIRATORY:  No cough, shortness of breath, or wheezing.   CARDIOVASCULAR:  No chest pain, chest pressure, or palpitations.  GASTROINTESTINAL:  As above.  No nausea or vomiting.  Bowel movements normal.  GENITOURINARY:  No vaginal bleeding or discharge.    PAST MEDICAL HISTORY:    G3 P2 A0.   She has a history of hypothyroidism, chronic kidney infection,  bipolar disorder, multiple personality disorder, ADHD.    SOCIAL HISTORY:  The patient stopped drinking and smoking now that she is pregnant; no drug   use.    FAMILY HISTORY:  Unknown.    ALLERGIES:  PERCOCET.    MEDICATIONS:  None.    PHYSICAL EXAMINATION:  VITAL SIGNS:  Blood pressure 133/98, pulse 89, respirations 18, temperature  98.8, pain 9 out of 10.  O2 sats 99% on room air.  GENERAL APPEARANCE:  The patient appears well developed and well nourished.   Appearance and behavior are age and situation appropriate.  The patient  appears fixated on receiving Vicodin.  EYES:  Conjunctivae clear, lids normal.  Pupils equal, symmetrical, and  normally reactive.  ENT:  Ears/Nose:  Hearing is grossly intact to voice.  Internal and external  examinations of the ears and nose are unremarkable. Mouth/Throat:  Surfaces of  the pharynx, palate, and tongue are pink, moist, and without lesions.  Nasal  mucosa, septum, and turbinates unremarkable.  Teeth and gums unremarkable.    RESPIRATORY:  Clear and equal breath sounds.  No respiratory distress,  tachypnea, or accessory muscle use.    CARDIOVASCULAR:   Heart regular, without murmurs, gallops, rubs, or thrills.   GASTROINTESTINAL:  Abdomen is soft. Tenderness to palpation in suprapubic  region and to a greater degree left lower quadrant, somewhat to the right  lower quadrant.  There is no tenderness on my examination whatsoever over  McBurney's point.  No rebound, no guarding.  With distraction, the patient  appears to have no pain.  No CVA or flank tenderness.  GENITOURINARY:  Performed with RN at bedside.  The external genitalia without  swelling, lesions or discharge.  Vaginal walls show no bulging or lesions.  Cervix pink, no lesions noted.  No erythema or cervical motion tenderness.  There is a significant amount of thick white discharge in the vaginal vault.  Uterus movable without mass or tenderness.  Adnexal palpation reproduces the  patient's mid to left and right-sided abdominal tenderness completely.  No  adnexal mass or tenderness otherwise noted.  MUSCULOSKELETAL:  Stance and gait appear normal.    INITIAL ASSESSMENT AND MANAGEMENT PLAN:  This is an extremely comfortable appearing 23 year old female with overall  benign abdominal examination, reproducible pain on pelvic exam.  We will  obtain ultrasound to rule out ectopic pregnancy.  I do not suspect torsion   given constant 5-day pain.  We will obtain urine to rule out infection.    DIAGNOSTIC STUDIES:  Wet prep showed clue cells.  Gonorrhea and chlamydia pending.  Ultrasound  showing a single live IUP, approximately 13 weeks per Radiology.    COURSE:  The patient was stable in the Emergency Department and did not develop further  symptoms.  Before had results, could not receive Tylenol as she had already  taken this within a few hours.  We are not comfortable giving the patient  Vicodin given the pregnancy status, psychologic history, and lack of any  appreciable significant pain on examination requiring narcotic analgesia.  The  patient requested a meal, threatened to walk out without receiving her  paperwork, but this was given to her and she was discharged after evaluation  by Dr. Hervey Ardsuchitani.    FINAL DIAGNOSES:  1.  Pregnancy.  2.  Acute pelvic pain.  3.  Urinary tract infection.    DISPOSITION AND TREATMENT PLAN:  Discharge instructions given with a prescription for Keflex.  Bacterial  vaginosis was not felt to require emergent treatment at this time.  She was  advised to follow up with her OB/GYN and return with new or worsening  symptoms.  The patient is discharged home in stable condition, with  instructions to follow up with their regular doctor.  They are advised to  return immediately for any worsening or symptoms of concern.  The patient was  personally evaluated by myself and Dr. Micki RileySara Tsuchitani, who agrees with the  above assessment and plan.      CONTINUATION BY BRYAN HENDRICK, PA-C    DIAGNOSTIC STUDIES:  [facility] diagnostic studies were reviewed.  CBC was unremarkable except  for hemoglobin 11.7, MCH 25.9, MCHC 32.4, monocytes 7.8, otherwise normal.  BMP unremarkable except for sodium 137, CO2 of 21, creatinine 0.4, BUN 6,  otherwise normal.  Urine unremarkable except for a white blood cell count of  2+, white blood cells 20 to 50 per high-powered field, 10 to 20 red blood   cells per high powered field, 1+ bacteria, occasional mucus.  Otherwise, the  [facility] laboratory results were unremarkable.       CONTINUATION BY Micki RileySARA TSUCHITANI, MD    I interviewed and examined the patient.  I discussed with the mid-level  provider and agree with their evaluation and plan as documented here.      The patient complains of a cramping type pain to her right it is really her  pelvic area on exam.  Actually, when I walk in to evaluate her, she is eating  a sandwich out of the meal box and she in no distress.  She complains of some  discomfort all across her low back, but it is bilateral, it seems reproducible  with palpation.  It does not seem consistent with renal colic.  She states her  pain has been ongoing for  days.  It is not related to food.  She says it seems  to act up worse at night and she just states that is not related to food.  She  has not had a fever, has not been vomiting.  She is eating now in the ER and  her workup thus far other than what looks like infected urine which we are  going to treat was unremarkable.  She has been pregnant twice before, saw a  doctor in [place] and wants to be referred to one in [place].  She  inquired about \\something for pain\\ and I told her Tylenol is really all  that is safe when she is pregnant.  She stated she wanted a prescription for  Vicodin because she took Vicodin with her other pregnancies and I explained  that I am not going to prescribe Vicodin while she is pregnant, she needs to  talk to an OB.  She was upset about this, asked me to call her OB in  [place].  I explained she is not currently under their care.  She has not  seen him since her last child, so they would not really be able to advise me.  She asked me to call the on-call OB today to ask if she could have Vicodin and  I explained that also not appropriate being as they have not seen her.  I told   her I do not think she has appendicitis.  Her exam is incredibly benign.  She  has no rebound, no guarding.  She complains of, in addition to this cramping  pain in the very right low pelvis, a pain that comes and goes sort of in her  left upper quadrant but I advised her that she had labs with a normal white  count at [facility], a benign exam, lack of fever, lack of any peritoneal  signs, I do not think that she has appendicitis, but I have asked her to be  rechecked in 24 to 48 hours.  Her family is in the room for this discussion.  SHE DENIES ANY ALLERGIES OTHER THAN THE PERCOCET.  We are  going to prescribe  her Keflex for  her UTI.    DISPOSITION:  Discharge home.    DIAGNOSIS:   Please see dictation by Anibal Henderson, PA-C.      ___________________  Judeen Hammans Tsuchitani MD  Dictated By: Mearl Latin. Lockie Pares, PA-C    My signature above authenticates this document and my orders, the final  diagnosis (es), discharge prescription (s), and instructions in the [facility]  Pul[facility]ck record.  Nursing notes have been reviewed by the physician/mid-level provider.    If you have any questions please contact [phone].    [facility]  D:09/11/2013 17:57:36  T: 09/11/2013 20:43:36  [id number]  Electronically Authenticated by:  Tana Conch, MD On 10/01/2013 08:01 PM EDT

## 2013-09-12 NOTE — ED Provider Notes (Signed)
[facility]  EMERGENCY DEPARTMENT TREATMENT REPORT  NAME:  Linda [name]  SEX:   F  ADMIT: 09/12/2013  DOB:   [birth year]  MR#    [id number]  ROOM:    TIME DICTATED: 09 03 AM  ACCT#  [id number]        CHIEF COMPLAINT:  Vaginal bleeding, 13 weeks' pregnant, via EMS.    HISTORY OF PRESENT ILLNESS:  A 23 year old female who presents to Emergency Department with sudden-onset  bleeding and abdominal pain this morning around 6:00 a.m.  States she was not  feeling well, had lower belly cramping and right-sided cramping belly pain  described as 8 out of 10 nonradiating.  States she got up to go to the  bathroom and had significant bleeding from the vagina and then the cramping  got worse.  The patient was very concerned and called 911.  The patient also  states she was in yesterday for lower pelvic pain, diagnosed with a urinary  tract infection, was not bleeding yesterday.  The patient is a G3, P2, A1 and  currently 13 weeks, diagnosed intrauterine pregnancy yesterday on ultrasound.    REVIEW OF SYSTEMS:  CONSTITUTIONAL:  No fever, chills, or weight loss.   EYES:   No visual symptoms.   ENT:  No sore throat, runny nose, or other URI symptoms.   RESPIRATORY:  No cough, shortness of breath, or wheezing.   CARDIOVASCULAR:  No chest pain, chest pressure, or palpitations.   GASTROINTESTINAL:  No vomiting, no diarrhea.  Positive pain.  GENITOURINARY:  No frequency, dysuria or urgency.  MUSCULOSKELETAL:  No joint pain or swelling.   INTEGUMENTARY:  No rashes.   NEUROLOGICAL:  No headaches, sensory or motor symptoms.   PSYCHIATRIC:  No suicidal or homicidal ideation.     PAST MEDICAL HISTORY:  Positive for hypothyroidism and chronic kidney infections.    PAST SURGICAL HISTORY:  Negative.    PSYCHIATRIC HISTORY:  Includes mania or bipolar, multiple personality, and ADHD.    SOCIAL HISTORY:  Denies alcohol, tobacco or drug abuse.  Did drink socially but found out she   was pregnant [redacted] weeks ago and has stopped.  Lives at home alone with family.    KNOWN ALLERGIES:  PERCOCET - GETS  DIZZY AND LIGHTHEADED.    MEDICATIONS:  Synthroid, unknown dose, stopped taking it a month ago.    PHYSICAL EXAMINATION:  VITAL SIGNS:  Blood pressure is 124/70, pulse 88, respirations 18, temperature  98.4, saturating at 96% on room air, pain 10 out of 10.  GENERAL APPEARANCE:  Patient appears well developed and well nourished.  Appearance and behavior are age and situation appropriate.  The patient  sitting comfortably semi-Fowler's on exam bed.  RESPIRATORY:  Clear and equal breath sounds.  No respiratory distress,  tachypnea, or accessory muscle use.     CARDIOVASCULAR:   Heart regular, without murmurs, gallops, rubs, or thrills.  DP pulses 2+ and equal bilaterally, radial.    CHEST:  Chest symmetrical without masses or tenderness.   GASTROINTESTINAL:  Abdomen is soft, nondistended.  Bowel sounds are present.  The patient is tender in both lower quadrants.  No masses appreciated.  GENITOURINARY:  During exam, moving the patient into the appropriate position  for a GYN exam, the patient had significant blood between her legs, also noted  delivered fetus that was contained and placed into a urine specimen cup.  Examination of the external genitalia are without lesions or swelling.  There  is bloody  discharge.  Vaginal walls have no bulging or lesions.  The cervix is  pink and the os is slightly dilated.  The patient has cervical motion  tenderness and adnexal tenderness bilaterally.  The patient passed significant  clot through the speculum upon insertion.  SKIN:  Warm and dry without rashes.   NEUROLOGIC:  Alert, oriented.  Sensation intact, motor strength equal and  symmetric. There is no facial asymmetry or dysarthria.     PSYCHIATRIC:   Judgment appears appropriate.   Recent and remote memory appear  to be intact.  Mood is depressed.  Affect is flattened.      INITIAL ASSESSMENT AND MANAGEMENT PLAN:  The patient is a 23 year old female who presents to Emergency Department via  EMS with significant blood loss from the vagina, cramping, and miscarried a  child on the ER exam bed.  Contents will be sent to lab for pathology and  evaluation.  The patient was given an IV liter of saline for hydration and lab  work for a PT, CBC, and a BMP.  The patient will also get a blood type because  she is not aware of what her blood type is.   For medications, the patient  will be given ketorolac 30 mg IV to assist with hemorrhage control, Zofran 4  mg IV push for nausea, Dilaudid 0.5 IV push for pain with a repeat order  placed, and azithromycin 1 gram; the patient was in yesterday with UTI  symptoms.  Lab results came back with chlamydia, negative for gonorrhea.  The  patient will be held in the ER for a short time to make sure that the bleeding  has stopped.    CONTINUATION BY Dixie DialsBEN Emiley Digiacomo, MD:     INITIAL ASSESSMENT:  A patient who is having a spontaneous miscarriage.  Products of conception  have been removed and sent for pathology.  This is a new problem for this  patient.     RESULTS:   Beta is a 31,793.  BMP is normal. INR normal.  Blood type is O positive.  CBC:  Hemoglobin and hematocrit 12 and 34, otherwise normal.      MEDICAL DECISION MAKING AND HOSPITAL COURSE:   The patient continued to have significant bleeding.  She also had pain.  She  was given IV Zofran and Dilaudid, Toradol and fluids.  She was given oral  azithromycin for chlamydia.  She threw this up, so therefore, we have repeated  that.  Due to continued bleeding Audry PiliMike Owens repeated pelvic exam and found  residual tissue in the os.  I have reexamined her now and her bleeding has  stopped.  I do not see any tissue within the cervical os.  I believe at this  time she can be safely discharged home.  I have encouraged her to follow up  with OB/GYN and given her Dr. Josephine CablesKemp's name.  She is to return for increasing   pain, bleeding or fever.    DISPOSITION:  Home in stable condition.    DIAGNOSIS:  Acute spontaneous miscarriage.      ___________________  Christiana PellantBen A Vickki Igou MD  Dictated By: Dinah BeersMichael OLeary, PA-C    My signature above authenticates this document and my orders, the final  diagnosis (es), discharge prescription (s), and instructions in the [facility]  Pulsecheck record.  Nursing notes have been reviewed by the physician/mid-level provider.    If you have any questions please contact [phone].    DE  D:09/12/2013 09:03:34  T: 09/12/2013 12:03:02  [id number]  Electronically Authenticated by:  Carlis StableBen A. Carmela HurtFickenscher, M.D. On 09/13/2013 06:33 AM EDT
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
## 2013-09-16 NOTE — Op Note (Signed)
[facility]  Operation Report  NAME:  [name], Linda  SEX:   F  DATE: 09/16/2013  DOB: December 28, 1990  MR#    [id number]  ROOM:  [id number]  ACCT#  [id number]        PREOPERATIVE DIAGNOSIS:  Incomplete abortion/retained products of conception.    POSTOPERATIVE DIAGNOSIS:  Incomplete abortion/retained products of conception.    PROCEDURES PERFORMED:  Suction dilatation and curettage with removal of products of conception.    SURGEON:  Milinda AntisPeter J. Arayla Kruschke, M.D.    ESTIMATED BLOOD LOSS:  200 mL of clots removed from vagina.      ANESTHESIA:  General.    COMPLICATIONS:  None.    DESCRIPTION OF PROCEDURE:  The patient was taken to the operating room and placed on the operating table  in the supine position with general anesthesia initiated without complication.  The patient placed in lithotomy, prepped and draped in the usual sterile  fashion.  Red catheter was used to drain the bladder.  Evaluation under  anesthesia denotes large amount of clot in the vagina removed with ring  forceps and suctioned.  At this point, we were able to visualize the cervix.  Bimanual exam then disclosed that the cervical os was open about 2 cm with  products of conception at the os opening and uterus was anteverted, about 12  to 14 weeks in size.  Following this, the products at the os were grasped with  a ring forceps and gently tractioned down and in a hand-over-hand fashion with  another ring forceps, the products of conception were gently teased out of the  interior uterine cavity and a large amount of products of conception removed  intact.  These were sent to pathology for assessment.  After this was done,  the suction curet #12 curved which could be easily introduced into the uterine  cavity with care being taken not to cause perforation, was passed inside with  suction applied and additional products of conception being removed.  After  several passes with care not to cause perforation, suction was stopped.  After   this, a gentle sharp curettage was performed with only minimal amount of  products being removed.  Following this, an additional suction curet was  performed with no further products being removed.  Care was taken at all times  during the procedure not to cause perforation with either the sharp curettage  or the suction curettage.  Once this had been complete, the vagina was cleaned  of any residual clots.  The tenaculum was removed.  The silver nitrate was  applied to the second puncture site.  Reexamination of the patient now noted  that the uterus was smaller, anteverted, approximately 10 to 12 weeks in size.  After this, the weighted speculum was removed.  The patient was hemostatically  intact and taken to the recovery room in stable condition.    Specimens sent to pathology for assessment.      ___________________  Milinda AntisPeter J Dereona Kolodny MD  Dictated By:.   MLT  D:09/16/2013 11:42:32  T: 09/16/2013 12:43:04  [id number]  Electronically Authenticated by:  Milinda AntisPeter J. Wynn Kernes, M.D. On 09/27/2013 12:42 PM EDT

## 2013-09-16 NOTE — Op Note (Signed)
[facility]  Operation Report  NAME:  Linda [name], Linda [name]  SEX:   F  DATE: 09/16/2013  DOB: 08-18-1990  MR#    [id number]  ROOM:  [id number]  ACCT#  [id number]        PREOPERATIVE DIAGNOSIS:  Incomplete abortion/retained products of conception.    POSTOPERATIVE DIAGNOSIS:  Incomplete abortion/retained products of conception.    PROCEDURES PERFORMED:  Suction dilatation and curettage with removal of products of conception.    SURGEON:  Milinda AntisPeter J. Omair Dettmer, M.D.    ESTIMATED BLOOD LOSS:  200 mL of clots removed from vagina.      ANESTHESIA:  General.    COMPLICATIONS:  None.    DESCRIPTION OF PROCEDURE:  The patient was taken to the operating room and placed on the operating table  in the supine position with general anesthesia initiated without complication.  The patient placed in lithotomy, prepped and draped in the usual sterile  fashion.  Red catheter was used to drain the bladder.  Evaluation under  anesthesia denotes large amount of clot in the vagina removed with ring  forceps and suctioned.  At this point, we were able to visualize the cervix.  Bimanual exam then disclosed that the cervical os was open about 2 cm with  products of conception at the os opening and uterus was anteverted, about 12  to 14 weeks in size.  Following this, the products at the os were grasped with  a ring forceps and gently tractioned down and in a hand-over-hand fashion with  another ring forceps, the products of conception were gently teased out of the  interior uterine cavity and a large amount of products of conception removed  intact.  These were sent to pathology for assessment.  After this was done,  the suction curet #12 curved which could be easily introduced into the uterine  cavity with care being taken not to cause perforation, was passed inside with  suction applied and additional products of conception being removed.  After  several passes with care not to cause perforation, suction was stopped.  After  this, a gentle sharp  curettage was performed with only minimal amount of  products being removed.  Following this, an additional suction curet was  performed with no further products being removed.  Care was taken at all times  during the procedure not to cause perforation with either the sharp curettage  or the suction curettage.  Once this had been complete, the vagina was cleaned  of any residual clots.  The tenaculum was removed.  The silver nitrate was  applied to the second puncture site.  Reexamination of the patient now noted  that the uterus was smaller, anteverted, approximately 10 to 12 weeks in size.  After this, the weighted speculum was removed.  The patient was hemostatically  intact and taken to the recovery room in stable condition.    Specimens sent to pathology for assessment.      ___________________  Milinda AntisPeter J Kariah Loredo MD  Dictated By:.   MLT  D:09/16/2013 11:42:32  T: 09/16/2013 12:43:04  [id number]  Electronically Authenticated by:  Milinda AntisPeter J. Anahid Eskelson, M.D. On 09/27/2013 12:42 PM EDT

## 2013-12-28 NOTE — Progress Notes (Signed)
Patient was not seen by me .

## 2014-02-14 ENCOUNTER — Inpatient Hospital Stay
Admit: 2014-02-14 | Discharge: 2014-02-14 | Disposition: A | Discharge status: Home / Self Care | Payer: MEDICAID | Attending: Emergency Medicine

## 2014-02-14 DIAGNOSIS — O98319 Other infections with a predominantly sexual mode of transmission complicating pregnancy, unspecified trimester: Secondary | ICD-10-CM

## 2014-02-14 LAB — URINALYSIS W/ RFLX MICROSCOPIC
Bilirubin: NEGATIVE
Blood: NEGATIVE
Glucose: NEGATIVE mg/dL
Ketone: NEGATIVE mg/dL
Leukocyte Esterase: NEGATIVE
Nitrites: NEGATIVE
Protein: NEGATIVE mg/dL
Specific gravity: 1.02 (ref 1.003–1.030)
Urobilinogen: 0.2 EU/dL (ref 0.2–1.0)
pH (UA): 7 (ref 5.0–8.0)

## 2014-02-14 LAB — HCG URINE, QL: HCG urine, QL: POSITIVE — AB

## 2014-02-14 LAB — WET PREP
Wet prep: NONE SEEN
Wet prep: NONE SEEN

## 2014-02-14 MED ORDER — PRENATAL VITAMIN,CALCIUM,MINERALS-IRON-FOLIC ACID TABLET
ORAL_TABLET | Freq: Every day | ORAL | Status: DC
Start: 2014-02-14 — End: 2014-07-05

## 2014-02-14 NOTE — ED Provider Notes (Signed)
HPI Comments: Linda [name] is a 23 y.o. G3P2 female with a history of hypothyroidism, morbid obesity who presents to the emergency department for evaluation of possible pregnancy and exposure to STD.  She states her bf called her and let her know he had trichomonas.  Her last sexual encounter with him was the beginning of this month.  She states her LMP was 01/04/14.  She is not trying to get pregnant, but is not taking birth control. Pt denies any vaginal bleeding, recent fever or chills, headache, dizziness or light headedness, ENT issues, CP or discomfort, SOB, cough, n/v/d/c, abd pain, back pain, melena/hematochezia, dysuria, hematuria, focal weakness/numbness/tingling, or rash.  Patient has no other complaints at this time.          Patient is a 23 y.o. female presenting with STD exposure.   Exposure to STD  Pertinent negatives include no chest pain, no abdominal pain, no headaches and no shortness of breath.        Past Medical History:   Diagnosis Date   ??? Hypothyroidism    ??? Endocrine disease      hypothyroidism   ??? Acquired hypothyroidism      hypothroidism   ??? Morbid obesity (HCC)        No past surgical history on file.      History reviewed. No pertinent family history.    History     Social History   ??? Marital Status: SINGLE     Spouse Name: N/A     Number of Children: N/A   ??? Years of Education: N/A     Occupational History   ??? Not on file.     Social History Main Topics   ??? Smoking status: Never Smoker    ??? Smokeless tobacco: Not on file   ??? Alcohol Use: No   ??? Drug Use: No   ??? Sexual Activity:     Partners: Male     Pharmacist, hospitalBirth Control/ Protection: None     Other Topics Concern   ??? Not on file     Social History Narrative                ALLERGIES: Percocet      Review of Systems   Constitutional: Negative for fever and chills.   HENT: Negative for congestion, rhinorrhea and sore throat.    Respiratory: Negative for cough and shortness of breath.    Cardiovascular: Negative for chest pain.    Gastrointestinal: Negative for nausea, vomiting, abdominal pain, diarrhea and constipation.   Genitourinary: Positive for vaginal discharge. Negative for dysuria, hematuria and vaginal bleeding.   Musculoskeletal: Negative for myalgias and back pain.   Skin: Negative for rash and wound.   Neurological: Negative for dizziness, weakness, numbness and headaches.   Psychiatric/Behavioral: Negative for behavioral problems and agitation.       Filed Vitals:    02/14/14 0305 02/14/14 0328   BP: 136/68    Pulse: 72    Temp: 98.5 ??F (36.9 ??C)    Resp: 14    Height: 5\' 4"  (1.626 m)    Weight: 113.399 kg (250 lb)    SpO2: 99% 99%            Physical Exam   Constitutional: She is oriented to person, place, and time. She appears well-developed and well-nourished. No distress.   HENT:   Head: Normocephalic and atraumatic.   Nose: Nose normal.   Mouth/Throat: Oropharynx is clear and moist. No oropharyngeal exudate.  Eyes: Conjunctivae are normal. Pupils are equal, round, and reactive to light. Right eye exhibits no discharge. Left eye exhibits no discharge.   Neck: Normal range of motion. No thyromegaly present.   Cardiovascular: Normal rate, normal heart sounds and intact distal pulses.  Exam reveals no gallop and no friction rub.    No murmur heard.  Pulmonary/Chest: Effort normal and breath sounds normal. No respiratory distress. She has no wheezes. She has no rales. She exhibits no tenderness.   Abdominal: Soft. Bowel sounds are normal. She exhibits no distension. There is no tenderness.   Genitourinary:   Pelvic Exam:    Speculum and bimanual pelvic exam performed at bedside with chaperone assistance of Scarle[place], RN. Normal exam of external labia majora and minora without apparent rash or lesions.  Normal exam of vagina, cervix, uterus size and bilateral ovaries. Vaginal discharge was thin, white, frothy, malodorous.  There was no vaginal bleeding.  Cervical os was  closed.  No adnexal masses or tenderness appreciated.  Patient tolerated procedure well.  No CMT.  Samples obtained for GC/Chlamydia and wet prep, labeled at bedside and sent to lab.  3:49 AM     Musculoskeletal: Normal range of motion.   Lymphadenopathy:     She has no cervical adenopathy.   Neurological: She is alert and oriented to person, place, and time.   Skin: Skin is warm and dry. No rash noted. She is not diaphoretic.   Psychiatric: She has a normal mood and affect. Her behavior is normal.   Nursing note and vitals reviewed.       MDM  Number of Diagnoses or Management Options  BV (bacterial vaginosis): new and requires workup  Pregnancy test performed, pregnancy confirmed: new and requires workup  Diagnosis management comments: Differential Diagnosis:  Candidiasis, trichomoniasis, bacterial vaginitis, GC/Chlamydia, pregnancy, urethritis/UTI, vaginal foreign body, atrophic vaginitis, contact vulvovaginitis, physiologic discharge    Plan:  Pt presents in NAd, vitals wnl.  Exam consistent with BV.  UA negative.  Micro shows mild BV.  Will defer treatment of BV to OB.  Pt not complaining of vaginal bleeding/spotting or any abdominal pain.  No indication to obtain US tonight.  Will DC home with PNV and OB follow-up. Patient demonstrates understanding of current diagnoses and is in agreement with the treatment plan.  They are advised that while the likelihood of serious underlying condition is low at this point given the evaluation performed today, we cannot fully rule it out.  They are advised to immediately return with any new symptoms or worsening of current condition.  All questions have been answered.  Patient is given educational material regarding their diagnoses, including danger symptoms and when to return to the ED.                 Amount and/or Complexity of Data Reviewed  Clinical lab tests: ordered and reviewed  Decide to obtain previous medical records or to obtain history from  someone other than the patient: yes  Review and summarize past medical records: yes    Risk of Complications, Morbidity, and/or Mortality  Presenting problems: moderate  Diagnostic procedures: moderate  Management options: moderate    Patient Progress  Patient progress: improved      Procedures                                                      -------------------------------------------------------------------------------------------------------------------  Orders:  Orders Placed This Encounter   ??? PELVIC EXAM     Standing Status: Standing      Number of Occurrences: 1      Standing Expiration Date:    ??? WET PREP     Standing Status: Standing      Number of Occurrences: 1      Standing Expiration Date:    ??? CHLAMYDIA/GC AMPLIFIED     Standing Status: Standing      Number of Occurrences: 1      Standing Expiration Date:      Order Specific Question:  Sample type     Answer:  Swab [241]     Order Specific Question:  Specimen source     Answer:  Endocervix [350]   ??? HCG URINE, QL     Standing Status: Standing      Number of Occurrences: 1      Standing Expiration Date:    ??? URINALYSIS W/ RFLX MICROSCOPIC     Standing Status: Standing      Number of Occurrences: 1      Standing Expiration Date:    ??? prenatal multivit-ca-min-fe-fa (PRENATAL VITAMIN) tab     Sig: Take 1 Tab by mouth daily.     Dispense:  30 Tab     Refill:  0        Lab Results:   Recent Results (from the past 12 hour(s))   HCG URINE, QL    Collection Time: 02/14/14  3:10 AM   Result Value Ref Range    HCG urine, Ql. POSITIVE (A) NEG     URINALYSIS W/ RFLX MICROSCOPIC    Collection Time: 02/14/14  3:36 AM   Result Value Ref Range    Color YELLOW      Appearance HAZY      Specific gravity 1.020 1.003 - 1.030      pH (UA) 7.0 5.0 - 8.0      Protein NEGATIVE  NEG mg/dL    Glucose NEGATIVE  NEG mg/dL    Ketone NEGATIVE  NEG mg/dL    Bilirubin NEGATIVE  NEG      Blood NEGATIVE  NEG      Urobilinogen 0.2 0.2 - 1.0 EU/dL    Nitrites NEGATIVE  NEG       Leukocyte Esterase NEGATIVE  NEG     WET PREP    Collection Time: 02/14/14  3:42 AM   Result Value Ref Range    Special Requests: NO SPECIAL REQUESTS      Wet prep FEW  CLUE CELLS PRESENT        Wet prep NO YEAST SEEN      Wet prep NO TRICHOMONAS SEEN         Progress Notes:  3:54 AM:  Michiel SitesAlison H Elza Varricchio, PA-C was at the pt's bedside, assessed the pt and answered the pt's questions regarding treatment.    -------------------------------------------------------------------------------------------------------------------    Disposition:  Diagnosis:   1. Pregnancy test performed, pregnancy confirmed    2. BV (bacterial vaginosis)        Disposition: DC Home    Follow-up Information     Follow up With Details Comments Contact Info    Thurston Holeavid R Peters, MD Call in 1 day To establish prenatal care and for treatment of your bacterial vaginosis [address]  [place] [id number]  [phone]      [facility]  Please return immediately to the Emergency Room for re-evaluation  if you develop any new symptoms or worsening of current symptoms! [address]  [place] [address]  [phone]          Current Discharge Medication List      START taking these medications    Details   prenatal multivit-ca-min-fe-fa (PRENATAL VITAMIN) tab Take 1 Tab by mouth daily.  Qty: 30 Tab, Refills: 0

## 2014-02-14 NOTE — ED Notes (Signed)
"  I think I may be pregnant and I also think that I may be exposed to as STD."

## 2014-02-14 NOTE — ED Notes (Signed)
I have reviewed discharge instructions with the patient.  The patient verbalized understanding.  Patient armband removed and given to patient to take home.  Patient was informed of the privacy risks if armband lost or stolen.

## 2014-02-16 LAB — CHLAMYDIA/GC PCR
Chlamydia amplified: POSITIVE — AB
N. gonorrhea, amplified: NEGATIVE

## 2014-02-18 NOTE — Other (Cosign Needed Addendum)
CALLED PATIENT BACK AT LISTED HOME NUMBERS, [phone] AND [phone] TO INFORM OF + CHLAMYDIA AND NEED FOR TREATMENT.  NO ANSWER AT EITHER NUMBER, MESSAGES LEFT FOR PATIENT TO CALL ER BACK.  LETTER WAS SENT.

## 2014-02-18 NOTE — Other (Cosign Needed)
PATIENT CALLED BACK, INFORMED OF + CHLAMYDIA AND NEED FOR TREATMENT.  RX FOR ZITHROMAX CALLED INTO [facility] ON [address].

## 2014-02-18 NOTE — Progress Notes (Signed)
Quick Note:        CALLED PATIENT BACK AT LISTED HOME NUMBERS, [phone] AND [phone] TO INFORM OF + CHLAMYDIA AND NEED FOR TREATMENT. NO ANSWER AT EITHER NUMBER, MESSAGES LEFT FOR PATIENT TO CALL ER BACK. LETTER WAS SENT.    ______

## 2014-03-08 NOTE — ED Notes (Signed)
Per pt, "I'm having real sharp pains in the middle of my head and it's been happening for a week now and it feels like some blood is coming out of my head."

## 2014-03-08 NOTE — ED Notes (Signed)
Pt stated that she no longer wants to been seen and turned her wrist band in to security. Pt was AxOx4 and ambulatory with a stable gait. NAD noted

## 2014-03-09 ENCOUNTER — Inpatient Hospital Stay: Admit: 2014-03-09 | Discharge: 2014-03-09 | Payer: MEDICAID | Attending: Emergency Medicine

## 2014-03-09 ENCOUNTER — Inpatient Hospital Stay
Admit: 2014-03-09 | Discharge: 2014-03-09 | Discharge status: Against Medical Advice | Payer: MEDICAID | Attending: Emergency Medicine

## 2014-03-09 DIAGNOSIS — R51 Headache: Secondary | ICD-10-CM

## 2014-03-09 LAB — URINE MICROSCOPIC ONLY
RBC: NEGATIVE /hpf (ref 0–5)
WBC: 11 /hpf (ref 0–4)

## 2014-03-09 LAB — METABOLIC PANEL, BASIC
Anion gap: 7 mmol/L (ref 3.0–18)
BUN/Creatinine ratio: 10 — ABNORMAL LOW (ref 12–20)
BUN: 5 MG/DL — ABNORMAL LOW (ref 7.0–18)
CO2: 25 mmol/L (ref 21–32)
Calcium: 8.5 MG/DL (ref 8.5–10.1)
Chloride: 106 mmol/L (ref 100–108)
Creatinine: 0.5 MG/DL — ABNORMAL LOW (ref 0.6–1.3)
GFR est AA: >60 mL/min/{1.73_m2} (ref >60)
GFR est non-AA: >60 mL/min/{1.73_m2} (ref >60)
Glucose: 88 mg/dL (ref 74–99)
Potassium: 3.4 mmol/L — ABNORMAL LOW (ref 3.5–5.5)
Sodium: 138 mmol/L (ref 136–145)

## 2014-03-09 LAB — URINALYSIS W/ RFLX MICROSCOPIC
Bilirubin: NEGATIVE
Blood: NEGATIVE
Glucose: NEGATIVE mg/dL
Ketone: NEGATIVE mg/dL
Nitrites: NEGATIVE
Protein: NEGATIVE mg/dL
Specific gravity: 1.02 (ref 1.003–1.030)
Urobilinogen: 0.2 EU/dL (ref 0.2–1.0)
pH (UA): 6 (ref 5.0–8.0)

## 2014-03-09 LAB — CBC WITH AUTOMATED DIFF
ABS. BASOPHILS: 0 10*3/uL (ref 0.0–0.1)
ABS. EOSINOPHILS: 0 10*3/uL (ref 0.0–0.4)
ABS. LYMPHOCYTES: 1.9 10*3/uL (ref 0.9–3.6)
ABS. MONOCYTES: 0.4 10*3/uL (ref 0.05–1.2)
ABS. NEUTROPHILS: 2.3 10*3/uL (ref 1.8–8.0)
BASOPHILS: 0 % (ref 0–2)
EOSINOPHILS: 1 % (ref 0–5)
HCT: 29.9 % — ABNORMAL LOW (ref 35.0–45.0)
HGB: 9.8 g/dL — ABNORMAL LOW (ref 12.0–16.0)
LYMPHOCYTES: 41 % (ref 21–52)
MCH: 20 PG — ABNORMAL LOW (ref 24.0–34.0)
MCHC: 32.8 g/dL (ref 31.0–37.0)
MCV: 61.1 FL — ABNORMAL LOW (ref 74.0–97.0)
MONOCYTES: 8 % (ref 3–10)
MPV: 8.5 FL — ABNORMAL LOW (ref 9.2–11.8)
NEUTROPHILS: 50 % (ref 40–73)
PLATELET: 279 10*3/uL (ref 135–420)
RBC: 4.89 M/uL (ref 4.20–5.30)
RDW: 19.4 % — ABNORMAL HIGH (ref 11.6–14.5)
WBC: 4.7 10*3/uL (ref 4.6–13.2)

## 2014-03-09 LAB — MAGNESIUM: Magnesium: 1.5 mg/dL — ABNORMAL LOW (ref 1.8–2.4)

## 2014-03-09 LAB — HCG URINE, QL: HCG urine, QL: POSITIVE — AB

## 2014-03-09 LAB — BETA HCG, QT
Beta HCG, QT: 84093 m[IU]/mL — ABNORMAL HIGH (ref 0–10)
hCG Quant: 84093 m[IU]/mL — ABNORMAL HIGH (ref 0–10)

## 2014-03-09 MED ORDER — NITROFURANTOIN (25% MACROCRYSTAL FORM) 100 MG CAP
100 mg | ORAL_CAPSULE | Freq: Two times a day (BID) | ORAL | Status: AC
Start: 2014-03-09 — End: 2014-03-16

## 2014-03-09 MED ORDER — SODIUM CHLORIDE 0.9% BOLUS IV
0.9 % | Freq: Once | INTRAVENOUS | Status: AC
Start: 2014-03-09 — End: 2014-03-09
  Administered 2014-03-09: 10:00:00 via INTRAVENOUS

## 2014-03-09 MED ORDER — MAGNESIUM SULFATE 2 GRAM/50 ML IVPB
2 gram/50 mL (4 %) | Freq: Once | INTRAVENOUS | Status: DC
Start: 2014-03-09 — End: 2014-03-09

## 2014-03-09 MED FILL — SODIUM CHLORIDE 0.9 % IV: INTRAVENOUS | Qty: 1000

## 2014-03-09 NOTE — ED Notes (Addendum)
Pt c/o having sharp pains in her head x 1 week. Pt registered last night for same complaint and LWBS after triage due to wait time.

## 2014-03-09 NOTE — ED Provider Notes (Addendum)
HPI Comments: Linda [name] is a  23 y.o. Morbidly obese female h/o Hypothyroidism presents to the ED via POV c/o intermittent sharp right sided headache that last seconds associated with seeing yellow spots x 1 week. Additionally, she reports intermittent sharp left sided CP. She reports it hurts if she pushes on her chest. Denies fever, chills, dizziness/loc, neck pain/stiffness, palps, sob, wheeze, cough, abd pain, v/d, extremity weakness/numbness/tingling. She is convinced that the pain she feels in the head is neither a HA nor scalp pain related to her hair pulled back or glued.     LNMP: 01/16/14    Patient is a 23 y.o. female presenting with headaches.   Headache  Associated symptoms include headaches. Pertinent negatives include no chest pain, no abdominal pain and no shortness of breath.        Past Medical History:   Diagnosis Date   ??? Morbid obesity (HCC)    ??? Hypothyroidism        History reviewed. No pertinent past surgical history.      History reviewed. No pertinent family history.    History     Social History   ??? Marital Status: SINGLE     Spouse Name: N/A     Number of Children: N/A   ??? Years of Education: N/A     Occupational History   ??? Not on file.     Social History Main Topics   ??? Smoking status: Never Smoker    ??? Smokeless tobacco: Not on file   ??? Alcohol Use: No   ??? Drug Use: No   ??? Sexual Activity:     Partners: Male     Pharmacist, hospital Protection: None     Other Topics Concern   ??? Not on file     Social History Narrative                ALLERGIES: Percocet      Review of Systems   Constitutional: Negative for fever and chills.   HENT: Negative for ear pain and sore throat.    Eyes: Negative for pain and visual disturbance.   Respiratory: Negative for cough and shortness of breath.    Cardiovascular: Negative for chest pain and palpitations.   Gastrointestinal: Negative for nausea, vomiting, abdominal pain and diarrhea.   Genitourinary: Negative for dysuria, urgency and frequency.    Musculoskeletal: Negative for joint swelling, arthralgias, neck pain and neck stiffness.   Skin: Negative for color change, pallor, rash and wound.   Neurological: Positive for headaches. Negative for dizziness, syncope, facial asymmetry, speech difficulty, weakness and numbness.   Psychiatric/Behavioral: Negative for behavioral problems. The patient is not nervous/anxious.        Filed Vitals:    03/09/14 0330   BP: 127/67   Pulse: 74   Temp: 98.4 ??F (36.9 ??C)   Resp: 18   Weight: 124.013 kg (273 lb 6.4 oz)   SpO2: 100%            Physical Exam   Constitutional: She is oriented to person, place, and time. She appears well-developed and well-nourished. No distress.   HENT:   Head: Normocephalic and atraumatic.   Right Ear: Hearing, tympanic membrane, external ear and ear canal normal.   Left Ear: Hearing, tympanic membrane, external ear and ear canal normal.   Nose: Nose normal. No mucosal edema or rhinorrhea.   Mouth/Throat: Uvula is midline, oropharynx is clear and moist and mucous membranes are normal. No uvula swelling.  No oropharyngeal exudate, posterior oropharyngeal edema, posterior oropharyngeal erythema or tonsillar abscesses.   Eyes: Conjunctivae and EOM are normal. Pupils are equal, round, and reactive to light. Right eye exhibits no discharge. Left eye exhibits no discharge.   Neck: Trachea normal, normal range of motion, full passive range of motion without pain and phonation normal. Neck supple. No tracheal tenderness, no spinous process tenderness and no muscular tenderness present. No rigidity. No tracheal deviation, no edema, no erythema and normal range of motion present. No thyroid mass and no thyromegaly present.   Cardiovascular: Normal rate, regular rhythm and normal heart sounds.  Exam reveals no gallop and no friction rub.    No murmur heard.  Pulmonary/Chest: Effort normal and breath sounds normal. No accessory muscle usage or stridor. No tachypnea. No respiratory distress. She has no  decreased breath sounds. She has no wheezes. She has no rhonchi. She has no rales. She exhibits tenderness and bony tenderness.   Symmetric rise/fall of the chest wall. Speaks in full sentences. Great inspiratory effort.     Abdominal: Soft. Bowel sounds are normal. There is no tenderness. There is no rigidity, no rebound, no CVA tenderness, no tenderness at McBurney's point and negative Murphy's sign.   Lymphadenopathy:     She has no cervical adenopathy.   Neurological: She is alert and oriented to person, place, and time. She has normal strength and normal reflexes. She is not disoriented. No cranial nerve deficit or sensory deficit.   No focal neuro deficits. MS 5/5 throughout. NVI   Skin: Skin is warm and dry. She is not diaphoretic.   Psychiatric: She has a normal mood and affect. Her behavior is normal.   Nursing note and vitals reviewed.       MDM  Number of Diagnoses or Management Options  Chest wall pain: new and requires workup  Headache, unspecified headache type: new and requires workup  Patient left before treatment completed: new and requires workup  Urinary tract infection without hematuria, site unspecified: new and requires workup  Diagnosis management comments: DDX: Migraine, Tension, Cluster, ICH, Cervical sprain, Cervical strain, Skull Fracture, CVA, TIA, Concussion with or without LOC, CHI, Skull Contusion, Neoplasm    Consulted with Dr. Charlett Nose concerning patient Linda [name], standard discussion of reason for visit, HPI, ROS, PE, and current results available. He has interviewed & examined the patient. The pt is upset because she feels we are not doing anything to help her and has ripped her IV out.     [name], PA  March 09, 2014  5:56 AM     I had full intentions of repleting patient's magnesium. In addition, treating UTI in pregnancy. I do not feel any imaging is warranted at this this time and neither does Dr. Charlett Nose.     Pt left prior to treatment complete.         Amount and/or Complexity of Data Reviewed  Clinical lab tests: reviewed and ordered  Tests in the medicine section of CPT??: ordered and reviewed  Decide to obtain previous medical records or to obtain history from someone other than the patient: yes  Review and summarize past medical records: yes  Independent visualization of images, tracings, or specimens: yes    Risk of Complications, Morbidity, and/or Mortality  Presenting problems: moderate  Diagnostic procedures: moderate  Management options: moderate    Patient Progress  Patient progress: stable      Procedures    Diagnosis:   1. Headache, unspecified  headache type    2. Chest wall pain    3. Urinary tract infection without hematuria, site unspecified    4. Patient left before treatment completed          Disposition: PATIENT LEFT BEFORE TREATMENT COMPLETE.     Follow-up Information     Follow up With Details Comments Contact Info    [facility]  As needed, If symptoms worsen [address]  [place] [id number]  [phone]          Patient's Medications   Start Taking    No medications on file   Continue Taking    PRENATAL MULTIVIT-CA-MIN-FE-FA (PRENATAL VITAMIN) TAB    Take 1 Tab by mouth daily.   These Medications have changed    No medications on file   Stop Taking    No medications on file       Recent Results (from the past 24 hour(s))   URINALYSIS W/ RFLX MICROSCOPIC    Collection Time: 03/09/14  4:30 AM   Result Value Ref Range    Color YELLOW      Appearance CLOUDY      Specific gravity 1.020 1.003 - 1.030      pH (UA) 6.0 5.0 - 8.0      Protein NEGATIVE  NEG mg/dL    Glucose NEGATIVE  NEG mg/dL    Ketone NEGATIVE  NEG mg/dL    Bilirubin NEGATIVE  NEG      Blood NEGATIVE  NEG      Urobilinogen 0.2 0.2 - 1.0 EU/dL    Nitrites NEGATIVE  NEG      Leukocyte Esterase TRACE (A) NEG     HCG URINE, QL    Collection Time: 03/09/14  4:30 AM   Result Value Ref Range    HCG urine, Ql. POSITIVE (A) NEG     URINE MICROSCOPIC ONLY     Collection Time: 03/09/14  4:30 AM   Result Value Ref Range    WBC 11 to 20 0 - 4 /hpf    RBC NEGATIVE  0 - 5 /hpf    Epithelial cells 3+ 0 - 5 /lpf    Bacteria 3+ (A) NEG /hpf    Mucus 2+ (A) NEG /lpf   CBC WITH AUTOMATED DIFF    Collection Time: 03/09/14  5:10 AM   Result Value Ref Range    WBC 4.7 4.6 - 13.2 K/uL    RBC 4.89 4.20 - 5.30 M/uL    HGB 9.8 (L) 12.0 - 16.0 g/dL    HCT 82.929.9 (L) 56.235.0 - 45.0 %    MCV 61.1 (L) 74.0 - 97.0 FL    MCH 20.0 (L) 24.0 - 34.0 PG    MCHC 32.8 31.0 - 37.0 g/dL    RDW 13.019.4 (H) 86.511.6 - 14.5 %    PLATELET 279 135 - 420 K/uL    MPV 8.5 (L) 9.2 - 11.8 FL    NEUTROPHILS 50 40 - 73 %    LYMPHOCYTES 41 21 - 52 %    MONOCYTES 8 3 - 10 %    EOSINOPHILS 1 0 - 5 %    BASOPHILS 0 0 - 2 %    ABS. NEUTROPHILS 2.3 1.8 - 8.0 K/UL    ABS. LYMPHOCYTES 1.9 0.9 - 3.6 K/UL    ABS. MONOCYTES 0.4 0.05 - 1.2 K/UL    ABS. EOSINOPHILS 0.0 0.0 - 0.4 K/UL    ABS. BASOPHILS 0.0 0.0 - 0.1 K/UL  DF AUTOMATED     METABOLIC PANEL, BASIC    Collection Time: 03/09/14  5:10 AM   Result Value Ref Range    Sodium 138 136 - 145 mmol/L    Potassium 3.4 (L) 3.5 - 5.5 mmol/L    Chloride 106 100 - 108 mmol/L    CO2 25 21 - 32 mmol/L    Anion gap 7 3.0 - 18 mmol/L    Glucose 88 74 - 99 mg/dL    BUN 5 (L) 7.0 - 18 MG/DL    Creatinine 1.610.50 (L) 0.6 - 1.3 MG/DL    BUN/Creatinine ratio 10 (L) 12 - 20      GFR est AA >60 >60 ml/min/1.5073m2    GFR est non-AA >60 >60 ml/min/1.5673m2    Calcium 8.5 8.5 - 10.1 MG/DL   MAGNESIUM    Collection Time: 03/09/14  5:10 AM   Result Value Ref Range    Magnesium 1.5 (L) 1.8 - 2.4 mg/dL   TOTAL HCG, QT.    Collection Time: 03/09/14  5:10 AM   Result Value Ref Range    HCG, Qt. [address] (H) 0 - 10 MIU/ML

## 2014-03-11 LAB — CULTURE, URINE
Culture result:: >100000
Culture result:: >100000
Culture: >100000

## 2014-03-18 NOTE — L&D Delivery Note (Signed)
Delivery Note    Obstetrician:  Etta Grandchild, MD    Pre-Delivery Diagnosis: Term pregnancy or Induced labor    Post-Delivery Diagnosis: Living newborn infant(s) or Female    Procedure: Spontaneous vaginal delivery    Epidural: YES    Monitor:  Fetal Heart Tones - External and Uterine Contractions - Internal    Estimated Blood Loss: 250    Laceration(s):  none    Presentation: Cephalic    Fetal Description: singleton    Apgar - One Minute: 9    Apgar - Five Minutes: 9    Umbilical Cord: Cord blood sent to lab for type, Rh, and Coombs' test    Specimens: Placenta           Complications:  none      Signed By:  Etta Grandchild, MD     October 25, 2014 4:15 PM

## 2014-06-02 ENCOUNTER — Inpatient Hospital Stay: Payer: MEDICAID

## 2014-06-02 ENCOUNTER — Inpatient Hospital Stay: Admit: 2014-06-02 | Discharge: 2014-06-02 | Payer: MEDICAID | Attending: Emergency Medicine

## 2014-06-02 ENCOUNTER — Ambulatory Visit: Admit: 2014-06-02 | Payer: MEDICAID

## 2014-06-02 LAB — CBC WITH AUTOMATED DIFF
ABS. BASOPHILS: 0 10*3/uL (ref 0.0–0.1)
ABS. EOSINOPHILS: 0.1 10*3/uL (ref 0.0–0.4)
ABS. LYMPHOCYTES: 1.5 10*3/uL (ref 0.9–3.6)
ABS. MONOCYTES: 0.4 10*3/uL (ref 0.05–1.2)
ABS. NEUTROPHILS: 5.1 10*3/uL (ref 1.8–8.0)
BASOPHILS: 0 % (ref 0–2)
EOSINOPHILS: 1 % (ref 0–5)
HCT: 27.9 % — ABNORMAL LOW (ref 35.0–45.0)
HGB: 9.3 g/dL — ABNORMAL LOW (ref 12.0–16.0)
LYMPHOCYTES: 21 % (ref 21–52)
MCH: 22.4 PG — ABNORMAL LOW (ref 24.0–34.0)
MCHC: 33.3 g/dL (ref 31.0–37.0)
MCV: 67.1 FL — ABNORMAL LOW (ref 74.0–97.0)
MONOCYTES: 6 % (ref 3–10)
MPV: 9 FL — ABNORMAL LOW (ref 9.2–11.8)
NEUTROPHILS: 72 % (ref 40–73)
PLATELET: 229 10*3/uL (ref 135–420)
RBC: 4.16 M/uL — ABNORMAL LOW (ref 4.20–5.30)
RDW: 18.6 % — ABNORMAL HIGH (ref 11.6–14.5)
WBC: 7.1 10*3/uL (ref 4.6–13.2)

## 2014-06-02 LAB — POC URINE MACROSCOPIC
Bilirubin (POC): NEGATIVE
Blood (POC): NEGATIVE
Glucose, urine (POC): NEGATIVE mg/dL
Nitrite (POC): POSITIVE — AB
Protein (POC): NEGATIVE mg/dL
Spec. gravity (POC): 1.025 (ref 1.003–1.030)
pH, urine  (POC): 6 (ref 5.0–8.0)

## 2014-06-02 LAB — TYPE, ABO & RH, EXTERNAL
ABO,Rh: O POS
TYPE, ABO & RH, EXTERNAL: O POS

## 2014-06-02 LAB — HEP B SURFACE AG
Hep B surface Ag Interp.: NEGATIVE
Hepatitis B surface Ag: <0.1 Index (ref <1.00)

## 2014-06-02 LAB — TYPE & SCREEN
ABO/Rh(D): O POS
Antibody screen: NEGATIVE

## 2014-06-02 LAB — RPR, EXTERNAL

## 2014-06-02 LAB — ANTIBODY SCREEN, EXTERNAL: Antibody screen, External: NEGATIVE

## 2014-06-02 LAB — HEPATITIS B SURFACE AG, EXTERNAL: HBsAg, External: NEGATIVE

## 2014-06-02 LAB — RUBELLA AB, IGG, EXTERNAL: Rubella, External: IMMUNE

## 2014-06-02 LAB — TYPE AND SCREEN
ABO/Rh: O POS
Antibody Screen: NEGATIVE

## 2014-06-02 MED ORDER — ACETAMINOPHEN 500 MG TAB
500 mg | Freq: Once | ORAL | Status: AC
Start: 2014-06-02 — End: 2014-06-02
  Administered 2014-06-02: 21:00:00 via ORAL

## 2014-06-02 MED ORDER — LACTATED RINGERS IV
INTRAVENOUS | Status: DC
Start: 2014-06-02 — End: 2014-06-02
  Administered 2014-06-02: 20:00:00 via INTRAVENOUS

## 2014-06-02 MED FILL — LACTATED RINGERS IV: INTRAVENOUS | Qty: 1000

## 2014-06-02 MED FILL — TYLENOL EXTRA STRENGTH 500 MG TABLET: 500 mg | ORAL | Qty: 2

## 2014-06-02 NOTE — ED Notes (Signed)
"  5 months pregnant, having a lot of pain in my back and my stomach, my ankles are swelling up"

## 2014-06-02 NOTE — Progress Notes (Addendum)
G4P2 presents with lower back pain and abdominal pain with right sided pain. Reports no fetal movement Abdomen palpates semi-soft denies bleeding or leaking of fluid. Plan of care discussed with patient, patient agrees with plan of care heart rate obtained via doppler 150-155    1550 Call placed to Dr Johny Chessredein informing of patient complaints of no prenatal care, and abdominal back and right sided pain new orders received for labs, IV fluids, SVE and OB profile.    1650 Lab @ bedside to draw labs    1715 To ultrasound via wheelchair    1910 Returned from ultrasound via wheel chair verbal discharge instructions given patient verbalized understanding of teaching.

## 2014-06-03 LAB — RUBELLA AB, IGG: Rubella IgG, QL: IMMUNE

## 2014-06-03 LAB — RPR
RPR: NONREACTIVE
RPR: NONREACTIVE

## 2014-06-03 NOTE — H&P (Addendum)
High Risk Obstetrics Progress Note    Name: Linda [name] MRN: [id number]  SSN: [id number]    Date of Birth: 05-11-1990  Age: 24 y.o.  Sex: female      Subjective:         Estimated Date of Delivery: 10/23/14   Gestational Age Today: 3151w5d     Patient admitted for evaluation of contractions and back pains. States she does have mild abdominal pain, mild contractions and mild nausea/vomiting. She admits that she has not been drinking plenty of water.    Objective:     Vitals:  Height 5\' 5"  (1.651 m), weight 240 lb (108.863 kg), last menstrual period 01/16/2014, unknown if currently breastfeeding.   No data recorded.    No data recorded.     No data recorded.       Intake and Output:          Physical Exam:  Patient without distress.  Back: costovertebral angle tenderness absent  Abdomen: soft, nontender  Fundus: soft and non tender  Perineum: blood absent, amniotic fluid absent  Cervical Exam: Closed/Thick/High       Membranes:  Intact    Uterine Activity:  None    Fetal Heart Rate:  Baseline: 140 per minute        Labs:   Recent Results (from the past 36 hour(s))   POC URINE MACROSCOPIC    Collection Time: 06/02/14  3:32 PM   Result Value Ref Range    Color AMBER      Appearance HAZY      Spec. gravity (POC) 1.025 1.003 - 1.030      pH, urine  (POC) 6.0 5.0 - 8.0      Protein (POC) NEGATIVE  NEG mg/dL    Glucose, urine (POC) NEGATIVE  NEG mg/dL    Ketones (POC) TRACE (A) NEG mg/dL    Bilirubin (POC) NEGATIVE  NEG      Blood (POC) NEGATIVE  NEG      Urobilinogen (POC) 3-4 0.2 - 1.0 EU/dL    Nitrite (POC) POSITIVE (A) NEG      Leukocyte esterase (POC) TRACE (A) NEG     HEP B SURFACE AG    Collection Time: 06/02/14  4:40 PM   Result Value Ref Range    Hepatitis B surface Ag <0.10 <1.00 Index    Hep B surface Ag Interp. NEGATIVE  NEG     RPR    Collection Time: 06/02/14  4:40 PM   Result Value Ref Range    RPR NONREACTIVE NR     RUBELLA AB, IGG    Collection Time: 06/02/14  4:40 PM   Result Value Ref Range     Rubella, IgG Ql. IMMUNE IMM     TYPE & SCREEN    Collection Time: 06/02/14  4:40 PM   Result Value Ref Range    Crossmatch Expiration 06/05/2014     ABO/Rh(D) [place] POSITIVE     Antibody screen NEG    CBC WITH AUTOMATED DIFF    Collection Time: 06/02/14  4:40 PM   Result Value Ref Range    WBC 7.1 4.6 - 13.2 K/uL    RBC 4.16 (L) 4.20 - 5.30 M/uL    HGB 9.3 (L) 12.0 - 16.0 g/dL    HCT 86.527.9 (L) 78.435.0 - 45.0 %    MCV 67.1 (L) 74.0 - 97.0 FL    MCH 22.4 (L) 24.0 - 34.0 PG    MCHC 33.3 31.0 -  37.0 g/dL    RDW 16.1 (H) 09.6 - 14.5 %    PLATELET 229 135 - 420 K/uL    MPV 9.0 (L) 9.2 - 11.8 FL    NEUTROPHILS 72 40 - 73 %    LYMPHOCYTES 21 21 - 52 %    MONOCYTES 6 3 - 10 %    EOSINOPHILS 1 0 - 5 %    BASOPHILS 0 0 - 2 %    ABS. NEUTROPHILS 5.1 1.8 - 8.0 K/UL    ABS. LYMPHOCYTES 1.5 0.9 - 3.6 K/UL    ABS. MONOCYTES 0.4 0.05 - 1.2 K/UL    ABS. EOSINOPHILS 0.1 0.0 - 0.4 K/UL    ABS. BASOPHILS 0.0 0.0 - 0.1 K/UL    DF AUTOMATED         Assessment and Plan:      Active Problems:    * No active hospital problems. 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    Signed By: Basilia Jumbo, MD     June 03, 2014

## 2014-06-23 ENCOUNTER — Encounter: Attending: Obstetrics & Gynecology

## 2014-07-01 ENCOUNTER — Encounter: Attending: Obstetrics & Gynecology

## 2014-07-05 ENCOUNTER — Encounter: Attending: Obstetrics & Gynecology

## 2014-07-05 ENCOUNTER — Ambulatory Visit: Admit: 2014-07-05 | Payer: PRIVATE HEALTH INSURANCE | Attending: Advanced Practice Midwife

## 2014-07-05 ENCOUNTER — Encounter

## 2014-07-05 DIAGNOSIS — Z3492 Encounter for supervision of normal pregnancy, unspecified, second trimester: Secondary | ICD-10-CM

## 2014-07-05 MED ORDER — PRENATAL VITAMIN,CALCIUM,MINERALS-IRON-FOLIC ACID TABLET
ORAL_TABLET | Freq: Every day | ORAL | Status: DC
Start: 2014-07-05 — End: 2017-08-06

## 2014-07-05 NOTE — Patient Instructions (Signed)
Weeks 22 to 26 of Your Pregnancy: Care Instructions  Your Care Instructions     As you enter your 7th month of pregnancy at week 26, your baby's lungs are growing stronger and getting ready to breathe. You may notice that your baby responds to the sound of your or your partner's voice. You may also notice that your baby does less turning and twisting and more squirming or jerking. Jerking often means that your baby has the hiccups. Hiccups are perfectly normal and are only temporary.  You may want to think about attending a childbirth preparation class. This is also a good time to start thinking about whether you want to have pain medicine during labor.  Most pregnant women are tested for gestational diabetes between weeks 24 and 28. Gestational diabetes occurs when your blood sugar level gets too high when you're pregnant. The test is important, because you can have gestational diabetes and not know it. But the condition can cause problems for your baby.  Follow-up care is a key part of your treatment and safety. Be sure to make and go to all appointments, and call your doctor if you are having problems. It's also a good idea to know your test results and keep a list of the medicines you take.  How can you care for yourself at home?  Ease discomfort from your baby's kicking  ?? Change your position. Sometimes this will cause your baby to change position too.  ?? Take a deep breath while you raise your arm over your head. Then breathe out while you drop your arm.  Do Kegel exercises to prevent urine from leaking  ?? You can do Kegel exercises while you stand or sit.  ?? Squeeze the same muscles you would use to stop your urine. Your belly and thighs should not move.  ?? Hold the squeeze for 3 seconds, and then relax for 3 seconds.  ?? Start with 3 seconds. Then add 1 second each week until you are able to squeeze for 10 seconds.  ?? Repeat the exercise 10 to 15 times for each session. Do three or more sessions each day.   Ease or reduce swelling in your feet, ankles, hands, and fingers  ?? If your fingers are puffy, take off your rings.  ?? Do not eat high-salt foods, such as potato chips.  ?? Prop up your feet on a stool or couch as much as possible. Sleep with pillows under your feet.  ?? Do not stand for long periods of time or wear tight shoes.  ?? Wear support stockings.   Where can you learn more?   Go to http://www.healthwise.net/[facility]  Enter G264 in the search box to learn more about "Weeks 22 to 26 of Your Pregnancy: Care Instructions."   ?? 2006-2016 Healthwise, Incorporated. Care instructions adapted under license by [employer] (which disclaims liability or warranty for this information). This care instruction is for use with your licensed healthcare professional. If you have questions about a medical condition or this instruction, always ask your healthcare professional. Healthwise, Incorporated disclaims any warranty or liability for your use of this information.  Content Version: 10.8.513193; Current as of: Aug 06, 2013

## 2014-07-05 NOTE — Progress Notes (Signed)
Pt states she was told at the hospital that she needs to be back on her thyroid medication. Pt reports taking synthroid but does not remember the strength. 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

## 2014-07-05 NOTE — Progress Notes (Signed)
PRENATAL INTAKE SUMMARY    Linda [name] presents today for her first prenatal visit. Patient's last menstrual period was 01/16/2014 (exact date). She is 24w 2d performed by 2nd trim [place] at 19wks. She is a [id number].    She has a hx of hypothyroidism but is not under care and is not taking synthroid.    OB History   Gravida Para Term Preterm AB SAB TAB Ectopic Multiple Living   # Outcome Date GA Lbr Len/2nd Weight Sex Delivery Anes PTL Lv   4 Current            3 Term 03/12/13 [redacted]w[redacted]d  7 lb 15 oz (3.6 kg) M VAGINAL DELI EPIDURAL AN N Y   2 Term 08/28/11 [redacted]w[redacted]d  7 lb 13.1 oz (3.547 kg) F VAGINAL DELI EPIDURAL AN N Y   1 Gravida               Comments: System Generated. Please review and update pregnancy details.          I have reviewed the patient's medical, obstetrical, social, and family histories, medications, and available lab results.  Past Medical History   Diagnosis Date   ??? Morbid obesity (HCC)    ??? Hypothyroidism    ??? Psychiatric problem      ADHD     Past Surgical History   Procedure Laterality Date   ??? Hx dilation and curettage  09/2013     Family History   Problem Relation Age of Onset   ??? Hypertension Maternal Grandmother    ??? Hypertension Maternal Grandfather    ??? Hypertension Paternal Grandfather    ??? Hypertension Mother    ??? Asthma Mother    ??? No Known Problems Brother    ??? No Known Problems Sister      History     Social History   ??? Marital Status: SINGLE     Spouse Name: N/A   ??? Number of Children: N/A   ??? Years of Education: N/A     Social History Main Topics   ??? Smoking status: Never Smoker    ??? Smokeless tobacco: Not on file   ??? Alcohol Use: No   ??? Drug Use: No   ??? Sexual Activity:     Partners: Male     Pharmacist, hospital Protection: None     Other Topics Concern   ??? None     Social History Narrative       Subjective:   She complains of backache in lower back that is dull and constant. She denies taking OTC tylenol.    Objective:   Initial Physical Exam (New OB)     GENERAL APPEARANCE: alert, well appearing, in no apparent distress, oriented to person, place and time, overweight  THYROID: no thyromegaly or masses present  BREASTS: no masses noted, no significant tenderness, no palpable axillary nodes, no skin changes  LUNGS: clear to auscultation, no wheezes, rales or rhonchi, symmetric air entry  HEART: regular rate and rhythm, no murmurs  ABDOMEN: fundus soft, nontender 24 cm and FHT present  SKIN: normal coloration and turgor, no rashes  PELVIC EXAM: EXTERNAL GENITALIA: normal appearing vulva with no masses, tenderness or lesions  VAGINA: no abnormal discharge or lesions  CERVIX: no lesions or cervical motion tenderness  UTERUS: gravid  ADNEXA: no masses palpable and nontender  OB EXAM PELVIMETRY: appears adequate    No  CVA tenderness    Assessment/Plan:   Normal pregnancy  Routine Prenatal care  [facility]  Referred to PT for back pain, encouraged to take OTC tylenol as needed, maternity band and apply hot compress  RTC in 2 wks  1hr GTT next visit  Reviewed morph US - no abnormalities    ICD-10-CM ICD-9-CM    1. Supervision of normal pregnancy, second trimester Z34.92 V22.1 prenatal multivit-ca-min-fe-fa (PRENATAL VITAMIN) tab      TSH+T3+FREE T4+T3 FREE      CT/NG/T.VAGINALIS,BY NAA

## 2014-07-07 LAB — CT/NG/T.VAGINALIS AMPLIFICATION
C. trachomatis by NAA: POSITIVE — AB
N. gonorrhoeae by NAA: POSITIVE — AB
T. vaginalis by NAA: NEGATIVE

## 2014-07-07 LAB — N GONORRHOEAE, DNA PROBE, EXTERNAL: Gonorrhea, External: POSITIVE

## 2014-07-07 LAB — CHLAMYDIA DNA PROBE, EXTERNAL: Chlamydia, External: POSITIVE

## 2014-07-08 ENCOUNTER — Encounter

## 2014-07-08 MED ORDER — AZITHROMYCIN 250 MG TAB
250 mg | ORAL_TABLET | ORAL | Status: AC
Start: 2014-07-08 — End: 2014-07-13

## 2014-07-08 MED ORDER — CEFTRIAXONE 250 MG SOLUTION FOR INJECTION
250 mg | Freq: Once | INTRAMUSCULAR | Status: AC
Start: 2014-07-08 — End: 2014-07-08

## 2014-07-08 NOTE — Progress Notes (Signed)
Positive STDs. Rx sent to pharmacy. Message sent to nursing to notify pt.

## 2014-07-08 NOTE — Telephone Encounter (Signed)
Pt aware that she has CT and GC and transferred to the front to schedule a visit for Rocephin injection.

## 2014-07-08 NOTE — Progress Notes (Signed)
Please schedule pt to RTC for injection. Thanks!

## 2014-07-08 NOTE — Telephone Encounter (Signed)
-----   Message from [place], [place] sent at 07/08/2014  2:20 PM EDT -----  Please let Ms.Sassano know that she has CT and GC. Her Rx has been sent to her pharmacy. She will also need to RTC for Rocephin injection ASAP.     I would encourage her partner to be treated also either at his/her PCP or the Health Dept. She should abstain from sex at least 7-10 days after intercourse. Thanks!

## 2014-07-08 NOTE — Progress Notes (Signed)
+  CT/GC noted on labs. Rx sent to pharmacy. Pt to return to office for Rocephin injection.

## 2014-07-12 ENCOUNTER — Encounter: Attending: Advanced Practice Midwife

## 2014-07-23 ENCOUNTER — Inpatient Hospital Stay: Payer: MEDICAID

## 2014-07-23 LAB — N GONORRHOEAE, DNA PROBE, EXTERNAL: Gonorrhea, External: POSITIVE

## 2014-07-23 LAB — CHLAMYDIA DNA PROBE, EXTERNAL: Chlamydia, External: POSITIVE

## 2014-07-23 MED ORDER — CEFTRIAXONE 250 MG SOLUTION FOR INJECTION
250 mg | Freq: Once | INTRAMUSCULAR | Status: AC
Start: 2014-07-23 — End: 2014-07-23
  Administered 2014-07-23: 20:00:00 via INTRAMUSCULAR

## 2014-07-23 MED ORDER — AZITHROMYCIN 250 MG TAB
250 mg | Freq: Every day | ORAL | Status: DC
Start: 2014-07-23 — End: 2014-07-23
  Administered 2014-07-23: 20:00:00 via ORAL

## 2014-07-23 MED ORDER — ACETAMINOPHEN 500 MG TAB
500 mg | ORAL | Status: AC
Start: 2014-07-23 — End: 2014-07-23
  Administered 2014-07-23: 18:00:00 via ORAL

## 2014-07-23 MED FILL — CEFTRIAXONE 250 MG SOLUTION FOR INJECTION: 250 mg | INTRAMUSCULAR | Qty: 250

## 2014-07-23 MED FILL — TYLENOL EXTRA STRENGTH 500 MG TABLET: 500 mg | ORAL | Qty: 2

## 2014-07-23 MED FILL — AZITHROMYCIN 250 MG TAB: 250 mg | ORAL | Qty: 4

## 2014-07-23 NOTE — Progress Notes (Addendum)
G4 P2 presents via medics with vaginal bleeding  And cramping since 0900 today reports bright red in color patient reports the bleeding filled up her tissue paper  She used tissue paper because she had no pads. Patient reports no fetal movement today. Reports lower abdominal cramping  Which she rates 9/10 and lower back pain 7/10. Patient has hx of depression Bipolar disorder ADHD and obsessive complusive disorder. Also has hx of hypothyrodism. Patient was positive for  Gonorrhea and trichamonatis stated she was unaware and was not informed of meds called into pharmacy for her.    1350 Called placed to Dr Welton FlakesKhan  Informed of patient complaints and being positive for STD New orders received to monitor patient and bleeding and administer rocephin and zithromax  Patient instructed on the importance of her partner being treated to prevent reoccurence Patient verbalized understanding of teaching.    1530 patient up to void and ambulate no bleeding seen on pad reports no further cramping @ this time.    1550 Patient discharged to home via ambulatory. Patient given verbal discharge instructions verbalized understanding of teaching.

## 2014-07-23 NOTE — H&P (Signed)
History and Physical      Pt is a G4P2002 at 26.6wk who presented to L&D complaining of vaginal bleeding.  Her prenatal course has been complicated by GC/chl which [facility]  Please refer to prenatal record for complete information regarding prenatal course.    AFVSS  FHT: cat 1  Toco: none  SVE: cl.th with minimal blood noted  Membranes: intact    A/P:  Reassuring maternal and fetal status.   Pt given Rocephin 250 mg IM and zithromax 1 g po.  Instructed to get partner treated.  This is probably the cause of her bleeding.   Discharge home   F/u with primary OB.

## 2014-07-23 NOTE — Progress Notes (Signed)
G4P2 @ 26.[redacted] wksga. Presents to triage via ambulance for complaints of bleeding. States "tissue placed btw legs filled up quickly with blood". No visible sign of bleeding noted. Denies LOF. External monitors placed. See flow sheet for VS. Urine sample obtained.

## 2014-07-27 ENCOUNTER — Ambulatory Visit: Admit: 2014-07-27 | Payer: PRIVATE HEALTH INSURANCE | Attending: Obstetrics & Gynecology

## 2014-07-27 DIAGNOSIS — Z3482 Encounter for supervision of other normal pregnancy, second trimester: Secondary | ICD-10-CM

## 2014-07-29 MED ORDER — LEVOTHYROXINE 75 MCG TAB
75 mcg | ORAL_TABLET | Freq: Every day | ORAL | Status: DC
Start: 2014-07-29 — End: 2018-01-09

## 2014-07-29 MED ORDER — PRENATAL VITAMIN WITH CALCIUM NO.72-IRON-FA 27 MG-1 MG TAB
27 mg iron- 1 mg | ORAL_TABLET | Freq: Every day | ORAL | Status: DC
Start: 2014-07-29 — End: 2014-09-07

## 2014-07-29 NOTE — Telephone Encounter (Signed)
Patient needs synthroid called to her pharmacy as well as prenatal vitamins. She also needs her paperwork completed stating she is on bed rest for her employer. She states she dropped off the papers on Wednesday and has yet to hear back about them.

## 2014-07-29 NOTE — Telephone Encounter (Signed)
Synthroid and vitamins e scribed.

## 2014-08-03 NOTE — Progress Notes (Signed)
Please see flow sheet.  28 weeks 3 days doing well.  Follow up in 2 weeks.  Gtt in progress.

## 2014-08-03 NOTE — Patient Instructions (Signed)
Blood Glucose: About This Test  What is it?  A blood glucose test measures the amount of a type of sugar, called glucose, in your blood. Several different types of blood glucose tests are used.  ?? Fasting blood sugar (FBS) measures blood glucose after you have not eaten for at least 8 hours. It is often the first test done to check for prediabetes and diabetes.  ?? Random blood sugar (RBS) measures blood glucose regardless of when you last ate.  ?? Oral glucose tolerance test (OGTT) may be used to diagnose prediabetes and diabetes. This test is a series of blood glucose measurements taken after you drink a sweet liquid that contains glucose. This test is mostly used for pregnant women to check for gestational diabetes.  Why is this test done?  Blood glucose tests are done to:  ?? Check for diabetes.  ?? See how well treatment for diabetes is working.  The A1c is a different type of blood test used to diagnose diabetes.  How can you prepare for the test?  Be sure to tell your doctor about all the nonprescription and prescription medicines and herbs or other supplements you take. There are many medicines and supplements that can affect the results of these tests.  Fasting blood sugar (FBS)   For a fasting blood sugar test, do not eat or drink anything other than water for at least 8 hours before the blood sample is taken.  If you have diabetes, you may be asked to wait until you have had your blood tested before taking your morning dose of insulin or diabetes medicine.  Random blood sugar (RBS)   No special preparation is needed before having a random blood sugar test.  Oral glucose tolerance test   In the 3 days before this test, eat the way you are used to eating. In general, this means a well-balanced diet that contains at least 150 grams of carbohydrate a day. Good foods to eat are fruits, breads, cereals, grains, rice, crackers, potatoes, beans, and corn.   In the 8 hours right before the test, do not eat or drink anything (except water). Do not smoke, drink alcohol, or exercise.  What happens during the test?  A health professional takes a sample of your blood.  For a home glucose test, you may have a blood sample taken from your fingertip. This is done by:  ?? Pricking your finger with a small needle (lancet) to collect a drop of blood.  ?? Placing the blood on a special test strip.  ?? Putting the test strip into a device called a blood glucose meter.  For the oral glucose tolerance test:  ?? A blood sample will be taken when you arrive. This is your fasting blood glucose value. It will be compared with other samples during the day.  ?? You will drink a sweet glucose liquid.  ?? Depending on the reason for this test, blood samples may be taken 1, 2, or 3 hours after you drink the glucose. Blood samples may also be taken as soon as 30 minutes after you drink the glucose.  What happens after the test?  ?? You will probably be able to go home right away.  ?? You can go back to your usual activities right away.  When should you call for help?  Watch closely for changes in your health, and be sure to contact your doctor if you have any problems.  Follow-up care is a key part of your   treatment and safety. Be sure to make and go to all appointments, and call your doctor if you are having problems. It's also a good idea to keep a list of the medicines you take. Ask your doctor when you can expect to have your test results.   Where can you learn more?   Go to http://www.healthwise.net/[facility]  Enter G279 in the search box to learn more about "Blood Glucose: About This Test."   ?? 2006-2016 Healthwise, Incorporated. Care instructions adapted under license by [employer] (which disclaims liability or warranty for this information). This care instruction is for use with your licensed healthcare professional. If you have questions about a medical condition  or this instruction, always ask your healthcare professional. Healthwise, Incorporated disclaims any warranty or liability for your use of this information.  Content Version: 10.8.513193; Current as of: Aug 06, 2013

## 2014-08-08 ENCOUNTER — Encounter

## 2014-08-10 ENCOUNTER — Ambulatory Visit: Admit: 2014-08-10 | Payer: PRIVATE HEALTH INSURANCE | Attending: Obstetrics & Gynecology

## 2014-08-10 ENCOUNTER — Encounter

## 2014-08-10 DIAGNOSIS — Z3483 Encounter for supervision of other normal pregnancy, third trimester: Secondary | ICD-10-CM

## 2014-08-10 LAB — N GONORRHOEAE, DNA PROBE, EXTERNAL: Gonorrhea, External: NEGATIVE

## 2014-08-10 LAB — CHLAMYDIA DNA PROBE, EXTERNAL: Chlamydia, External: NEGATIVE

## 2014-08-10 NOTE — Patient Instructions (Signed)
Weeks 26 to 30 of Your Pregnancy: Care Instructions  Your Care Instructions     You are now in your last trimester of pregnancy. Your baby is growing rapidly. And you'll probably feel your baby moving around more often. Your doctor may ask you to count your baby's kicks.  Your back may ache as your body gets used to your baby's size and length.  If you haven't already had the Tdap shot during this pregnancy, talk to your doctor about getting it. It will help protect your newborn against pertussis infection.  During this time, it's important to take care of yourself and pay attention to what your body needs. If you feel sexual, explore ways to be close with your partner that match your comfort and desire. Use the tips provided in this care sheet to find ways to be sexual in your own way.  Follow-up care is a key part of your treatment and safety. Be sure to make and go to all appointments, and call your doctor if you are having problems. It's also a good idea to know your test results and keep a list of the medicines you take.  How can you care for yourself at home?  Take it easy at work  ?? Take frequent breaks. If possible, stop working when you are tired, and rest during your lunch hour.  ?? Take bathroom breaks every 2 hours.  ?? Change positions often. If you sit for long periods, stand up and walk around.  ?? When you stand for a long time, keep one foot on a low stool with your knee bent. After standing a lot, sit with your feet up.  ?? Avoid fumes, chemicals, and tobacco smoke.  Be sexual in your own way  ?? Having sex during pregnancy is okay, unless your doctor tells you not to.  ?? You may be very interested in sex, or you may have no interest at all.  ?? Your growing belly can make it hard to find a good position during intercourse. Experiment and explore.  ?? You may get cramps in your uterus when your partner touches your breasts.  ?? A back rub may relieve the backache or cramps that sometimes follow orgasm.   Learn about preterm labor  ?? Watch for signs of preterm labor. You may be going into labor if:  ?? You have menstrual-like cramps, with or without nausea.  ?? You have about 4 or more contractions in 20 minutes, or about 8 or more within 1 hour, even after you have had a glass of water and are resting.  ?? You have a low, dull backache that does not go away when you change your position.  ?? You have pain or pressure in your pelvis that comes and goes in a pattern.  ?? You have intestinal cramping or flu-like symptoms, with or without diarrhea.  ?? You notice an increase or change in your vaginal discharge. Discharge may be heavy, mucus-like, watery, or streaked with blood.  ?? Your water breaks.  ?? If you think you have preterm labor:  ?? Drink 2 or 3 glasses of water or juice. Not drinking enough fluids can cause contractions.  ?? Stop what you are doing, and empty your bladder. Then lie down on your left side for at least 1 hour.  ?? While lying on your side, find your breast bone. Put your fingers in the soft spot just below it. Move your fingers down toward your belly button to   find the top of your uterus. Check to see if it is tight.  ?? Contractions can be weak or strong. Record your contractions for an hour. Time a contraction from the start of one contraction to the start of the next one.  ?? Single or several strong contractions without a pattern are called Braxton-Hicks contractions. They are practice contractions but not the start of labor. They often stop if you change what you are doing.  ?? Call your doctor if you have regular contractions.  Where can you learn more?  Go to http://www.healthwise.net/[facility]  Enter S999 in the search box to learn more about "Weeks 26 to 30 of Your Pregnancy: Care Instructions."  ?? 2006-2016 Healthwise, Inc[employer]. Care instructions adapted under license by [employer] (which disclaims liability or warranty  for this information). This care instruction is for use with your licensed healthcare professional. If you have questions about a medical condition or this instruction, always ask your healthcare professional. Healthwise, Incorporated disclaims any warranty or liability for your use of this information.  Content Version: 10.9.538570; Current as of: Aug 06, 2013

## 2014-08-10 NOTE — Progress Notes (Signed)
Please see flow sheet.  29 weeks 3 days doing well.  C/chlamydia TOC-done  Follow up in 2 weeks.  Repeat GTT

## 2014-08-11 ENCOUNTER — Encounter: Attending: Advanced Practice Midwife

## 2014-08-12 LAB — CT/NG/T.VAGINALIS AMPLIFICATION
C. trachomatis by NAA: NEGATIVE
N. gonorrhoeae by NAA: NEGATIVE
T. vaginalis by NAA: NEGATIVE

## 2014-09-07 ENCOUNTER — Ambulatory Visit: Admit: 2014-09-07 | Payer: PRIVATE HEALTH INSURANCE | Attending: Advanced Practice Midwife

## 2014-09-07 ENCOUNTER — Inpatient Hospital Stay: Payer: MEDICAID

## 2014-09-07 ENCOUNTER — Encounter

## 2014-09-07 ENCOUNTER — Inpatient Hospital Stay: Admit: 2014-09-07 | Discharge: 2014-09-07 | Payer: MEDICAID | Attending: Emergency Medicine

## 2014-09-07 DIAGNOSIS — Z3493 Encounter for supervision of normal pregnancy, unspecified, third trimester: Secondary | ICD-10-CM

## 2014-09-07 NOTE — Progress Notes (Signed)
Jacqualyn is 33w 3d  C/o pelvic pain that is consistent with round ligament pain  Denies VB or LOF, +FM  Reviewed exercise and stretches, referred to [facility] -- gave tel #  Taking synthroid daily -- referred to [facility] -- gave tel # to schedule appointment  Pt refused 1 hr GTT - states she has taken it 2x and has vomited the glucola each time, discussed risk of DM in pregnancy including macrosomia and stillbirth  Will do random glucose today  Having braxton hicks ctx off/on and is concerned "I may be dilating", offered pelvic exam to reassure pt, she declines  Routine OB visit  Discussed when to visit hospital - she verbalizes understanding of all teaching  RTC in 2 wks

## 2014-09-07 NOTE — Patient Instructions (Signed)
Weeks 32 to 34 of Your Pregnancy: Care Instructions  Your Care Instructions     During the last few weeks of your pregnancy, you may have more aches and pains. It's important to rest when you can.  Your growing baby is putting more pressure on your bladder. So you may need to urinate more often. Hemorrhoids are also common. These are painful, itchy veins in the rectal area.  In the 36th week, most women have a test for group B streptococcus (GBS). GBS is a common bacteria that can live in the vagina and rectum. It can make your baby sick after birth. If you test positive, you will get antibiotics during labor. These will keep your baby from getting the bacteria.  You may want to talk with your doctor about banking your baby's umbilical cord blood. This is the blood left in the cord after birth. If you want to save this blood, you must arrange it ahead of time. You can't decide at the last minute.  If you haven't already had the Tdap shot during this pregnancy, talk to your doctor about getting it. It will help protect your newborn against pertussis infection.  Follow-up care is a key part of your treatment and safety. Be sure to make and go to all appointments, and call your doctor if you are having problems. It's also a good idea to know your test results and keep a list of the medicines you take.  How can you care for yourself at home?  Ease hemorrhoids  ?? Get more liquids, fruits, vegetables, and fiber in your diet. This will help keep your stools soft.  ?? Avoid sitting for too long. Lie on your left side several times a day.  ?? Clean yourself with soft, moist toilet paper. Or you can use witch hazel pads or personal hygiene pads.  ?? If you are uncomfortable, try ice packs. Or you can sit in a warm sitz bath. Do these for 20 minutes at a time, as needed.  ?? Use hydrocortisone cream for pain and itching. Two examples are Anusol and Preparation H Hydrocortisone.   ?? Ask your doctor about taking an over-the-counter stool softener.  Consider breastfeeding  ?? Experts recommend that women breastfeed for 1 year or longer. Breast milk is the perfect food for babies.  ?? Breast milk is easier for babies to digest than formula. And it is always available, just the right temperature, and free.  ?? In general, babies who are breastfed are healthier than formula-fed babies.  ?? Breastfed babies are less likely to get ear infections, colds, diarrhea, and pneumonia.  ?? Breastfed babies who are fed only breast milk are less likely to get asthma and allergies.  ?? Breastfed babies are less likely to be obese.  ?? Breastfed babies are less likely to get diabetes or heart disease.  ?? Women who breastfeed have less bleeding after the birth. Their uteruses also shrink back faster.  ?? Some women who breastfeed lose weight faster. Making milk burns calories.  ?? Breastfeeding can lower your risk of breast cancer, ovarian cancer, and osteoporosis.  Decide about circumcision for boys  ?? As you make this decision, it may help to think about your personal, religious, and family traditions. You get to decide if you will keep your son's penis natural or if he will be circumcised.  ?? If you decide that you would like to have your baby circumcised, talk with your doctor. You can share your concerns about   pain. And you can discuss your preferences for anesthesia.  Where can you learn more?  Go to http://www.healthwise.net/[facility]  Enter X711 in the search box to learn more about "Weeks 32 to 34 of Your Pregnancy: Care Instructions."  ?? 2006-2016 Healthwise, Inc[employer]. Care instructions adapted under license by [employer] (which disclaims liability or warranty for this information). This care instruction is for use with your licensed healthcare professional. If you have questions about a medical condition or this instruction, always ask your healthcare professional. Healthwise,  [employer]corpor[employer] disclaims any warranty or liability for your use of this information.  Content Version: 10.9.538570; Current as of: Aug 06, 2013

## 2014-09-07 NOTE — Progress Notes (Signed)
1335: Rcd g4p2 @ [redacted]w[redacted]d c/o "I lost my mucus plug", pt also reports irregular ctx and pelvic pain. Pt denies vag bleeding today, or any leakage of vaginal fluid. Reports active fetal movement. EFM and toco applied, abd soft and nontender to palpation. Pt reports calling the office with these complaints yesterday and was then scheduled for appt this morning, but pt verbalizes that she is upset because she did not have her cervix checked. Pt aware of POC to monitor    1345: Dr Samuel Bouche aware of pts arrival and complaints as well as reactive NST and no ctx palpable or on monitor. Orders to discharge pt home but to offer SVE for reassurance.    1410: SVE closed/thick/high. Verbal and written d/c instructions given to pt, pt states understanding.

## 2014-09-07 NOTE — Addendum Note (Signed)
Addended by: Ailene Ards on: 09/07/2014 11:39 AM      Modules accepted: Orders

## 2014-09-08 LAB — GLUCOSE, RANDOM: Glucose: 72 mg/dL (ref 65–99)

## 2014-09-21 ENCOUNTER — Inpatient Hospital Stay: Payer: MEDICAID

## 2014-09-21 ENCOUNTER — Encounter: Attending: Obstetrics & Gynecology

## 2014-09-21 MED ORDER — LIDOCAINE (PF) 10 MG/ML (1 %) IJ SOLN
10 mg/mL (1 %) | INTRAMUSCULAR | Status: AC
Start: 2014-09-21 — End: 2014-09-21
  Administered 2014-09-21: 05:00:00 via INTRAMUSCULAR

## 2014-09-21 MED ORDER — ACETAMINOPHEN 500 MG TAB
500 mg | Freq: Once | ORAL | Status: AC
Start: 2014-09-21 — End: 2014-09-21
  Administered 2014-09-21: 05:00:00 via ORAL

## 2014-09-21 MED FILL — TYLENOL EXTRA STRENGTH 500 MG TABLET: 500 mg | ORAL | Qty: 2

## 2014-09-21 MED FILL — CEFTRIAXONE 250 MG SOLUTION FOR INJECTION: 250 mg | INTRAMUSCULAR | Qty: 250

## 2014-09-21 NOTE — H&P (Signed)
Ante Partum High Risk Pregnancy Note    Patient admitted for pelvic pain and pressure at [redacted] weeks gestation. She denied leakage of fluid, bleeding or contractions.   LOS:  0  Vitals: Temp (24hrs), Avg:98.7 ??F (37.1 ??C), Min:98.7 ??F (37.1 ??C), Max:98.7 ??F (37.1 ??C)   Patient Vitals for the past 24 hrs:   BP   09/21/14 0030 124/43 mmHg   09/21/14 0015 129/52 mmHg   09/21/14 0005 107/73 mmHg   09/20/14 2359 159/81 mmHg   09/20/14 2349 149/73 mmHg       I&O:                      Exam:  Patient without distress.               Abdomen: soft, non-tender               Fundus: soft and non tender               Fundal Height: 35 cm               Right Upper Quadrant: non-tender               Perineum: No sign of blood or amniotic fluid               Lower Extremities: No               Patellar Reflexes:                Clonus:                Fetal Monitoring:  Variability: Good {> 6 bpm)   Uterine Activity: None    Dilation: 1 cm     Effacement: Long    Station: -3               NST:  reactive           Lab/Data Review:  All lab results for the last 24 hours reviewed.    Assessment and Plan:      Pelvic pain and pressure in pregnancy.    Patient discharged home on pelvic rest and Tylenol.

## 2014-09-21 NOTE — Progress Notes (Addendum)
2345: Rcd G4P2 7327w2d patient to room 2224 with complains of rectal, pelvic and abd pain. Pt states it has been going on for 3 days but worsened today. EFM and TOCO applied, abd soft to palpation. No ctx palpated. Pt denies vag bleeding, discharge, or LOF. Reports pos fetal movement. Pregnancy complicated with gestational diabetes. Consult with MFM scheduled. Denies other issues with her pregnancy.     0028: Spoke with Dr Samuel BoucheLucas regarding patient's arrival, complaints, history, UA results, reactive NST, ctx pattern, and Vital signs) incluidng BP. New orders rcd. Encourage PO hydration. Pt may be discharged following med administration.     [id number]: Up to bathroom, tolerated well.     0105: Verbal and written discharge instructions given to patient. Pt verbalizes understanding.     015: Pt ambulated off unit in good condition with discharge instructions in hand. Pts sister picking her up.

## 2014-09-22 LAB — POC URINE MACROSCOPIC
Bilirubin (POC): NEGATIVE
Glucose, urine (POC): NEGATIVE mg/dL
Ketones (POC): NEGATIVE mg/dL
Nitrite (POC): POSITIVE — AB
Protein (POC): 300 mg/dL — AB
Spec. gravity (POC): >1.03 — ABNORMAL HIGH (ref 1.003–1.030)
Urobilinogen (POC): 1 EU/dL (ref 0.2–1.0)
pH, urine  (POC): 6.5 (ref 5.0–8.0)

## 2014-09-23 ENCOUNTER — Encounter: Attending: Obstetrics & Gynecology

## 2014-09-26 ENCOUNTER — Encounter: Attending: Obstetrics & Gynecology

## 2014-10-04 ENCOUNTER — Encounter: Attending: Obstetrics & Gynecology

## 2014-10-11 ENCOUNTER — Ambulatory Visit: Admit: 2014-10-11 | Payer: PRIVATE HEALTH INSURANCE | Attending: Obstetrics & Gynecology

## 2014-10-11 DIAGNOSIS — Z3493 Encounter for supervision of normal pregnancy, unspecified, third trimester: Secondary | ICD-10-CM

## 2014-10-11 NOTE — Progress Notes (Signed)
G 4 P 2@ 38 w 1 d.  Has no complaints.  See prenatal flow sheet.  Normal prenatal exam.   RTC 1 week.

## 2014-10-11 NOTE — Patient Instructions (Signed)
Week 38 of Your Pregnancy: Care Instructions  Your Care Instructions     Believe it or not, your baby is almost here. You may have ideas about your baby's personality because of how much he or she moves. Or you may have noticed how he or she responds to sounds, warmth, cold, and light. You may even know what kind of music your baby likes.  By now, you have a better idea of what to expect during delivery. You may have talked about your birth preferences with your doctor. But even if you want a vaginal birth, it is a good idea to learn about cesarean births. Cesarean birth means that your baby is born through a cut (incision) in your lower belly. It is sometimes the best choice for the health of the baby and the mother.  This care sheet can help you understand cesarean births. It also gives you information about what to expect after your baby is born. And it helps you understand more about postpartum depression.  Follow-up care is a key part of your treatment and safety. Be sure to make and go to all appointments, and call your doctor if you are having problems. It's also a good idea to know your test results and keep a list of the medicines you take.  How can you care for yourself at home?  Learn about cesarean birth   ?? Most C-sections are unplanned. They are done because of problems that occur during labor. These problems might include:  ?? Labor that slows or stops.  ?? High blood pressure or other problems for the mother.  ?? Signs of distress in the baby. These signs may include a very fast or slow heart rate.  ?? Although most mothers and babies do well after C-section, it is major surgery. It has more risks than a vaginal delivery.  ?? In some cases, a planned C-section may be safer than a vaginal delivery. This may be the case if:  ?? The mother has a health problem, such as a heart condition.  ?? The baby isn't in a head-down position for delivery. This is called a breech position.   ?? The uterus has scars from past surgeries. This could increase the chance of a tear in the uterus.  ?? There is a problem with the placenta.  ?? The mother has an infection, such as genital herpes, that could be spread to the baby.  ?? The mother is having twins or more.  ?? The baby weighs 9 to 10 pounds or more.  ?? Because of the risks of C-section, planned C-sections generally should be done only for medical reasons. And a planned C-section should be done at 39 weeks or later unless there is a medical reason to do it sooner.  Know what to expect after delivery, and plan for the first few weeks at home  ?? You, your baby, and your partner or coach will get identification bands. Only people with matching bands can pick up the baby from the nursery.  ?? You will learn how to feed, diaper, and bathe your baby. And you will learn how to care for the umbilical cord stump. If your baby will be circumcised, you will also learn how to care for that.  ?? Ask people to wait to visit you until you are at home. And ask them to wash their hands before they touch your baby.  ?? Make sure you have another adult in your home for at least   2 or 3 days after the birth.  ?? During the first 2 weeks, limit when friends and family can visit.  ?? Do not allow visitors who have colds or infections. Make sure all visitors are up to date with their vaccinations. Never let anyone smoke around your baby.  ?? Try to nap when the baby naps.  Be aware of postpartum depression  ?? "Baby blues" are common for the first 1 to 2 weeks after birth. You may cry or feel sad or irritable for no reason.  ?? For some women, these feelings last longer and are more intense. This is called postpartum depression.  ?? If your symptoms last for more than a few weeks or you feel very depressed, ask your doctor for help.  ?? Postpartum depression can be treated. Support groups and counseling can help. Sometimes medicine can also help.  Where can you learn more?   Go to http://www.healthwise.net/[facility]  Enter B044 in the search box to learn more about "Week 38 of Your Pregnancy: Care Instructions."  ?? 2006-2016 Health[employer], Inc[employer]. Care instructions adapted under license by [employer] (which disclaims liability or warranty for this information). This care instruction is for use with your licensed healthcare professional. If you have questions about a medical condition or this instruction, always ask your healthcare professional. Healthwise, Incorporated disclaims any warranty or liability for your use of this information.  Content Version: 10.9.538570; Current as of: February 22, 2014

## 2014-10-15 ENCOUNTER — Inpatient Hospital Stay: Payer: MEDICAID

## 2014-10-15 NOTE — Progress Notes (Addendum)
23YO G4P2 @ [redacted]w[redacted]d with co vagina/ rectal pressure worsening over last 2 days. Pt reported little to no movement since 2pm, Denied vaginal bleeding and LOF. CLean catch urine collected on arrival. Pt reported Dr Ree Edman check in office was 2cm. Pt voiced frustration that she has so much pressure down there and that she would be upset if i told her she was not in labor. Encouragement provided.   2015 SVE 2/50/-2 no bleeding or LOF. Reviewed UA and pt given 2 large cups water. R tilt.Told pt waiting for reactive fetal tracing before discharge home.   2035 Called Dr Nedra Hai, conveyed above information. Negative nitrates, ketones, trace, pro 30, SG >1.030. Order taken for discharge, ambien.  2055 Reviewed labor precautions, pt given ambien. Ambulated off unit in no apparent distress.

## 2014-10-16 LAB — POC URINE MACROSCOPIC
Glucose, urine (POC): NEGATIVE mg/dL
Leukocyte esterase (POC): NEGATIVE
Nitrite (POC): NEGATIVE
Protein (POC): 30 mg/dL — AB
Spec. gravity (POC): >=1.03 (ref 1.003–1.030)
Urobilinogen (POC): 1 EU/dL (ref 0.2–1.0)
pH, urine  (POC): 6.5 (ref 5.0–8.0)

## 2014-10-16 MED ORDER — ZOLPIDEM 5 MG TAB
5 mg | Freq: Every evening | ORAL | Status: DC | PRN
Start: 2014-10-16 — End: 2014-10-15
  Administered 2014-10-16: 01:00:00 via ORAL

## 2014-10-16 MED FILL — ZOLPIDEM 5 MG TAB: 5 mg | ORAL | Qty: 1

## 2014-10-19 ENCOUNTER — Encounter: Attending: Advanced Practice Midwife

## 2014-10-25 ENCOUNTER — Encounter: Attending: Obstetrics & Gynecology

## 2014-10-25 ENCOUNTER — Inpatient Hospital Stay
Admit: 2014-10-25 | Discharge: 2014-10-27 | Disposition: A | Discharge status: Home / Self Care | Payer: MEDICAID | Attending: Obstetrics & Gynecology | Admitting: Obstetrics & Gynecology

## 2014-10-25 DIAGNOSIS — O99284 Endocrine, nutritional and metabolic diseases complicating childbirth: Secondary | ICD-10-CM

## 2014-10-25 LAB — CBC WITH AUTOMATED DIFF
ABS. BASOPHILS: 0 10*3/uL (ref 0.0–0.1)
ABS. EOSINOPHILS: 0 10*3/uL (ref 0.0–0.4)
ABS. LYMPHOCYTES: 2.2 10*3/uL (ref 0.9–3.6)
ABS. MONOCYTES: 0.8 10*3/uL (ref 0.05–1.2)
ABS. NEUTROPHILS: 5.1 10*3/uL (ref 1.8–8.0)
BASOPHILS: 0 % (ref 0–2)
EOSINOPHILS: 0 % (ref 0–5)
HCT: 29.3 % — ABNORMAL LOW (ref 35.0–45.0)
HGB: 10 g/dL — ABNORMAL LOW (ref 12.0–16.0)
LYMPHOCYTES: 28 % (ref 21–52)
MCH: 23.5 PG — ABNORMAL LOW (ref 24.0–34.0)
MCHC: 34.1 g/dL (ref 31.0–37.0)
MCV: 68.8 FL — ABNORMAL LOW (ref 74.0–97.0)
MONOCYTES: 9 % (ref 3–10)
MPV: 8.6 FL — ABNORMAL LOW (ref 9.2–11.8)
NEUTROPHILS: 63 % (ref 40–73)
PLATELET: 197 10*3/uL (ref 135–420)
RBC: 4.26 M/uL (ref 4.20–5.30)
RDW: 17.3 % — ABNORMAL HIGH (ref 11.6–14.5)
WBC: 8.1 10*3/uL (ref 4.6–13.2)

## 2014-10-25 LAB — T4, FREE: T4, Free: 1 NG/DL (ref 0.7–1.5)

## 2014-10-25 LAB — TYPE & SCREEN
ABO/Rh(D): O POS
Antibody screen: NEGATIVE

## 2014-10-25 LAB — HIV 1/2 AB SCREEN W RFLX CONFIRM
HIV 1/2 Interpretation: NONREACTIVE
HIV1/2 INTERPRETATION, HHIVI: NONREACTIVE

## 2014-10-25 LAB — TSH 3RD GENERATION: TSH: 0.62 u[IU]/mL (ref 0.36–3.74)

## 2014-10-25 LAB — TYPE AND SCREEN
ABO/Rh: O POS
Antibody Screen: NEGATIVE

## 2014-10-25 MED ORDER — LIDOCAINE (PF) 10 MG/ML (1 %) IJ SOLN
10 mg/mL (1 %) | INTRAMUSCULAR | Status: DC | PRN
Start: 2014-10-25 — End: 2014-10-25

## 2014-10-25 MED ORDER — FENTANYL CITRATE (PF) 50 MCG/ML IJ SOLN
50 mcg/mL | Freq: Once | INTRAMUSCULAR | Status: AC
Start: 2014-10-25 — End: 2014-10-25

## 2014-10-25 MED ORDER — LACTATED RINGERS BOLUS IV
INTRAVENOUS | Status: DC | PRN
Start: 2014-10-25 — End: 2014-10-25
  Administered 2014-10-25: 13:00:00 via INTRAVENOUS

## 2014-10-25 MED ORDER — FENTANYL-ROPIVACAINE IN NS (PF) 1.5 MCG/ML-0.2% EPIDURAL
EPIDURAL | Status: DC
Start: 2014-10-25 — End: 2014-10-25

## 2014-10-25 MED ORDER — LACTATED RINGERS BOLUS IV
Freq: Once | INTRAVENOUS | Status: DC
Start: 2014-10-25 — End: 2014-10-25

## 2014-10-25 MED ORDER — NALBUPHINE 10 MG/ML INJECTION
10 mg/mL | Freq: Three times a day (TID) | INTRAMUSCULAR | Status: DC | PRN
Start: 2014-10-25 — End: 2014-10-25

## 2014-10-25 MED ORDER — OXYTOCIN 20 UNITS/1000 ML IN LACTATED RINGERS IV
20 unit/1,000 mL | INTRAVENOUS | Status: DC
Start: 2014-10-25 — End: 2014-10-25

## 2014-10-25 MED ORDER — NALOXONE 0.4 MG/ML INJECTION
0.4 mg/mL | INTRAMUSCULAR | Status: DC | PRN
Start: 2014-10-25 — End: 2014-10-25

## 2014-10-25 MED ORDER — BUTORPHANOL TARTRATE 1 MG/ML INJECTION
1 mg/mL | INTRAMUSCULAR | Status: DC | PRN
Start: 2014-10-25 — End: 2014-10-25

## 2014-10-25 MED ORDER — ROPIVACAINE 2 MG/ML INJECTION
2 mg/mL (0. %) | INTRAMUSCULAR | Status: AC
Start: 2014-10-25 — End: 2014-10-25

## 2014-10-25 MED ORDER — ROPIVACAINE 2 MG/ML INJECTION
2 mg/mL (0. %) | INTRAMUSCULAR | Status: AC
Start: 2014-10-25 — End: 2014-10-25
  Administered 2014-10-25: 16:00:00 via EPIDURAL

## 2014-10-25 MED ORDER — FENTANYL-ROPIVACAINE IN NS (PF) 1.5 MCG/ML-0.2% EPIDURAL
EPIDURAL | Status: AC
Start: 2014-10-25 — End: 2014-10-25
  Administered 2014-10-25: 16:00:00 via EPIDURAL

## 2014-10-25 MED ORDER — DIPHENHYDRAMINE HCL 50 MG/ML IJ SOLN
50 mg/mL | INTRAMUSCULAR | Status: DC | PRN
Start: 2014-10-25 — End: 2014-10-25

## 2014-10-25 MED ORDER — EPHEDRINE SULFATE 50 MG/ML IJ SOLN
50 mg/mL | INTRAMUSCULAR | Status: DC | PRN
Start: 2014-10-25 — End: 2014-10-25

## 2014-10-25 MED ORDER — LACTATED RINGERS IV
INTRAVENOUS | Status: DC
Start: 2014-10-25 — End: 2014-10-25
  Administered 2014-10-25 (×2): via INTRAVENOUS

## 2014-10-25 MED ORDER — TERBUTALINE 1 MG/ML SUB-Q
1 mg/mL | SUBCUTANEOUS | Status: DC | PRN
Start: 2014-10-25 — End: 2014-10-25

## 2014-10-25 MED ORDER — MINERAL OIL ORAL
ORAL | Status: DC | PRN
Start: 2014-10-25 — End: 2014-10-25

## 2014-10-25 MED ORDER — SODIUM CHLORIDE 0.9 % IJ SYRG
Freq: Three times a day (TID) | INTRAMUSCULAR | Status: DC
Start: 2014-10-25 — End: 2014-10-25

## 2014-10-25 MED ORDER — METHYLERGONOVINE MALEATE 0.2 MG/ML IJ SOLN
0.2 mg/mL (1 mL) | INTRAMUSCULAR | Status: DC | PRN
Start: 2014-10-25 — End: 2014-10-25

## 2014-10-25 MED ORDER — LEVOTHYROXINE 25 MCG TAB
25 mcg | Freq: Every day | ORAL | Status: DC
Start: 2014-10-25 — End: 2014-10-27
  Administered 2014-10-26 – 2014-10-27 (×2): via ORAL

## 2014-10-25 MED ORDER — ACETAMINOPHEN 325 MG TABLET
325 mg | ORAL | Status: AC
Start: 2014-10-25 — End: 2014-10-25
  Administered 2014-10-25: 17:00:00 via ORAL

## 2014-10-25 MED ORDER — OXYTOCIN 20 UNITS/1000 ML IN LACTATED RINGERS IV
20 unit/1,000 mL | Freq: Once | INTRAVENOUS | Status: AC
Start: 2014-10-25 — End: 2014-10-25

## 2014-10-25 MED ORDER — ONDANSETRON (PF) 4 MG/2 ML INJECTION
4 mg/2 mL | Freq: Four times a day (QID) | INTRAMUSCULAR | Status: DC | PRN
Start: 2014-10-25 — End: 2014-10-25
  Administered 2014-10-25: 19:00:00 via INTRAVENOUS

## 2014-10-25 MED ORDER — SODIUM CHLORIDE 0.9 % IJ SYRG
INTRAMUSCULAR | Status: DC | PRN
Start: 2014-10-25 — End: 2014-10-25

## 2014-10-25 MED ORDER — FENTANYL CITRATE (PF) 50 MCG/ML IJ SOLN
50 mcg/mL | INTRAMUSCULAR | Status: AC
Start: 2014-10-25 — End: 2014-10-25
  Administered 2014-10-25: 16:00:00 via EPIDURAL

## 2014-10-25 MED ORDER — OXYTOCIN 20 UNITS/1000 ML IN LACTATED RINGERS IV
20 unit/1,000 mL | INTRAVENOUS | Status: DC
Start: 2014-10-25 — End: 2014-10-27
  Administered 2014-10-25 (×7): via INTRAVENOUS

## 2014-10-25 MED ORDER — LACTATED RINGERS BOLUS IV
INTRAVENOUS | Status: DC | PRN
Start: 2014-10-25 — End: 2014-10-25

## 2014-10-25 MED FILL — BD POSIFLUSH NORMAL SALINE 0.9 % INJECTION SYRINGE: INTRAMUSCULAR | Qty: 10

## 2014-10-25 MED FILL — LACTATED RINGERS IV: INTRAVENOUS | Qty: 1000

## 2014-10-25 MED FILL — TYLENOL 325 MG TABLET: 325 mg | ORAL | Qty: 2

## 2014-10-25 MED FILL — FENTANYL-ROPIVACAINE IN NS (PF) 1.5 MCG/ML-0.2% EPIDURAL: EPIDURAL | Qty: 100

## 2014-10-25 MED FILL — ONDANSETRON (PF) 4 MG/2 ML INJECTION: 4 mg/2 mL | INTRAMUSCULAR | Qty: 2

## 2014-10-25 MED FILL — FENTANYL CITRATE (PF) 50 MCG/ML IJ SOLN: 50 mcg/mL | INTRAMUSCULAR | Qty: 2

## 2014-10-25 MED FILL — OXYTOCIN 20 UNITS/1000 ML IN LACTATED RINGERS IV: 20 unit/1,000 mL | INTRAVENOUS | Qty: 1000

## 2014-10-25 MED FILL — ROPIVACAINE 2 MG/ML INJECTION: 2 mg/mL (0. %) | INTRAMUSCULAR | Qty: 20

## 2014-10-25 NOTE — Other (Addendum)
TRANSFER - IN REPORT:    Verbal report received from E Bosch RN(name) on Linda [name]  being received from L+D(unit) for routine progression of care      Report consisted of patient???s Situation, Background, Assessment and   Recommendations(SBAR).     Information from the following report(s) SBAR, Kardex and MAR was reviewed with the receiving nurse.    Opportunity for questions and clarification was provided.      Assessment completed upon patient???s arrival to unit and care assumed.     Recieived Pt in stable condition following recovery from vag del of VMI. Postpartum teaching initiated and instructed to call for BR assistance. PO flds given.    2035- Dr. Mariah MillingMorales notified of Pt request for stronger pain meds. Orders received.

## 2014-10-25 NOTE — Progress Notes (Signed)
TRANSFER - OUT REPORT:    Verbal report given to A. Custis, RN (name) on Linda [name]  being transferred to [facility](unit) for routine progression of care       Report consisted of patient???s Situation, Background, Assessment and   Recommendations(SBAR).     Information from the following report(s) SBAR, Kardex, Procedure Summary, Intake/Output and MAR was reviewed with the receiving nurse.    Lines:   Peripheral IV 10/25/14 Right Hand (Active)        Opportunity for questions and clarification was provided.      Patient transported with:   Registered Nurse  Tech

## 2014-10-25 NOTE — Anesthesia Pre-Procedure Evaluation (Signed)
Anesthetic History   No history of anesthetic complications            Review of Systems / Medical History  Patient summary reviewed and pertinent labs reviewed    Pulmonary                   Neuro/Psych   Within defined limits           Cardiovascular                  Exercise tolerance: <4 METS: 9 months pregnant     GI/Hepatic/Renal                Endo/Other    Diabetes  Hypothyroidism  Morbid obesity    Comments: Gestational diabetes Other Findings              Physical Exam    Airway  Mallampati: II  TM Distance: 4 - 6 cm  Neck ROM: normal range of motion        Cardiovascular    Rhythm: regular  Rate: normal         Dental  No notable dental hx       Pulmonary  Breath sounds clear to auscultation               Abdominal  GI exam deferred       Other Findings            Anesthetic Plan    ASA: 3, emergent  Anesthesia type: epidural            Anesthetic plan and risks discussed with: Patient

## 2014-10-25 NOTE — Progress Notes (Addendum)
Patient requesting epidural     11:34- Anesthesia paged for epidural     11:41- Anesthesia at bedside for epidural.     11:44- Timeout performed.    11:47- Epidural began     12:08- Epidural complete.

## 2014-10-25 NOTE — Progress Notes (Addendum)
Verbal bedside report done with Johnn HaiEmily Plemmons RN [facility]    0720 Assumed care of patient coming out of the bathroom c/o pain 6/10  requseting pain medication prefers pain meds in her IV verses epidural. Plan of care discussed with patient , patient agrees with plan of care TOCO and EFM monitor applied. Patient denies bleeding and leaking of fluids positive fetal movement noted per nurse. Patient contracting abdomen palpates semisoft between contractions.    0725 Patient states she does not want pain meds at this time    0745 Call placed to Dr Mariah MillingMorales informing of  Patients arrival and treatment protocol new orders received  0950 Dr Mariah MillingMorales @ bedside [facility] patient tolerated well  1200 Patient requesting epidural  Anesthesia paged    1214 Anesthesia @ bedside for consent of epidural    1456 Dr Mariah MillingMorales @ bedside for placement of IUPC    1555 Dr Mariah MillingMorales @ bedside for delivery    1601 Birth of a viable female infant apgars 599,9    [id number] Birth of a viable female infant apgars 789,9

## 2014-10-25 NOTE — Other (Signed)
7425: Rcd G4P2 [redacted]w[redacted]d pt to room 2225 for induction of labor at term. EFM and TOCO applied, abd palpates soft. Pt denies vag bleeding, discharge or LOF. Reports pos fetal movement and some occ contractions. Pregnancy complicated with positive G/C in may, and hypothyroidism which is being treated with synthroid. Pt reports 1 hour glucose attempt 3 times. Reports she threw up each time. States one of the results was elevated and was told she has gestational diabetes. Pt was supposed to follow up with MFM but could ot due to issues with transportation.

## 2014-10-25 NOTE — Progress Notes (Signed)
Labor Progress Note  Patient seen, fetal heart rate and contraction pattern evaluated, patient examined.  Patient Vitals for the past 8 hrs:   BP Temp Pulse Resp   10/25/14 0846 120/50 mmHg - 71 18   10/25/14 0832 125/49 mmHg - 75 18   10/25/14 0718 145/74 mmHg 97.9 ??F (36.6 ??C) 79 18   10/25/14 0703 135/72 mmHg 97.9 ??F (36.6 ??C) 91 18       Physical Exam:  Cervical Exam:  5 cm dilated    80% effaced    -2 station    Presenting Part: cephalic  Cervical Position: mid position  Consistency: Soft  Uterine Activity: not picking up  Fetal Heart Rate: Baseline: 135 per minute  Variability: moderate  Accelerations: yes  Decelerations: early  Uterine contractions: not picking up, IUPC placed    Assessment/Plan:  Reassuring fetal status, Labor  Continue expectant management, Continue plan for vaginal delivery  Titrate pit up to adequate  Signed By:  Etta Grandchild, MD     October 25, 2014 3:02 PM

## 2014-10-25 NOTE — Other (Signed)
Care of pt assumed.  Pt resting in bed with c/o headache 7/10.  Pt requesting tylenol.  Fundus firm at U with scant bleeding noted.    1955:  Pt up to void.  Peri-care provided.

## 2014-10-25 NOTE — Anesthesia Post-Procedure Evaluation (Signed)
Post-Anesthesia Evaluation and Assessment    Patient: Linda [name] MRN: [id number]  SSN: [id number]    Date of Birth: 1991-01-08  Age: 24 y.o.  Sex: female       Cardiovascular Function/Vital Signs  Visit Vitals   Item Reading   ??? BP 100/56 mmHg   ??? Pulse 76   ??? Temp 37.1 ??C (98.7 ??F)   ??? Resp 18   ??? Ht 5\' 4"  (1.626 m)   ??? Wt 127.461 kg (281 lb)   ??? BMI 48.21 kg/m2   ??? Breastfeeding No       Patient is status post epidural anesthesia for * No procedures listed *.    Nausea/Vomiting: None    Postoperative hydration reviewed and adequate.    Pain:  Pain Scale 1: Labor Algorithm/Pain Intensity (10/25/14 0659)   Managed    Neurological Status:   Neuro (WDL): Within Defined Limits (10/25/14 1810)   At baseline    Mental Status and Level of Consciousness: Alert and oriented     Pulmonary Status:   O2 Device: Room air (10/25/14 1755)   Adequate oxygenation and airway patent    Complications related to anesthesia: None    Post-anesthesia assessment completed. No concerns    Signed By: Miquel DunnODERICK L Miria Cappelli, CRNA     October 25, 2014

## 2014-10-25 NOTE — Anesthesia Procedure Notes (Signed)
Epidural Block    Start time: 10/25/2014 12:00 PM  End time: 10/25/2014 12:15 PM  Reason for block: at surgeon's request and labor epidural  Staffing  Anesthesiologist: Eileen Stanford C  Performed by: anesthesiologist   Prep  Risks and benefits discussed with the patient and plans are to proceed   Site marked, Timeout performed  Patient was placed in seated position  The back was prepped at the lumbar region  Prep Solution(s): chlorhexidine  Epidural  Epidural Location: L3-4  Needle: 18G Tuohy  Attempts: needle passed 2 time(s) with loss of resistance using air  Catheter: 20 G epidural catheter placed/secured  Catheter at skin depth: approximately 12cm  Catheter into epidural space: approximately 6cm  No blood with aspiration, no cerebrospinal fluid with aspiration, no paresthesia and negative aspiration test  Test dose: lidocaine 1.5% w/ epi, negative  Assessment  Catheter was secured to back with tegaderm  Insertion was uncomplicated and patient tolerated without any apparent complications  Additional Notes  Labor Epidural Note    Patient: Linda [name] MRN: [id number]  SSN: [id number]   Date of Birth: 09-Mar-1991  Age: 24 y.o.  Sex: female      Cardiovascular Function/Vital Signs  LMP 01/16/2014 (Exact Date)    Referring physician:    Epidural Catheter Placement  Operator: Pennie Rushing, MD    G 4P2 in labor:  2   cm dilated.    Risks, benefits, and alternatives discussed. Consent obtained.Time out performed, Correct patient and site identified.  Patient placed in sitting position, back prepped and draped with Chlorhexidine x3.  Local-1% lidocaine to skin and subcutaneous.  18 Tuohy needle placed at  Level L3 - L4 to epidural space by + LOR obtained with air _0  cm, no heme or CSF noted. Epidural catheter placed via Tuohy needle and secured at 12 cm at skin, no paresthesias,heme or CSF noted.  Negative test dose with 3 mLs 1.5% lidocaine w/1:200,000 epinephrine.   Fentanyl 100 mcg via epidural catheter.  Ropivacaine 0.2 % bolus of 9 mL given in 3 mL increments over 10 minutes.  Patient tolerated the procedure well, VSS throughout, no apparent complications.     Empty flowsheet group.      For additional vitals, please see nurses notes.     Epidural kit: Lot# [id number]                Expiration Date 2017-10  Anesthesia Start time: 1200    10/25/2014, 12:16 PM  Pennie Rushing, MD

## 2014-10-25 NOTE — H&P (Signed)
History & Physical    Name: Linda [name] MRN: [id number]  SSN: [id number]    Date of Birth: 03-30-1990  Age: 24 y.o.  Sex: female      Subjective:     Estimated Date of Delivery: 10/24/14  OB History     Gravida Para Term Preterm AB TAB SAB Ectopic Multiple Living    4 2 2  1  1   2           Linda [name] is admitted with pregnancy at [redacted]w[redacted]d for induction of labor due to favorable cervix at term. Prenatal course was complicated by gonorrhea, chlamydia, limited prenatal care, hypothyroid--> on synthroid 75 mcg, rx'ed by Dr. Samuel Bouche, no 1 hour GTT because pt declined. TOC for GC/Chl neg. Her GBS status is unknown.  Please see prenatal records for details.    Past Medical History   Diagnosis Date   ??? Morbid obesity (HCC)    ??? Hypothyroidism    ??? Psychiatric problem      ADHD   ??? Anemia    ??? Gestational diabetes--> did not do 1 hour, states that she was told that she has it      Past Surgical History   Procedure Laterality Date   ??? Hx dilation and curettage  09/2013     Allergies   Allergen Reactions   ??? Percocet [Oxycodone-Acetaminophen] Nausea and Vomiting     Prior to Admission medications    Medication Sig Start Date End Date Taking? Authorizing Provider   levothyroxine (SYNTHROID) 75 mcg tablet Take 1 Tab by mouth Daily (before breakfast). Indications: HYPOTHYROIDISM 07/29/14  Yes Aundra Dubin., MD   prenatal multivit-ca-min-fe-fa (PRENATAL VITAMIN) tab Take 1 Tab by mouth daily. 07/05/14  Yes Emerson Monte, CNM      History     Social History   ??? Marital Status: SINGLE     Spouse Name: N/A   ??? Number of Children: N/A   ??? Years of Education: N/A     Occupational History   ??? Not on file.     Social History Main Topics   ??? Smoking status: Never Smoker    ??? Smokeless tobacco: Not on file   ??? Alcohol Use: No   ??? Drug Use: No   ??? Sexual Activity:     Partners: Male     Pharmacist, hospital Protection: None     Other Topics Concern   ??? Not on file     Social History Narrative     Family History    Problem Relation Age of Onset   ??? Hypertension Maternal Grandmother    ??? Hypertension Maternal Grandfather    ??? Hypertension Paternal Grandfather    ??? Hypertension Mother    ??? Asthma Mother    ??? No Known Problems Brother    ??? No Known Problems Sister            Review of Systems: Pertinent items are noted in the History of Present Illness.    Objective:     Vitals:  Filed Vitals:    10/25/14 0703 10/25/14 0718 10/25/14 0832 10/25/14 0846   BP: 135/72 145/74 125/49 120/50   Pulse: 91 79 75 71   Temp: 97.9 ??F (36.6 ??C) 97.9 ??F (36.6 ??C)     Resp: 18 18 18 18    Height:       Weight:            Physical Exam:  Patient without distress.  Heart: Regular rate and rhythm  Lung: clear to auscultation throughout lung fields, no wheezes, no rales, no rhonchi and normal respiratory effort  Abdomen: soft, nontender  Fundus: soft and non tender  Perineum: blood absent, amniotic fluid absent  Cervical Exam: 3 cm dilated    50% effaced    -2 station    Presenting Part: cephalic  Cervical Position: posterior  Consistency: Soft  Bishop Score: 6  Lower Extremities:  - Edema No  Membranes:  Artificial Rupture of Membranes; Amniotic Fluid: small amount of clear fluid  Fetal Heart Rate: Baseline: 135 per minute  Variability: moderate  Accelerations: yes  Decelerations: none  Uterine contractions: none            Prenatal Labs:   Lab Results   Component Value Date/Time    RUBELLA, EXTERNAL Immune 06/02/2014    GRBSTREP, EXTERNAL positive 08/27/2011    HBSAG, EXTERNAL Negative 06/02/2014    HIV, EXTERNAL negative 08/03/2012    RPR, EXTERNAL NR 06/02/2014    GONORRHEA, EXTERNAL negative 08/10/2014    CHLAMYDIA, EXTERNAL negative 08/10/2014         Impression/Plan:     Plan: Admit for induction of labor. Group B Strep not tested. Will test today as well as GC/Chl/TV, will also check TSH.    R/B/A of induction with vaginal delivery of C/S discussed infection, bleeding, blood transfusion, injury to internal organs, baby, life saving  hysterectomy, future rupture with VBAC if C/S, all of her questions answered.    Signed By:  Etta Grandchild, MD     October 25, 2014 9:40 AM

## 2014-10-25 NOTE — Anesthesia Procedure Notes (Signed)
Epidural Block    Start time: 10/25/2014 12:00 PM  End time: 10/25/2014 12:15 PM  Reason for block: at surgeon's request and labor epidural  Staffing  Anesthesiologist: Eileen Stanford C  Performed by: anesthesiologist   Prep  Risks and benefits discussed with the patient and plans are to proceed   Site marked, Timeout performed  Patient was placed in seated position  The back was prepped at the lumbar region  Prep Solution(s): chlorhexidine  Epidural  Epidural Location: L3-4  Needle: 18G Tuohy  Attempts: needle passed 2 time(s) with loss of resistance using air  Catheter: 20 G epidural catheter placed/secured  Catheter at skin depth: approximately 12cm  Catheter into epidural space: approximately 6cm  No blood with aspiration, no cerebrospinal fluid with aspiration, no paresthesia and negative aspiration test  Test dose: lidocaine 1.5% w/ epi, negative  Assessment  Catheter was secured to back with tegaderm  Insertion was uncomplicated and patient tolerated without any apparent complications  Additional Notes  Labor Epidural Note    Patient: Linda [name] MRN: [id number]  SSN: [id number]   Date of Birth: [birth year]  Age: 24 y.o.  Sex: female      Cardiovascular Function/Vital Signs  LMP 01/16/2014 (Exact Date)    Referring physician:    Epidural Catheter Placement  Operator: Pennie Rushing, MD    G 4P2 in labor:  2   cm dilated.    Risks, benefits, and alternatives discussed. Consent obtained.Time out performed, Correct patient and site identified.  Patient placed in sitting position, back prepped and draped with Chlorhexidine x3.  Local-1% lidocaine to skin and subcutaneous.  18 Tuohy needle placed at  Level L3 - L4 to epidural space by + LOR obtained with air @6  cm, no heme or CSF noted. Epidural catheter placed via Tuohy needle and secured at 12 cm at skin, no paresthesias,heme or CSF noted.  Negative test dose with 3 mLs 1.5% lidocaine w/1:200,000 epinephrine.  Fentanyl 100 mcg via epidural catheter.   Ropivacaine 0.2 % bolus of 9 mL given in 3 mL increments over 10 minutes.  Patient tolerated the procedure well, VSS throughout, no apparent complications.     Empty flowsheet group.      For additional vitals, please see nurses notes.     Epidural kit: Lot# [id number]                Expiration Date 2017-10  Anesthesia Start time: 1200    10/25/2014, 12:16 PM  Pennie Rushing, MD

## 2014-10-26 LAB — HEMATOCRIT: HCT: 26.8 % — ABNORMAL LOW (ref 35.0–45.0)

## 2014-10-26 LAB — HEMOGLOBIN: HGB: 9 g/dL — ABNORMAL LOW (ref 12.0–16.0)

## 2014-10-26 MED ORDER — MODIFIED LANOLIN CREAM
CUTANEOUS | Status: DC | PRN
Start: 2014-10-26 — End: 2014-10-27

## 2014-10-26 MED ORDER — SENNOSIDES-DOCUSATE SODIUM 8.6 MG-50 MG TAB
Freq: Two times a day (BID) | ORAL | Status: DC | PRN
Start: 2014-10-26 — End: 2014-10-27

## 2014-10-26 MED ORDER — DOCUSATE SODIUM 100 MG CAP
100 mg | Freq: Every day | ORAL | Status: DC
Start: 2014-10-26 — End: 2014-10-27
  Administered 2014-10-26 – 2014-10-27 (×2): via ORAL

## 2014-10-26 MED ORDER — RHO D IMMUNE GLOBULIN 300 MCG IM SYRG
1500 unit (300 mcg) | INTRAMUSCULAR | Status: DC | PRN
Start: 2014-10-26 — End: 2014-10-27

## 2014-10-26 MED ORDER — PROMETHAZINE 25 MG/ML INJECTION
25 mg/mL | INTRAMUSCULAR | Status: DC | PRN
Start: 2014-10-26 — End: 2014-10-27

## 2014-10-26 MED ORDER — FERROUS SULFATE 325 MG (65 MG ELEMENTAL IRON) TAB
325 mg (65 mg iron) | Freq: Two times a day (BID) | ORAL | Status: DC
Start: 2014-10-26 — End: 2014-10-27
  Administered 2014-10-26 – 2014-10-27 (×3): via ORAL

## 2014-10-26 MED ORDER — ZOLPIDEM 5 MG TAB
5 mg | Freq: Every evening | ORAL | Status: DC | PRN
Start: 2014-10-26 — End: 2014-10-27

## 2014-10-26 MED ORDER — IBUPROFEN 400 MG TAB
400 mg | Freq: Three times a day (TID) | ORAL | Status: DC | PRN
Start: 2014-10-26 — End: 2014-10-26

## 2014-10-26 MED ORDER — HYDROCODONE-ACETAMINOPHEN 5 MG-325 MG TAB
5-325 mg | ORAL | Status: DC | PRN
Start: 2014-10-26 — End: 2014-10-27
  Administered 2014-10-26 – 2014-10-27 (×5): via ORAL

## 2014-10-26 MED ORDER — ACETAMINOPHEN 325 MG TABLET
325 mg | ORAL | Status: DC | PRN
Start: 2014-10-26 — End: 2014-10-27
  Administered 2014-10-26: via ORAL

## 2014-10-26 MED ORDER — IBUPROFEN 400 MG TAB
400 mg | Freq: Three times a day (TID) | ORAL | Status: DC | PRN
Start: 2014-10-26 — End: 2014-10-27
  Administered 2014-10-26 – 2014-10-27 (×4): via ORAL

## 2014-10-26 MED ORDER — HYDROCODONE-ACETAMINOPHEN 5 MG-325 MG TAB
5-325 mg | Freq: Four times a day (QID) | ORAL | Status: DC | PRN
Start: 2014-10-26 — End: 2014-10-26
  Administered 2014-10-26 (×2): via ORAL

## 2014-10-26 MED ORDER — HYDROCODONE-ACETAMINOPHEN 5 MG-325 MG TAB
5-325 mg | ORAL | Status: DC | PRN
Start: 2014-10-26 — End: 2014-10-26

## 2014-10-26 MED ORDER — MEASLES,MUMPS&RUBELLA VACCIN(PF) 1,000-12,500 TCID50/0.5 ML SUB-Q SUSP
1000-12500 TCID50/0.5 mL | SUBCUTANEOUS | Status: DC | PRN
Start: 2014-10-26 — End: 2014-10-27

## 2014-10-26 MED ORDER — BENZOCAINE-MENTHOL 20 %-0.5 % TOPICAL AEROSOL
CUTANEOUS | Status: DC | PRN
Start: 2014-10-26 — End: 2014-10-27

## 2014-10-26 MED FILL — HYDROCODONE-ACETAMINOPHEN 5 MG-325 MG TAB: 5-325 mg | ORAL | Qty: 2

## 2014-10-26 MED FILL — IBUPROFEN 400 MG TAB: 400 mg | ORAL | Qty: 2

## 2014-10-26 MED FILL — DERMOPLAST (WITH MENTHOL) 20 %-0.5 % TOPICAL AEROSOL: CUTANEOUS | Qty: 56

## 2014-10-26 MED FILL — MODIFIED LANOLIN CREAM: CUTANEOUS | Qty: 7

## 2014-10-26 MED FILL — DOCUSATE SODIUM 100 MG CAP: 100 mg | ORAL | Qty: 1

## 2014-10-26 MED FILL — FERROUS SULFATE 325 MG (65 MG ELEMENTAL IRON) TAB: 325 mg (65 mg iron) | ORAL | Qty: 1

## 2014-10-26 MED FILL — TYLENOL 325 MG TABLET: 325 mg | ORAL | Qty: 2

## 2014-10-26 MED FILL — SYNTHROID 25 MCG TABLET: 25 mcg | ORAL | Qty: 3

## 2014-10-26 NOTE — Progress Notes (Signed)
Asked to evaluate pt with Headache s/p Epidural for SVD    Pt with mild frontal HA.  No neck stiffness/rigidity.  No change in symptoms   With position.  Also c/o of back pain. No visual changes.        Based on her symptoms and no documented dural puncture,   I doubt that this patient's headache is due to dural puncture.      I would continue her current pain regiment and add PO narcotics as needed.

## 2014-10-26 NOTE — Other (Signed)
0720  VERBAL Bedside shift change report given to  Andrey CampanileWilson rnc  by [employer] RN.   Report given with SBAR, Kardex, MAR and Recent Results.   0800 Assumed care. Nursing assessment completed.  1500  VS taken.  1830 Resting in bed.

## 2014-10-26 NOTE — Other (Signed)
1933 Bedside and Verbal shift change report given to Jarrett AblesKrista Pembroke, RN   (oncoming nurse) by Azell Der Wilson RN (offgoing nurse). Report included the following information SBAR and Kardex.

## 2014-10-26 NOTE — Progress Notes (Signed)
RETURN OB VISIT SUMMARY    Subjective:     Pt denies complaints.     Objective:   Physical Exam:  Patient Vitals for the past 8 hrs:   BP Temp Pulse Resp SpO2   10/26/14 0726 113/65 mmHg 97.6 ??F (36.4 ??C) 72 20 99 %   10/26/14 0200 130/67 mmHg 97 ??F (36.1 ??C) 77 20 99 %       Breast:  Non-Engorged  Lungs:clear  Uterine Fundus:firm  Abdomen:bowel sounds present and normal in all 4 quadrants, soft, round or nontender  Lochia: appropriate     No evidence of DVT seen on physical exam.     Lab/Data Review:  CBC:   Lab Results   Component Value Date/Time    HGB 9.0* 10/26/2014 06:35 AM    HCT 26.8* 10/26/2014 06:35 AM       Assessment/Plan:     S/P NSVD-stable  Continue present plan          Aundra Dubin, MD  October 26, 2014

## 2014-10-27 LAB — CHLAMYDIA/NEISSERIA AMPLIFICATION
Chlamydia amplification: NEGATIVE
N. gonorrhoeae amplification: NEGATIVE

## 2014-10-27 MED ORDER — HYDROCODONE-ACETAMINOPHEN 5 MG-325 MG TAB
5-325 mg | ORAL_TABLET | ORAL | 0 refills | Status: DC | PRN
Start: 2014-10-27 — End: 2014-12-10

## 2014-10-27 MED ORDER — DOCUSATE SODIUM 100 MG CAP
100 mg | ORAL_CAPSULE | Freq: Every day | ORAL | 2 refills | Status: DC
Start: 2014-10-27 — End: 2014-12-10

## 2014-10-27 MED ORDER — FERROUS SULFATE 325 MG (65 MG ELEMENTAL IRON) TAB
325 mg (65 mg iron) | ORAL_TABLET | Freq: Three times a day (TID) | ORAL | 3 refills | Status: DC
Start: 2014-10-27 — End: 2017-08-06

## 2014-10-27 MED ORDER — IBUPROFEN 800 MG TAB
800 mg | ORAL_TABLET | Freq: Three times a day (TID) | ORAL | 2 refills | Status: DC | PRN
Start: 2014-10-27 — End: 2017-08-06

## 2014-10-27 MED FILL — IBUPROFEN 400 MG TAB: 400 mg | ORAL | Qty: 2

## 2014-10-27 MED FILL — DOCUSATE SODIUM 100 MG CAP: 100 mg | ORAL | Qty: 1

## 2014-10-27 MED FILL — HYDROCODONE-ACETAMINOPHEN 5 MG-325 MG TAB: 5-325 mg | ORAL | Qty: 2

## 2014-10-27 MED FILL — FERROUS SULFATE 325 MG (65 MG ELEMENTAL IRON) TAB: 325 mg (65 mg iron) | ORAL | Qty: 1

## 2014-10-27 MED FILL — SYNTHROID 25 MCG TABLET: 25 mcg | ORAL | Qty: 3

## 2014-10-27 NOTE — Other (Addendum)
8:24 am Pt to be d/c home today with baby.   12:00 I have reviewed discharge instructions with the patient.  The patient verbalized understanding.

## 2014-10-27 NOTE — Discharge Summary (Signed)
Obstetrical Discharge Summary     Name: Linda [name] MRN: [id number]  SSN: [id number]    Date of Birth: Jul 22, 1990  Age: 24 y.o.  Sex: female      Admit Date: 10/25/2014    Discharge Date: 10/27/2014     Admitting Physician: Etta Grandchildenee C Morales, MD     Attending Physician:  Etta Grandchildenee C Morales, MD     * Admission Diagnoses: OB INDUCTION DELIVERY;Pregnancy    * Discharge Diagnoses:   Information for the patient's newborn:  Ervin KnackMcLaurin, Baby Boy A [[id number]][[id number]]   Delivery of a 6 lb 13 oz (3.09 kg) female infant via Spontaneous Vaginal Delivery  on 10/25/2014 at 4:01 PM  by . Apgars were 9 and 9.       Additional Diagnoses:   Hospital Problems as of 10/27/2014  Date Reviewed: 10/27/2014          Codes Class Noted - Resolved POA    Pregnancy ICD-10-CM: Z33.1  ICD-9-CM: V22.2  03/12/2013 - Present Unknown             Lab Results   Component Value Date/Time    ABO/RH(D) O POSITIVE 10/25/2014 06:30 AM    RUBELLA, EXTERNAL Immune 06/02/2014    GRBSTREP, EXTERNAL positive 08/27/2011    ABO,RH O POSITIVE 06/02/2014      Immunization History   Administered Date(s) Administered   ??? Tdap 08/30/2011       * Procedures: NSVD.           * Discharge Condition: good    [facility] Hospital Course: Normal hospital course following the delivery.    * Disposition: Home    Discharge Medications:   Current Discharge Medication List      START taking these medications    Details   docusate sodium (COLACE) 100 mg capsule Take 1 Cap by mouth daily for 90 days. Indications: CONSTIPATION  Qty: 30 Cap, Refills: 2      ferrous sulfate 325 mg (65 mg iron) tablet Take 1 Tab by mouth three (3) times daily (with meals). Indications: IRON DEFICIENCY ANEMIA  Qty: 90 Tab, Refills: 3      HYDROcodone-acetaminophen (NORCO) 5-325 mg per tablet Take 2 Tabs by mouth every four (4) hours as needed. Max Daily Amount: 12 Tabs. Indications: PAIN  Qty: 40 Tab, Refills: 0      ibuprofen (MOTRIN) 800 mg tablet Take 1 Tab by mouth every eight (8) hours as needed. Indications: PAIN   Qty: 40 Tab, Refills: 2         CONTINUE these medications which have NOT CHANGED    Details   levothyroxine (SYNTHROID) 75 mcg tablet Take 1 Tab by mouth Daily (before breakfast). Indications: HYPOTHYROIDISM  Qty: 30 Tab, Refills: 2      prenatal multivit-ca-min-fe-fa (PRENATAL VITAMIN) tab Take 1 Tab by mouth daily.  Qty: 30 Tab, Refills: 5    Associated Diagnoses: Supervision of normal pregnancy, second trimester             * Follow-up Care/Patient Instructions:  Activity: Activity as tolerated and No sex for 6 weeks  Diet: Regular Diet  Wound Care: None needed    Follow-up Information     Follow up With Details Comments Contact Info    None   None ([phone]) Patient stated that they have no PCP             Signed By:  Basilia JumboLUGBENGA S Bryli Mantey, MD     October 27, 2014

## 2014-10-27 NOTE — Other (Signed)
Bedside and Verbal shift change report given to TRACEY M GARRETT, RN   (oncoming nurse) by K. Pembroke RN (offgoing nurse). Report included the following information SBAR, Kardex and MAR.

## 2014-10-28 LAB — CULTURE, GENITAL GROUP B STREP: Culture result:: NEGATIVE

## 2014-12-10 ENCOUNTER — Emergency Department: Payer: MEDICAID

## 2014-12-10 ENCOUNTER — Emergency Department: Admit: 2014-12-11 | Payer: MEDICAID

## 2014-12-10 DIAGNOSIS — S0990XA Unspecified injury of head, initial encounter: Secondary | ICD-10-CM
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
## 2014-12-11 ENCOUNTER — Inpatient Hospital Stay
Admit: 2014-12-11 | Discharge: 2014-12-11 | Disposition: A | Discharge status: Home / Self Care | Payer: MEDICAID | Attending: Emergency Medicine

## 2014-12-11 ENCOUNTER — Emergency Department: Admit: 2014-12-11 | Payer: MEDICAID

## 2014-12-11 LAB — URINE MICROSCOPIC ONLY: WBC: 5 /hpf (ref 0–4)

## 2014-12-11 LAB — URINALYSIS W/ RFLX MICROSCOPIC
Bilirubin: NEGATIVE
Blood: NEGATIVE
Glucose: NEGATIVE mg/dL
Nitrites: POSITIVE — AB
Protein: 30 mg/dL — AB
Specific gravity: >1.03 — ABNORMAL HIGH (ref 1.005–1.030)
Urobilinogen: 1 EU/dL (ref 0.2–1.0)
pH (UA): 5.5 (ref 5.0–8.0)

## 2014-12-11 LAB — HCG URINE, QL: HCG urine, QL: NEGATIVE

## 2014-12-11 MED ORDER — PROPARACAINE 0.5 % EYE DROPS
0.5 % | OPHTHALMIC | Status: AC
Start: 2014-12-11 — End: 2014-12-10
  Administered 2014-12-11: 04:00:00 via OPHTHALMIC

## 2014-12-11 MED ORDER — HYDROCODONE-ACETAMINOPHEN 5 MG-325 MG TAB
5-325 mg | ORAL_TABLET | ORAL | 0 refills | Status: DC | PRN
Start: 2014-12-11 — End: 2017-08-06

## 2014-12-11 MED ORDER — ERYTHROMYCIN 5 MG/G EYE OINTMENT
5 mg/gram (0. %) | OPHTHALMIC | 0 refills | Status: DC
Start: 2014-12-11 — End: 2017-08-06

## 2014-12-11 MED ORDER — FLUORESCEIN 1 MG EYE STRIPS
1 mg | OPHTHALMIC | Status: AC
Start: 2014-12-11 — End: 2014-12-10
  Administered 2014-12-11: 04:00:00 via OPHTHALMIC

## 2014-12-11 MED ORDER — CEPHALEXIN 500 MG CAP
500 mg | ORAL_CAPSULE | Freq: Four times a day (QID) | ORAL | 0 refills | Status: AC
Start: 2014-12-11 — End: 2014-12-21

## 2014-12-11 MED ORDER — CEPHALEXIN 250 MG CAP
250 mg | ORAL | Status: AC
Start: 2014-12-11 — End: 2014-12-10
  Administered 2014-12-11: 04:00:00 via ORAL

## 2014-12-11 MED ORDER — HYDROCODONE-ACETAMINOPHEN 5 MG-325 MG TAB
5-325 mg | ORAL | Status: AC
Start: 2014-12-11 — End: 2014-12-10
  Administered 2014-12-11: 04:00:00 via ORAL

## 2014-12-11 MED FILL — CEPHALEXIN 250 MG CAP: 250 mg | ORAL | Qty: 2

## 2014-12-11 MED FILL — PROPARACAINE 0.5 % EYE DROPS: 0.5 % | OPHTHALMIC | Qty: 15

## 2014-12-11 MED FILL — FLUORESCEIN 1 MG EYE STRIPS: 1 mg | OPHTHALMIC | Qty: 1

## 2014-12-11 MED FILL — HYDROCODONE-ACETAMINOPHEN 5 MG-325 MG TAB: 5-325 mg | ORAL | Qty: 1

## 2014-12-11 NOTE — Other (Signed)
I have reviewed discharge instructions with the patient.  The patient verbalized understanding.  Patient armband removed and shredded
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
## 2014-12-13 LAB — CULTURE, URINE
Culture result:: 50000
Culture result:: >100000
Culture: 50000

## 2015-02-13 ENCOUNTER — Inpatient Hospital Stay
Admit: 2015-02-13 | Discharge: 2015-02-13 | Disposition: A | Discharge status: Home / Self Care | Payer: MEDICAID | Attending: Emergency Medicine

## 2015-02-13 ENCOUNTER — Emergency Department: Admit: 2015-02-13 | Payer: MEDICAID

## 2015-02-13 DIAGNOSIS — O26891 Other specified pregnancy related conditions, first trimester: Secondary | ICD-10-CM

## 2015-02-13 LAB — URINALYSIS W/ RFLX MICROSCOPIC
Bilirubin: NEGATIVE
Blood: NEGATIVE
Glucose: NEGATIVE mg/dL
Ketone: NEGATIVE mg/dL
Nitrites: NEGATIVE
Protein: NEGATIVE mg/dL
Specific gravity: 1.018 (ref 1.005–1.030)
Urobilinogen: 1 EU/dL (ref 0.2–1.0)
pH (UA): 6.5 (ref 5.0–8.0)

## 2015-02-13 LAB — METABOLIC PANEL, COMPREHENSIVE
A-G Ratio: 0.9 (ref 0.8–1.7)
ALT (SGPT): 24 U/L (ref 13–56)
AST (SGOT): 17 U/L (ref 15–37)
Albumin: 3.5 g/dL (ref 3.4–5.0)
Alk. phosphatase: 70 U/L (ref 45–117)
Anion gap: 6 mmol/L (ref 3.0–18)
BUN/Creatinine ratio: 13 (ref 12–20)
BUN: 8 MG/DL (ref 7.0–18)
Bilirubin, total: 0.3 MG/DL (ref 0.2–1.0)
CO2: 27 mmol/L (ref 21–32)
Calcium: 8.4 MG/DL — ABNORMAL LOW (ref 8.5–10.1)
Chloride: 103 mmol/L (ref 100–108)
Creatinine: 0.61 MG/DL (ref 0.6–1.3)
GFR est AA: >60 mL/min/{1.73_m2} (ref >60)
GFR est non-AA: >60 mL/min/{1.73_m2} (ref >60)
Globulin: 4 g/dL (ref 2.0–4.0)
Glucose: 98 mg/dL (ref 74–99)
Potassium: 3.5 mmol/L (ref 3.5–5.5)
Protein, total: 7.5 g/dL (ref 6.4–8.2)
Sodium: 136 mmol/L (ref 136–145)

## 2015-02-13 LAB — CBC WITH AUTOMATED DIFF
ABS. BASOPHILS: 0 10*3/uL (ref 0.0–0.1)
ABS. EOSINOPHILS: 0 10*3/uL (ref 0.0–0.4)
ABS. LYMPHOCYTES: 0.9 10*3/uL (ref 0.9–3.6)
ABS. MONOCYTES: 0.2 10*3/uL (ref 0.05–1.2)
ABS. NEUTROPHILS: 1.6 10*3/uL — ABNORMAL LOW (ref 1.8–8.0)
BASOPHILS: 1 % (ref 0–2)
EOSINOPHILS: 1 % (ref 0–5)
HCT: 34.1 % — ABNORMAL LOW (ref 35.0–45.0)
HGB: 11.8 g/dL — ABNORMAL LOW (ref 12.0–16.0)
LYMPHOCYTES: 34 % (ref 21–52)
MCH: 25.1 PG (ref 24.0–34.0)
MCHC: 34.6 g/dL (ref 31.0–37.0)
MCV: 72.6 FL — ABNORMAL LOW (ref 74.0–97.0)
MONOCYTES: 6 % (ref 3–10)
MPV: 9.4 FL (ref 9.2–11.8)
NEUTROPHILS: 58 % (ref 40–73)
PLATELET: 180 10*3/uL (ref 135–420)
RBC: 4.7 M/uL (ref 4.20–5.30)
RDW: 15.2 % — ABNORMAL HIGH (ref 11.6–14.5)
WBC: 2.8 10*3/uL — ABNORMAL LOW (ref 4.6–13.2)

## 2015-02-13 LAB — WET PREP
Wet prep: NONE SEEN
Wet prep: NONE SEEN

## 2015-02-13 LAB — URINE MICROSCOPIC ONLY: WBC: 0 /hpf (ref 0–4)

## 2015-02-13 LAB — POC LACTIC ACID: Lactic Acid (POC): 0.6 mmol/L (ref 0.4–2.0)

## 2015-02-13 LAB — INFLUENZA A & B AG (RAPID TEST)
Influenza A Antigen: NEGATIVE
Influenza B Antigen: NEGATIVE

## 2015-02-13 LAB — BETA HCG, QT
Beta HCG, QT: 48202 m[IU]/mL — ABNORMAL HIGH (ref 0–10)
hCG Quant: 48202 m[IU]/mL — ABNORMAL HIGH (ref 0–10)

## 2015-02-13 LAB — LIPASE: Lipase: 70 U/L — ABNORMAL LOW (ref 73–393)

## 2015-02-13 LAB — MAGNESIUM: Magnesium: 1.7 mg/dL — ABNORMAL LOW (ref 1.8–2.4)

## 2015-02-13 MED ORDER — ACETAMINOPHEN 500 MG TAB
500 mg | ORAL | Status: AC
Start: 2015-02-13 — End: 2015-02-13
  Administered 2015-02-13: 05:00:00 via ORAL

## 2015-02-13 MED ORDER — MAGNESIUM SULFATE IN D5W 1 GRAM/100 ML IV PIGGY BACK
1 gram/00 mL | INTRAVENOUS | Status: AC
Start: 2015-02-13 — End: 2015-02-13
  Administered 2015-02-13: 14:00:00 via INTRAVENOUS

## 2015-02-13 MED ORDER — LACTATED RINGERS BOLUS IV
Freq: Once | INTRAVENOUS | Status: AC
Start: 2015-02-13 — End: 2015-02-13
  Administered 2015-02-13: 09:00:00 via INTRAVENOUS

## 2015-02-13 MED FILL — TYLENOL EXTRA STRENGTH 500 MG TABLET: 500 mg | ORAL | Qty: 2

## 2015-02-13 MED FILL — LACTATED RINGERS IV: INTRAVENOUS | Qty: 1000

## 2015-02-13 MED FILL — MAGNESIUM SULFATE IN D5W 1 GRAM/100 ML IV PIGGY BACK: 1 gram/00 mL | INTRAVENOUS | Qty: 100

## 2015-02-13 NOTE — ED Notes (Signed)
Patient resting on stretcher waiting for ultrasound.

## 2015-02-13 NOTE — ED Notes (Signed)
Patient unable to tolerate Influenza testing. PA, Onalee Huaavid notified.

## 2015-02-13 NOTE — ED Notes (Signed)
Patient provided with a fresh gown and warm blanket to change and cover up in. Tech: [employer] at bedside completing lab work.

## 2015-02-13 NOTE — ED Notes (Signed)
Influenza testing complete and sent to the lab.

## 2015-02-13 NOTE — ED Provider Notes (Addendum)
HPI 27 YOF here for fever and generalized body aches for 4-5 days. Patient says that she started having body aches and fever thanksgiving morning before she ate anything.  She says that she also has had left lower quadrant pain for 4-5 days as well.  She says she went to the health department and found out she was pregnant this past Tuesday at the health department.  She denies vaginal bleeding or discharge, fevers, chills, SOB, chest pain, calf pains, but had some left flank pain.      Past Medical History:   Diagnosis Date   ??? ADHD (attention deficit hyperactivity disorder)    ??? Bipolar disorder (HCC)    ??? BV (bacterial vaginosis)      On Several Occasions   ??? Chlamydia 04/25/2011     07/07/14, 04/12/14, 02/14/14, 07/03/12, 04/25/11    ??? Depression    ??? Gestational diabetes    ??? Gonorrhea 01/27/2013     07/07/14, 02/14/14, 01/27/13   ??? Headache 03/10/2014     CT Head W/O Contrast: Normal   ??? Hypokalemia 05/22/2011     3.4 mmol/L   ??? Hypomagnesemia 03/09/2014     1.5 mg/dL   ??? Hyponatremia 01/03/2011     132 mmol/L   ??? Hypothyroidism    ??? Microcytic anemia 01/01/2008   ??? Morbid obesity (HCC)    ??? OCD (obsessive compulsive disorder)    ??? Recurrent UTI    ??? Sacroiliitis (HCC) 11/12/2011     Noted on Lumbar X-ray: Left Sided Sacroliitis    ??? Strep throat 05/22/2011     + RS   ??? Vision decreased        Past Surgical History:   Procedure Laterality Date   ??? Hx dilation and curettage  09/2013         Family History:   Problem Relation Age of Onset   ??? Hypertension Maternal Grandmother    ??? Hypertension Maternal Grandfather    ??? Hypertension Paternal Grandfather    ??? Hypertension Mother    ??? Asthma Mother    ??? No Known Problems Brother    ??? No Known Problems Sister        Social History     Social History   ??? Marital status: SINGLE     Spouse name: N/A   ??? Number of children: N/A   ??? Years of education: N/A     Occupational History   ??? Not on file.     Social History Main Topics   ??? Smoking status: Never Smoker    ??? Smokeless tobacco: Not on file   ??? Alcohol use No   ??? Drug use: No   ??? Sexual activity: Yes     Partners: Male     Birth control/ protection: None     Other Topics Concern   ??? Not on file     Social History Narrative         ALLERGIES: Percocet [oxycodone-acetaminophen]    Review of Systems   Constitutional: Positive for fever. Negative for appetite change, chills, diaphoresis and fatigue.   HENT: Negative.    Eyes: Negative.    Respiratory: Negative.    Cardiovascular: Negative.    Gastrointestinal: Positive for abdominal pain. Negative for abdominal distention, anal bleeding, blood in stool, constipation, diarrhea, nausea and vomiting.   Genitourinary: Positive for flank pain. Negative for hematuria, pelvic pain, vaginal bleeding, vaginal discharge and vaginal pain.   Musculoskeletal: Positive for back pain. Negative  for gait problem and neck pain.   Skin: Negative.    Neurological: Negative.    Psychiatric/Behavioral: Negative.    All other systems reviewed and are negative.      Vitals:    02/13/15 0017   BP: 160/85   Pulse: 99   Resp: 20   Temp: (!) 101.4 ??F (38.6 ??C)   SpO2: 99%   Weight: 111.6 kg (246 lb)   Height: 5\' 5"  (1.651 m)            Physical Exam   Constitutional: She is oriented to person, place, and time. She appears well-developed and well-nourished.   HENT:   Head: Normocephalic and atraumatic.   Right Ear: External ear normal.   Left Ear: External ear normal.   Nose: Nose normal.   Mouth/Throat: Oropharynx is clear and moist.   Eyes: Conjunctivae and EOM are normal. Pupils are equal, round, and reactive to light.   Neck: Normal range of motion. Neck supple.   No meningismus   Cardiovascular: Normal rate, regular rhythm, normal heart sounds and intact distal pulses.    Pulmonary/Chest: Effort normal and breath sounds normal.   Abdominal: Soft. Bowel sounds are normal. She exhibits no distension. There is tenderness (left flank, LLQ. negative CVA tenderness bilaterally.  ). There is no rebound and no guarding.   Genitourinary: No vaginal discharge found.   Genitourinary Comments: Exam done with female RN standby present, bimanual no Cervical motion tenderness or left or right adnexa tenderness.    Musculoskeletal: Normal range of motion. She exhibits no edema or tenderness.   Calves non-tender bilaterally.    Lymphadenopathy:     She has no cervical adenopathy.   Neurological: She is alert and oriented to person, place, and time.   Skin: Skin is warm and dry.   Psychiatric: She has a normal mood and affect. Her behavior is normal.   Nursing note and vitals reviewed.       MDM  Number of Diagnoses or Management Options  Body aches:   Fever, unspecified fever cause:   Hypomagnesemia:   Left ovarian cyst:   Normal pregnancy in first trimester:   Diagnosis management comments: Differentials discussed with the patient. Workup for now. Mag slightly low, Mag ordered, patient refused flu test. Ultrasounds pending, I will sign out to my colleague Lawson RadarJeff Gesselle [facility] for disposition.     24yo pregnant female presenting to ER with body aches and chills.  Admits to left side abdominal discomfort.  She did have fever upon presentation. Tylenol was given.  On my exam patient did not have any abdominopelvic TTP.  Labs reassuring.  Magnesium replaced.  US of abdomen and TVUS unremarkable other than left ovarian cyst seen.  Live IUP dated 6wks.   Discharge with OB follow up.  Tylenol for fever or pain.        Amount and/or Complexity of Data Reviewed  Clinical lab tests: ordered and reviewed    Risk of Complications, Morbidity, and/or Mortality  Presenting problems: moderate  Diagnostic procedures: moderate  Management options: moderate    Patient Progress  Patient progress: stable      6:12 AM  RECEIVED PATIENT TURNOVER FROM DAVID CARY, PA-C.  PATIENT IS 24YO FEMALE WHO IS ABOUT [redacted] WEEKS PREGNANT ACCORDING TO HER LMP, SEEN AT HEALTH  DEPARTMENT RECENTLY WITH POSITIVE PREGNANCY TEST.  PRESENTS TO ER VIA EMS FOR "SHAKING" ACCORDING TO PATIENT.  ADMITS TO BODY ACHES THAT STARTED 4 DAYS AGO. ALSO ADMITS TO LEFT  SIDE ABDOMINAL DISCOMFORT THAT STARTED YESTERDAY DESCRIBED AS TIGHT, PULLING, "BALLED UP" PAIN.  ADMITS TO MILD NASAL CONGESTION.  DENIES ANY COUGH, SOB, SORE THROAT, URINARY COMPLAINTS, VAGINAL DISCHARGE OR BLEEDING.  NO NAUSEA OR VOMITING BUT ADMITS TO DECREASED APPETITE.    EXAM- PATIENT RESTING COMFORTABLY.  THROAT NORMAL, SLIGHT SNIFFLING BY PATIENT BUT WITHOUT GROSS RHINORRHEA, HEART RRR WITHOUT MGR, LUNGS CTA BILATERALLY.  ABDOMEN GOOD BOWEL SOUNDS, SOFT AND NON TENDER WITHOUT GUARDING, RIGIDITY, REBOUND.   NO CVA TENDERNESS.  PELVIC EXAM PERFORMED BY PA CARY, REPORTED AS UNREMARKABLE.   I HAVE REVIEWED HER CURRENT LABS.  MAG ORDERED FOR MAG OF 1.7.  WBC 2.8,  LACTIC ACID NORMAL.  UA WITHOUT GROSS SIGNS OF INFECTION.  BLOOD CULTURE AND URINE CULTURE PENDING.  PATIENT AWAITING ULTRASOUND OF ABDOMEN AND TVUS.    ED Course       Procedures    Labs Reviewed   CBC WITH AUTOMATED DIFF - Abnormal; Notable for the following:        Result Value    WBC 2.8 (*)     HGB 11.8 (*)     HCT 34.1 (*)     MCV 72.6 (*)     RDW 15.2 (*)     ABS. NEUTROPHILS 1.6 (*)     All other components within normal limits   METABOLIC PANEL, COMPREHENSIVE - Abnormal; Notable for the following:     Calcium 8.4 (*)     All other components within normal limits   MAGNESIUM - Abnormal; Notable for the following:     Magnesium 1.7 (*)     All other components within normal limits   TOTAL HCG, QT. - Abnormal; Notable for the following:     HCG, Qt. [address] (*)     All other components within normal limits   WET PREP   CULTURE, BLOOD   CULTURE, BLOOD   INFLUENZA A & B AG (RAPID TEST)   CHLAMYDIA/NEISSERIA AMPLIFICATION   URINALYSIS W/ RFLX MICROSCOPIC   POC LACTIC ACID

## 2015-02-13 NOTE — ED Notes (Signed)
I have reviewed discharge instructions with the patient.  The patient verbalized understanding.

## 2015-02-13 NOTE — ED Triage Notes (Signed)
"  My body is hurting all over.  I feel cold even when I have the heat on."

## 2015-02-13 NOTE — ED Notes (Signed)
Patient resting on stretcher.

## 2015-02-13 NOTE — ED Notes (Signed)
Bedside shift change report given to Shelly, RN (oncoming nurse) by Hope, RN (offgoing nurse). Report included the following information SBAR, ED Summary and MAR.

## 2015-02-14 LAB — CULTURE, URINE
Culture result:: 50000
Culture result:: 75000
Culture: 50000
Culture: 75000

## 2015-02-15 LAB — CHLAMYDIA/NEISSERIA AMPLIFICATION
Chlamydia amplification: NEGATIVE
N. gonorrhoeae amplification: NEGATIVE

## 2015-02-19 LAB — CULTURE, BLOOD: Culture result:: NO GROWTH

## 2015-05-04 ENCOUNTER — Emergency Department (HOSPITAL_COMMUNITY): Payer: Medicaid Other

## 2015-05-04 ENCOUNTER — Emergency Department (HOSPITAL_COMMUNITY)
Admission: EM | Admit: 2015-05-04 | Discharge: 2015-05-04 | Disposition: A | Discharge status: Home / Self Care | Payer: Medicaid Other | Attending: Emergency Medicine | Admitting: Emergency Medicine

## 2015-05-04 ENCOUNTER — Encounter (HOSPITAL_COMMUNITY): Payer: Self-pay | Admitting: Emergency Medicine

## 2015-05-04 DIAGNOSIS — S233XXA Sprain of ligaments of thoracic spine, initial encounter: Secondary | ICD-10-CM

## 2015-05-04 DIAGNOSIS — S29012A Strain of muscle and tendon of back wall of thorax, initial encounter: Secondary | ICD-10-CM | POA: Insufficient documentation

## 2015-05-04 DIAGNOSIS — Y9389 Activity, other specified: Secondary | ICD-10-CM | POA: Diagnosis not present

## 2015-05-04 DIAGNOSIS — Y9289 Other specified places as the place of occurrence of the external cause: Secondary | ICD-10-CM | POA: Insufficient documentation

## 2015-05-04 DIAGNOSIS — Z3202 Encounter for pregnancy test, result negative: Secondary | ICD-10-CM | POA: Insufficient documentation

## 2015-05-04 DIAGNOSIS — Y998 Other external cause status: Secondary | ICD-10-CM | POA: Insufficient documentation

## 2015-05-04 DIAGNOSIS — Z8639 Personal history of other endocrine, nutritional and metabolic disease: Secondary | ICD-10-CM | POA: Diagnosis not present

## 2015-05-04 DIAGNOSIS — S239XXA Sprain of unspecified parts of thorax, initial encounter: Secondary | ICD-10-CM | POA: Insufficient documentation

## 2015-05-04 DIAGNOSIS — S299XXA Unspecified injury of thorax, initial encounter: Secondary | ICD-10-CM | POA: Diagnosis present

## 2015-05-04 HISTORY — DX: Disorder of thyroid, unspecified: E07.9

## 2015-05-04 LAB — POC URINE PREG, ED: Preg Test, Ur: NEGATIVE
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
## 2015-06-30 ENCOUNTER — Emergency Department (HOSPITAL_COMMUNITY)
Admission: EM | Admit: 2015-06-30 | Discharge: 2015-06-30 | Disposition: A | Discharge status: Home / Self Care | Payer: Medicaid Other | Attending: Emergency Medicine | Admitting: Emergency Medicine

## 2015-06-30 ENCOUNTER — Encounter (HOSPITAL_COMMUNITY): Payer: Self-pay | Admitting: *Deleted

## 2015-06-30 ENCOUNTER — Emergency Department (HOSPITAL_COMMUNITY): Payer: Medicaid Other

## 2015-06-30 DIAGNOSIS — S29002A Unspecified injury of muscle and tendon of back wall of thorax, initial encounter: Secondary | ICD-10-CM | POA: Diagnosis present

## 2015-06-30 DIAGNOSIS — W2201XA Walked into wall, initial encounter: Secondary | ICD-10-CM | POA: Insufficient documentation

## 2015-06-30 DIAGNOSIS — Y998 Other external cause status: Secondary | ICD-10-CM | POA: Insufficient documentation

## 2015-06-30 DIAGNOSIS — Y9302 Activity, running: Secondary | ICD-10-CM | POA: Diagnosis not present

## 2015-06-30 DIAGNOSIS — Y9289 Other specified places as the place of occurrence of the external cause: Secondary | ICD-10-CM | POA: Insufficient documentation

## 2015-06-30 DIAGNOSIS — S29001A Unspecified injury of muscle and tendon of front wall of thorax, initial encounter: Secondary | ICD-10-CM | POA: Insufficient documentation

## 2015-06-30 DIAGNOSIS — K921 Melena: Secondary | ICD-10-CM | POA: Diagnosis not present

## 2015-06-30 DIAGNOSIS — E669 Obesity, unspecified: Secondary | ICD-10-CM | POA: Insufficient documentation

## 2015-06-30 DIAGNOSIS — M5489 Other dorsalgia: Secondary | ICD-10-CM

## 2015-06-30 HISTORY — DX: Obesity, unspecified: E66.9

## 2015-06-30 LAB — COMPREHENSIVE METABOLIC PANEL
ALK PHOS: 66 U/L (ref 38–126)
ALT: 20 U/L (ref 14–54)
AST: 27 U/L (ref 15–41)
Albumin: 3.8 g/dL (ref 3.5–5.0)
Anion gap: 11 (ref 5–15)
BUN: 6 mg/dL (ref 6–20)
CALCIUM: 9.1 mg/dL (ref 8.9–10.3)
CHLORIDE: 107 mmol/L (ref 101–111)
CO2: 21 mmol/L — AB (ref 22–32)
CREATININE: 0.49 mg/dL (ref 0.44–1.00)
GFR calc Af Amer: >60 mL/min (ref >60)
Glucose, Bld: 88 mg/dL (ref 65–99)
Potassium: 3.5 mmol/L (ref 3.5–5.1)
Sodium: 139 mmol/L (ref 135–145)
Total Bilirubin: 0.4 mg/dL (ref 0.3–1.2)
Total Protein: 7.2 g/dL (ref 6.5–8.1)

## 2015-06-30 LAB — URINALYSIS, ROUTINE W REFLEX MICROSCOPIC
BILIRUBIN URINE: NEGATIVE
GLUCOSE, UA: NEGATIVE mg/dL
KETONES UR: NEGATIVE mg/dL
Nitrite: NEGATIVE
PROTEIN: NEGATIVE mg/dL
Specific Gravity, Urine: 1.031 — ABNORMAL HIGH (ref 1.005–1.030)
pH: 5.5 (ref 5.0–8.0)

## 2015-06-30 LAB — I-STAT BETA HCG BLOOD, ED (MC, WL, AP ONLY): I-stat hCG, quantitative: <5 m[IU]/mL (ref <5)

## 2015-06-30 LAB — CBC
HCT: 36.6 % (ref 36.0–46.0)
Hemoglobin: 12.4 g/dL (ref 12.0–15.0)
MCH: 24.7 pg — AB (ref 26.0–34.0)
MCHC: 33.9 g/dL (ref 30.0–36.0)
MCV: 72.9 fL — AB (ref 78.0–100.0)
PLATELETS: 258 10*3/uL (ref 150–400)
RBC: 5.02 MIL/uL (ref 3.87–5.11)
RDW: 15.1 % (ref 11.5–15.5)
WBC: 6.2 10*3/uL (ref 4.0–10.5)

## 2015-06-30 LAB — URINE MICROSCOPIC-ADD ON

## 2015-06-30 LAB — LIPASE, BLOOD: LIPASE: 17 U/L (ref 11–51)

## 2015-06-30 MED ORDER — MELOXICAM 7.5 MG PO TABS: 7.5000 mg | ORAL_TABLET | Freq: Every day | ORAL | Status: AC | PRN

## 2015-06-30 NOTE — ED Notes (Signed)
PA-C at bedside 

## 2015-06-30 NOTE — ED Notes (Signed)
Pt. Verbalizes understanding of d/c instructions and prescriptions. Pt. Ambulatory out of the unit with no concerns at this time. Pt. Displays no s/s of distress.

## 2015-06-30 NOTE — Discharge Instructions (Signed)
Read the information below.  Use the prescribed medication as directed.  Please discuss all new medications with your pharmacist.  You may return to the Emergency Department at any time for worsening condition or any new symptoms that concern you.    If you develop fevers, chest pain, difficulty breathing, severe cough, return to the ER immediately.    Back Pain, Adult Back pain is very common. The pain often gets better over time. The cause of back pain is usually not dangerous. Most people can learn to manage their back pain on their own.  HOME CARE  Watch your back pain for any changes. The following actions may help to lessen any pain you are feeling:  Stay active. Start with short walks on flat ground if you can. Try to walk farther each day.  Exercise regularly as told by your doctor. Exercise helps your back heal faster. It also helps avoid future injury by keeping your muscles strong and flexible.  Do not sit, drive, or stand in one place for more than 30 minutes.  Do not stay in bed. Resting more than 1-2 days can slow down your recovery.  Be careful when you bend or lift an object. Use good form when lifting:  Bend at your knees.  Keep the object close to your body.  Do not twist.  Sleep on a firm mattress. Lie on your side, and bend your knees. If you lie on your back, put a pillow under your knees.  Take medicines only as told by your doctor.  Put ice on the injured area.  Put ice in a plastic bag.  Place a towel between your skin and the bag.  Leave the ice on for 20 minutes, 2-3 times a day for the first 2-3 days. After that, you can switch between ice and heat packs.  Avoid feeling anxious or stressed. Find good ways to deal with stress, such as exercise.  Maintain a healthy weight. Extra weight puts stress on your back. GET HELP IF:   You have pain that does not go away with rest or medicine.  You have worsening pain that goes down into your legs or  buttocks.  You have pain that does not get better in one week.  You have pain at night.  You lose weight.  You have a fever or chills. GET HELP RIGHT AWAY IF:   You cannot control when you poop (bowel movement) or pee (urinate).  Your arms or legs feel weak.  Your arms or legs lose feeling (numbness).  You feel sick to your stomach (nauseous) or throw up (vomit).  You have belly (abdominal) pain.  You feel like you may pass out (faint).   This information is not intended to replace advice given to you by your health care provider. Make sure you discuss any questions you have with your health care provider.   Document Released: 08/21/2007 Document Revised: 03/25/2014 Document Reviewed: 07/06/2013 Elsevier Interactive Patient Education [employer]2016 [employer]

## 2015-06-30 NOTE — ED Notes (Signed)
Pt reports having her menstrual 3/21 but having lower abd pains that feel like "movements". Also having right side rib pain that increases with movement. Denies vaginal discharge, itching or urinary symptoms.
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
## 2015-08-02 ENCOUNTER — Emergency Department: Admit: 2015-08-03 | Payer: MEDICAID

## 2015-08-02 ENCOUNTER — Emergency Department: Payer: MEDICAID

## 2015-08-02 ENCOUNTER — Inpatient Hospital Stay
Admit: 2015-08-02 | Discharge: 2015-08-03 | Disposition: A | Discharge status: Home / Self Care | Payer: MEDICAID | Attending: Emergency Medicine

## 2015-08-02 DIAGNOSIS — O2341 Unspecified infection of urinary tract in pregnancy, first trimester: Secondary | ICD-10-CM

## 2015-08-02 LAB — CBC WITH AUTOMATED DIFF
ABS. BASOPHILS: 0 10*3/uL (ref 0.0–0.1)
ABS. EOSINOPHILS: 0.1 10*3/uL (ref 0.0–0.4)
ABS. LYMPHOCYTES: 2.1 10*3/uL (ref 0.9–3.6)
ABS. MONOCYTES: 0.3 10*3/uL (ref 0.05–1.2)
ABS. NEUTROPHILS: 3 10*3/uL (ref 1.8–8.0)
BASOPHILS: 0 % (ref 0–2)
EOSINOPHILS: 2 % (ref 0–5)
HCT: 33.1 % — ABNORMAL LOW (ref 35.0–45.0)
HGB: 11.5 g/dL — ABNORMAL LOW (ref 12.0–16.0)
LYMPHOCYTES: 37 % (ref 21–52)
MCH: 25.4 PG (ref 24.0–34.0)
MCHC: 34.7 g/dL (ref 31.0–37.0)
MCV: 73.1 FL — ABNORMAL LOW (ref 74.0–97.0)
MONOCYTES: 6 % (ref 3–10)
MPV: 8.8 FL — ABNORMAL LOW (ref 9.2–11.8)
NEUTROPHILS: 55 % (ref 40–73)
PLATELET: 215 10*3/uL (ref 135–420)
RBC: 4.53 M/uL (ref 4.20–5.30)
RDW: 15.7 % — ABNORMAL HIGH (ref 11.6–14.5)
WBC: 5.5 10*3/uL (ref 4.6–13.2)

## 2015-08-02 LAB — METABOLIC PANEL, COMPREHENSIVE
A-G Ratio: 0.9 (ref 0.8–1.7)
ALT (SGPT): 23 U/L (ref 13–56)
AST (SGOT): 17 U/L (ref 15–37)
Albumin: 3.4 g/dL (ref 3.4–5.0)
Alk. phosphatase: 57 U/L (ref 45–117)
Anion gap: 8 mmol/L (ref 3.0–18)
BUN/Creatinine ratio: 11 — ABNORMAL LOW (ref 12–20)
BUN: 5 MG/DL — ABNORMAL LOW (ref 7.0–18)
Bilirubin, total: 0.3 MG/DL (ref 0.2–1.0)
CO2: 24 mmol/L (ref 21–32)
Calcium: 8.9 MG/DL (ref 8.5–10.1)
Chloride: 105 mmol/L (ref 100–108)
Creatinine: 0.47 MG/DL — ABNORMAL LOW (ref 0.6–1.3)
GFR est AA: >60 mL/min/{1.73_m2} (ref >60)
GFR est non-AA: >60 mL/min/{1.73_m2} (ref >60)
Globulin: 3.8 g/dL (ref 2.0–4.0)
Glucose: 79 mg/dL (ref 74–99)
Potassium: 3.5 mmol/L (ref 3.5–5.5)
Protein, total: 7.2 g/dL (ref 6.4–8.2)
Sodium: 137 mmol/L (ref 136–145)

## 2015-08-02 LAB — LIPASE: Lipase: 62 U/L — ABNORMAL LOW (ref 73–393)

## 2015-08-02 LAB — HCG URINE, QL: HCG urine, QL: POSITIVE — AB

## 2015-08-02 MED ORDER — KETOROLAC TROMETHAMINE 30 MG/ML INJECTION
30 mg/mL (1 mL) | INTRAMUSCULAR | Status: DC
Start: 2015-08-02 — End: 2015-08-03

## 2015-08-02 MED ORDER — ONDANSETRON (PF) 4 MG/2 ML INJECTION
4 mg/2 mL | Freq: Once | INTRAMUSCULAR | Status: AC
Start: 2015-08-02 — End: 2015-08-02
  Administered 2015-08-02: 23:00:00 via INTRAVENOUS

## 2015-08-02 MED ORDER — SODIUM CHLORIDE 0.9% BOLUS IV
0.9 % | Freq: Once | INTRAVENOUS | Status: AC
Start: 2015-08-02 — End: 2015-08-02
  Administered 2015-08-02: 23:00:00 via INTRAVENOUS

## 2015-08-02 MED FILL — SODIUM CHLORIDE 0.9 % IV: INTRAVENOUS | Qty: 1000

## 2015-08-02 MED FILL — KETOROLAC TROMETHAMINE 30 MG/ML INJECTION: 30 mg/mL (1 mL) | INTRAMUSCULAR | Qty: 1

## 2015-08-02 MED FILL — ONDANSETRON (PF) 4 MG/2 ML INJECTION: 4 mg/2 mL | INTRAMUSCULAR | Qty: 2

## 2015-08-02 NOTE — ED Triage Notes (Signed)
NAUSEA AND RIGHT ABD PAIN/ RIGHT LOWER RIB PAIN, NOT SLEEPING WELL/ LMP 4/25

## 2015-08-02 NOTE — ED Notes (Signed)
Hourly rounding complete.   Safety  Pt resting   [ x ]  On stretcher with side rails up and bed in locked position, call bell within reach  [  ]  Sitting in chair with casters locked, call bell within reach    Toileting  [ x ] pt denies need to use bathroom  [  ] pt assisted to bathroom  [  ] pt assisted with bedpan  [  ] pt independent to bathroom as needed    Ongoing Plan of Care  Plan of care and expected time for test and results reviewed with pt.    Pain Management / Comfort  [  ] dimmed lights  [ x ] warm blanket provided  [ x ] pain assessed  [  ] monitor alarms reviewed

## 2015-08-02 NOTE — ED Provider Notes (Signed)
HPI Comments: 25yo F c/o abdominal pain and chest tightness.  Chest tightness, cough, nasal congestion all started around the same time.  Cough is productive.  Hurts to cough.  No fevers.  Abdominal pain is right sided sharp pain a./w  Occasional cramping.  She has been too nauseous to eat.  She admits to dysuria.  Denies diarrhea and constipation.  LMP 07/11/15.  No abnormal discharge.      Patient is a 25 y.o. female presenting with nausea and abdominal pain.   Nausea    Associated symptoms include abdominal pain and cough. Pertinent negatives include no fever and no diarrhea.   Abdominal Pain    Associated symptoms include nausea and dysuria. Pertinent negatives include no fever, no diarrhea, no vomiting, no constipation and no chest pain.        Past Medical History:   Diagnosis Date   ??? ADHD (attention deficit hyperactivity disorder)    ??? Bipolar disorder (HCC)    ??? BV (bacterial vaginosis)     On Several Occasions   ??? Chlamydia 04/25/2011    07/07/14, 04/12/14, 02/14/14, 07/03/12, 04/25/11    ??? Depression    ??? Gestational diabetes    ??? Gonorrhea 01/27/2013    07/07/14, 02/14/14, 01/27/13   ??? Headache 03/10/2014    CT Head W/O Contrast: Normal   ??? Hypokalemia 05/22/2011    3.4 mmol/L   ??? Hypomagnesemia 03/09/2014    1.5 mg/dL   ??? Hyponatremia 01/03/2011    132 mmol/L   ??? Hypothyroidism    ??? Microcytic anemia 01/01/2008   ??? Morbid obesity (HCC)    ??? OCD (obsessive compulsive disorder)    ??? Recurrent UTI    ??? Sacroiliitis (HCC) 11/12/2011    Noted on Lumbar X-ray: Left Sided Sacroliitis    ??? Strep throat 05/22/2011    + RS   ??? Vision decreased        Past Surgical History:   Procedure Laterality Date   ??? HX DILATION AND CURETTAGE  09/2013         Family History:   Problem Relation Age of Onset   ??? Hypertension Maternal Grandmother    ??? Hypertension Maternal Grandfather    ??? Hypertension Paternal Grandfather    ??? Hypertension Mother    ??? Asthma Mother    ??? No Known Problems Brother    ??? No Known Problems Sister         Social History     Social History   ??? Marital status: SINGLE     Spouse name: N/A   ??? Number of children: N/A   ??? Years of education: N/A     Occupational History   ??? Not on file.     Social History Main Topics   ??? Smoking status: Never Smoker   ??? Smokeless tobacco: Not on file   ??? Alcohol use No   ??? Drug use: No   ??? Sexual activity: Yes     Partners: Male     Birth control/ protection: None     Other Topics Concern   ??? Not on file     Social History Narrative         ALLERGIES: Percocet [oxycodone-acetaminophen]    Review of Systems   Constitutional: Negative for fever.   HENT: Positive for congestion. Negative for facial swelling.    Eyes: Negative for visual disturbance.   Respiratory: Positive for cough and chest tightness. Negative for shortness of breath.  Cardiovascular: Negative for chest pain.   Gastrointestinal: Positive for abdominal pain and nausea. Negative for constipation, diarrhea and vomiting.   Genitourinary: Positive for dysuria. Negative for menstrual problem and vaginal discharge.   Musculoskeletal: Negative for neck pain.   Skin: Negative for rash.   Neurological: Negative for dizziness.   Psychiatric/Behavioral: Negative for confusion.   All other systems reviewed and are negative.      Vitals:    08/02/15 1549   BP: 135/74   Pulse: 83   Resp: 20   Temp: 98.6 ??F (37 ??C)   SpO2: 100%   Weight: 141.1 kg (311 lb)   Height:  (1.651 m)            Physical Exam   Constitutional: She is oriented to person, place, and time. She appears well-developed and well-nourished. No distress.   HENT:   Head: Normocephalic and atraumatic.   Right Ear: Tympanic membrane, external ear and ear canal normal.   Left Ear: Tympanic membrane, external ear and ear canal normal.   Nose: Nose normal. Right sinus exhibits no maxillary sinus tenderness and no frontal sinus tenderness. Left sinus exhibits no maxillary sinus tenderness and no frontal sinus tenderness.    Mouth/Throat: Uvula is midline, oropharynx is clear and moist and mucous membranes are normal. No oropharyngeal exudate, posterior oropharyngeal edema, posterior oropharyngeal erythema or tonsillar abscesses.   Eyes: Conjunctivae are normal.   Neck: Normal range of motion. Neck supple.   Cardiovascular: Normal rate, regular rhythm and normal heart sounds.    Pulmonary/Chest: Effort normal and breath sounds normal. She has no wheezes. She has no rales.   Abdominal: She exhibits no distension. There is tenderness in the right lower quadrant, suprapubic area and left lower quadrant.   Genitourinary: Uterus normal. Cervix exhibits no motion tenderness, no discharge and no friability. Right adnexum displays no tenderness. Left adnexum displays no tenderness. Vaginal discharge found.   Genitourinary Comments: Os closed.  White discharge.  No tenderness.      Musculoskeletal: Normal range of motion.   Lymphadenopathy:     She has no cervical adenopathy.   Neurological: She is alert and oriented to person, place, and time.   Skin: Skin is warm and dry. She is not diaphoretic.   Psychiatric: She has a normal mood and affect.   Nursing note and vitals reviewed.       MDM  Number of Diagnoses or Management Options  Abdominal pain during pregnancy, first trimester: new and requires workup  BV (bacterial vaginosis): new and requires workup  Pregnancy test performed, pregnancy confirmed: new and requires workup  Trichomoniasis of vagina: new and requires workup  UTI (urinary tract infection) during pregnancy, first trimester: new and requires workup  Diagnosis management comments: Lower abdominal pain a/w nausea, dysuria, and loss of appetite.  TTP RLQ, LLQ, suprapubic.    Cough, chest tightness, nasal congestion.      Positive pregnancy.  [place] confirms IUP 6 wk 3 d.  No artierial flow could be found to right ovary but patient is not in significant pain and I do not  believe that this is ovarian torsion based on presentation.  Her symptoms are better explained by UTI.   + BV. +trich + UTI.  Patient opted for treated today with zithromax and rocephin.  Informed of testing process; call patient in 3 days if tests are positive.  Discussed the limited number of STDs that are tested for in ED and referred to health department  for full STD testing.  Recommended 7 days abstinence after completion of treatment, safe sex, and informing all sexual partners so that they can be tested and treated as well.  Patient expressed understanding.       Discussed treatment plan, return precautions, symptomatic relief, and expected time to improvement.  All questions answered. Patient is stable for discharge and outpatient management.           Amount and/or Complexity of Data Reviewed  Clinical lab tests: ordered and reviewed  Tests in the radiology section of CPT??: ordered and reviewed    Risk of Complications, Morbidity, and/or Mortality  Presenting problems: moderate  Diagnostic procedures: moderate  Management options: moderate      ED Course       Pelvic Exam  Date/Time: 08/02/2015 8:30 PM  Performed by: PA  Type of exam performed: speculum and bimanual.    External genitalia appearance: normal.    Vaginal exam:  discharge.    The amount of discharge was:  mild.  The discharge was white.   Cervical exam:  normal, os closed and no cervical motion tenderness.    Specimen(s) collected:  chlamydia, GC and vaginal culture.  Bimanual exam:  normal.    Patient tolerance: Patient tolerated the procedure well with no immediate complications                 Diagnosis:   1. Pregnancy test performed, pregnancy confirmed    2. Abdominal pain during pregnancy, first trimester    3. UTI (urinary tract infection) during pregnancy, first trimester    4. Trichomoniasis of vagina    5. BV (bacterial vaginosis)          Disposition: home    Follow-up Information     Follow up With Details Comments Contact Info     Basilia Jumbolugbenga S Oredein, MD Schedule an appointment as soon as possible for a visit  [address]  [address]  [place] [id number]  [phone]      [facility] EM[facility]  Immediately if symptoms worsen [address]  [place] [id number]  [phone]          Patient's Medications   Start Taking    CEPHALEXIN (KEFLEX) 500 MG CAPSULE    Take 1 Cap by mouth two (2) times a day for 7 days.    METRONIDAZOLE (FLAGYL) 500 MG TABLET    Take 1 Tab by mouth two (2) times a day for 7 days.   Continue Taking    ERYTHROMYCIN (ILOTYCIN) OPHTHALMIC OINTMENT    Apply OD 6 times per day x 10 days. Apply 1 cm ribbon to the lower conjunctival sac.    FERROUS SULFATE 325 MG (65 MG IRON) TABLET    Take 1 Tab by mouth three (3) times daily (with meals). Indications: IRON DEFICIENCY ANEMIA    HYDROCODONE-ACETAMINOPHEN (NORCO) 5-325 MG PER TABLET    Take 1 Tab by mouth every four (4) hours as needed (BREAKTHROUGH PAIN ONLY, DO NOT FILL UNLESS KEFLEX & ERYTHROMYCIN OINTMENT ARE FILLED.). Max Daily Amount: 6 Tabs.    IBUPROFEN (MOTRIN) 800 MG TABLET    Take 1 Tab by mouth every eight (8) hours as needed. Indications: PAIN    LEVOTHYROXINE (SYNTHROID) 75 MCG TABLET    Take 1 Tab by mouth Daily (before breakfast). Indications: HYPOTHYROIDISM    PRENATAL MULTIVIT-CA-MIN-FE-FA (PRENATAL VITAMIN) TAB    Take 1 Tab by mouth daily.   These Medications have changed  No medications on file   Stop Taking    No medications on file     Leshaun Biebel A Elliannah Wayment, PA-C

## 2015-08-02 NOTE — ED Notes (Signed)
Patient (s)  given copy of dc instructions and  script(s).  Patient (s)  verbalized understanding of instructions and script (s).  Patient given a current medication reconciliation form and verbalized understanding of their medications.   Patient (s) verbalized understanding of the importance of discussing medications with  his or her physician or clinic they will be following up with.  Patient alert and oriented and in no acute distress.  Patient discharged home ambulatory with self.

## 2015-08-03 LAB — URINE MICROSCOPIC ONLY
RBC: NEGATIVE /hpf (ref 0–5)
WBC: 10 /hpf (ref 0–4)

## 2015-08-03 LAB — BETA HCG, QT
Beta HCG, QT: 66685 m[IU]/mL — ABNORMAL HIGH (ref 0–10)
hCG Quant: 66685 m[IU]/mL — ABNORMAL HIGH (ref 0–10)

## 2015-08-03 LAB — URINALYSIS W/ RFLX MICROSCOPIC
Bilirubin: NEGATIVE
Blood: NEGATIVE
Glucose: NEGATIVE mg/dL
Ketone: 15 mg/dL — AB
Nitrites: NEGATIVE
Protein: NEGATIVE mg/dL
Specific gravity: 1.017 (ref 1.005–1.030)
Urobilinogen: 1 EU/dL (ref 0.2–1.0)
pH (UA): 6 (ref 5.0–8.0)

## 2015-08-03 LAB — WET PREP: Wet prep: NONE SEEN

## 2015-08-03 MED ORDER — METRONIDAZOLE 500 MG TAB
500 mg | ORAL_TABLET | Freq: Two times a day (BID) | ORAL | 0 refills | Status: AC
Start: 2015-08-03 — End: 2015-08-09

## 2015-08-03 MED ORDER — AZITHROMYCIN 250 MG TAB
250 mg | ORAL | Status: AC
Start: 2015-08-03 — End: 2015-08-02
  Administered 2015-08-03: 03:00:00 via ORAL

## 2015-08-03 MED ORDER — CEPHALEXIN 500 MG CAP
500 mg | ORAL_CAPSULE | Freq: Two times a day (BID) | ORAL | 0 refills | Status: AC
Start: 2015-08-03 — End: 2015-08-09

## 2015-08-03 MED ORDER — LIDOCAINE (PF) 10 MG/ML (1 %) IJ SOLN
10 mg/mL (1 %) | INTRAMUSCULAR | Status: AC
Start: 2015-08-03 — End: 2015-08-02
  Administered 2015-08-03: 03:00:00 via INTRAMUSCULAR

## 2015-08-03 MED FILL — CEFTRIAXONE 250 MG SOLUTION FOR INJECTION: 250 mg | INTRAMUSCULAR | Qty: 250

## 2015-08-03 MED FILL — AZITHROMYCIN 250 MG TAB: 250 mg | ORAL | Qty: 4

## 2015-08-04 LAB — CHLAMYDIA/NEISSERIA AMPLIFICATION
Chlamydia amplification: NEGATIVE
N. gonorrhoeae amplification: NEGATIVE

## 2015-12-11 ENCOUNTER — Inpatient Hospital Stay: Payer: MEDICAID

## 2015-12-11 LAB — POC URINE MACROSCOPIC
Bilirubin (POC): NEGATIVE
Blood (POC): NEGATIVE
Glucose, urine (POC): NEGATIVE mg/dL
Ketones (POC): NEGATIVE mg/dL
Leukocyte esterase (POC): NEGATIVE
Nitrite (POC): NEGATIVE
Spec. gravity (POC): 1.025 — ABNORMAL HIGH (ref 1.001–1.023)
Urobilinogen (POC): 0.2 EU/dL (ref 0.2–1.0)
pH, urine  (POC): 6.5 (ref 5.0–9.0)

## 2015-12-11 MED ORDER — LACTATED RINGERS IV
Freq: Once | INTRAVENOUS | Status: DC
Start: 2015-12-11 — End: 2015-12-12

## 2015-12-11 MED FILL — LACTATED RINGERS IV: INTRAVENOUS | Qty: 1000

## 2015-12-11 NOTE — Progress Notes (Addendum)
1915: Assumed care of patient resting in bed. VS are stable. TOCO and EFM in place. New orders received from Dr. Mariah Milling to start IV fluids. In route to hospital for assessment    1924: @ bedside during decel. IV fluids started. Patient turned to right side.     2046: Mild contrations noted every 2-7 mins. Notified Dr. Mariah Milling. ETA 15-20 mins    2115: Dr. Mariah Milling @ bedside for pelvic exam. G/C and wet prep collected. SVE: closed. Patient being transferred to [facility]    2230: IM betamethasone given    2253: Report given to Leretha Pol, RN @ [facility]    2255: Patient off unit with medic in stable condition

## 2015-12-11 NOTE — Progress Notes (Signed)
Dr Mariah Milling bedside. Plan to transfer to [facility].

## 2015-12-11 NOTE — Progress Notes (Addendum)
Pt arrived from ED for c/o pelvic pain and lower left back side pain, intermittent with movement at work, but also at night while laying down, and spotting that started yesterday, scant, dark red. Denies coitus, denies exam or office visit since July, pt of [facility]. denies LOF, +FM, abdomen non tender and soft. EFM/TOCO applied. Pt is requesting ultrasound.       1855-SVE 0.5/thick/floating, no blood visible on sterile glove.

## 2015-12-11 NOTE — Progress Notes (Signed)
TRANSFER - OUT REPORT:    Verbal report given to [employer], RN(name) on [employer]  being transferred to [facility](unit) for urgent transfer       Report consisted of patient???s Situation, Background, Assessment and   Recommendations(SBAR).     Information from the following report(s) SBAR, Kardex and MAR was reviewed with the receiving nurse.    Lines:   Peripheral IV 12/11/15 Right Antecubital (Active)        Opportunity for questions and clarification was provided.      Patient transported with:  [facility] medical transport

## 2015-12-11 NOTE — H&P (Signed)
History & Physical    Name: Linda [name] MRN: [id number]  SSN: [id number]    Date of Birth: 1990-04-19  Age: 25 y.o.  Sex: female        Subjective:     Estimated Date of Delivery: 03/18/16  OB History     Gravida Para Term Preterm AB Living    6 3 3  2 3     SAB TAB Ectopic Molar Multiple Live Births    1 1   0 3          Linda [name] presents for evaluation of pregnancy at [redacted]w[redacted]d for back pain, notes that she lifted something heavy at work, started having back pain afterward, 8/10 pain, located in her lower back, constant, started last week. Also having abdominal pain, notes a lot of pressure in her pelvic region that extends to her sides, not ctxs, spotted yesterday, sometimes notes it in the afternoon, worse when she walks. Last spotted this am, no more today, not after a BM, no dysuria, frequency or urge. Sometimes she also has sharp, shooting pains in her vaginal area. Last had sex 1 month ago. Prenatal course was complicated by hypothyroidism. Please see prenatal records for details.    Past Medical History:   Diagnosis Date   ??? ADHD (attention deficit hyperactivity disorder)    ??? Bipolar disorder (HCC)    ??? BV (bacterial vaginosis)     On Several Occasions   ??? Chlamydia 04/25/2011    07/07/14, 04/12/14, 02/14/14, 07/03/12, 04/25/11    ??? Depression    ??? Gestational diabetes    ??? Gonorrhea 01/27/2013    07/07/14, 02/14/14, 01/27/13   ??? Headache 03/10/2014    CT Head W/O Contrast: Normal   ??? Hypokalemia 05/22/2011    3.4 mmol/L   ??? Hypomagnesemia 03/09/2014    1.5 mg/dL   ??? Hyponatremia 01/03/2011    132 mmol/L   ??? Hypothyroidism    ??? Microcytic anemia 01/01/2008   ??? Morbid obesity (HCC)    ??? OCD (obsessive compulsive disorder)    ??? Recurrent UTI    ??? Sacroiliitis (HCC) 11/12/2011    Noted on Lumbar X-ray: Left Sided Sacroliitis    ??? Strep throat 05/22/2011    + RS   ??? Vision decreased      Past Surgical History:   Procedure Laterality Date   ??? HX DILATION AND CURETTAGE  09/2013     Allergies   Allergen Reactions    ??? Percocet [Oxycodone-Acetaminophen] Nausea and Vomiting     Prior to Admission medications    Medication Sig Start Date End Date Taking? Authorizing Provider                                   levothyroxine (SYNTHROID) 75 mcg tablet Take 1 Tab by mouth Daily (before breakfast). Indications: HYPOTHYROIDISM 07/29/14   Aundra Dubin., MD   prenatal multivit-ca-min-fe-fa (PRENATAL VITAMIN) tab Take 1 Tab by mouth daily. 07/05/14   Emerson Monte, CNM      Social History     Occupational History   ??? Not on file.     Social History Main Topics   ??? Smoking status: Never Smoker   ??? Smokeless tobacco: Never Used   ??? Alcohol use No   ??? Drug use: No   ??? Sexual activity: Yes     Partners: Male     Pharmacist, hospital  protection: None     Family History   Problem Relation Age of Onset   ??? Hypertension Maternal Grandmother    ??? Thyroid Disease Maternal Grandmother    ??? Diabetes Maternal Grandmother    ??? Hypertension Maternal Grandfather    ??? Hypertension Paternal Grandfather    ??? Hypertension Mother    ??? Asthma Mother    ??? Diabetes Mother    ??? Thyroid Disease Brother    ??? Diabetes Brother    ??? No Known Problems Sister    ??? Diabetes Father    ??? Diabetes Paternal Grandmother    ??? Thyroid Disease Maternal Aunt        Review of Systems: Pertinent items are noted in HPI.    Objective:     Vitals:  Vitals:    12/11/15 1932 12/11/15 2002 12/11/15 2027 12/11/15 2032   BP: 131/56 (!) 121/96 139/72 133/47   Pulse: 68 79 68 74   Resp:       Temp:       Weight:       Height:            Physical Exam:   Patient without distress.  Heart: Regular rate and rhythm  Lung: clear to auscultation throughout lung fields, no wheezes, no rales, no rhonchi and normal respiratory effort  Back: costovertebral angle tenderness absent, notes pain in her lower abdomen  Abdomen: soft, nontender  Fundus: soft and non tender   Pelvic:  EFGWNL, moderate amount of white, thin D/C, no blood noted  Perineum: blood absent, amniotic fluid absent   Cervical Exam: Closed/Thick/High  Membranes:  Intact  Fetal Heart Rate: Baseline: 145 per minute  Variability: moderate  Accelerations: no  Decelerations: several intermittent variable decelerations, the first 2 for 40 seconds to nadir of 90, another to nadir of 120 for approx 1 minute, approx 50 minutes later, decel to 120 for 40 secs  Uterine contractions: irregular, every 2-7 minutes--> resolved    Prenatal Labs:   Lab Results   Component Value Date/Time    Rubella, External Immune 06/02/2014    GrBStrep, External positive 08/27/2011    HBsAg, External Negative 06/02/2014    HIV, External negative 08/03/2012    RPR, External NR 06/02/2014    Gonorrhea, External negative 08/10/2014    Chlamydia, External negative 08/10/2014          Recent Results (from the past 24 hour(s))   POC URINE MACROSCOPIC    Collection Time: 12/11/15  6:59 PM   Result Value Ref Range    Color YELLOW      Appearance CLEAR      Spec. gravity (POC) 1.025 (H) 1.001 - 1.023      pH, urine  (POC) 6.5 5.0 - 9.0      Protein (POC) TRACE (A) NEG mg/dL    Glucose, urine (POC) NEGATIVE  NEG mg/dL    Ketones (POC) NEGATIVE  NEG mg/dL    Bilirubin (POC) NEGATIVE  NEG      Blood (POC) NEGATIVE  NEG      Urobilinogen (POC) 0.2 0.2 - 1.0 EU/dL    Nitrite (POC) NEGATIVE  NEG      Leukocyte esterase (POC) NEGATIVE  NEG      Performed by Chancy HurterKaivon Haynes        Assessment/Plan:     Plan: 696w0d   Preterm contractions--> No evidence of labor   Fetal decelerations--> Off/on decelerations, discussed with Dr. Jayme CloudWarsof who accepts transfer of patient  Reassuring fetal status   [facility]    Signed By:  Etta Grandchildenee C Neilah Fulwider, MD     December 11, 2015 9:17 PM

## 2015-12-12 LAB — WET PREP
Wet prep: NONE SEEN
Wet prep: NONE SEEN

## 2015-12-12 MED ORDER — SODIUM CHLORIDE 0.9 % IV PIGGY BACK
2 gram | INTRAVENOUS | Status: DC
Start: 2015-12-12 — End: 2015-12-11

## 2015-12-12 MED ORDER — SUCRALFATE 1 GRAM TAB
1 gram | ORAL | Status: DC
Start: 2015-12-12 — End: 2015-12-11

## 2015-12-12 MED ORDER — BETAMETHASONE ACET & SOD PHOS 6 MG/ML SUSP FOR INJECTION
6 mg/mL | Freq: Once | INTRAMUSCULAR | Status: AC
Start: 2015-12-12 — End: 2015-12-11
  Administered 2015-12-12: 02:00:00 via INTRAMUSCULAR

## 2015-12-12 MED ORDER — INDOMETHACIN 25 MG CAP
25 mg | Freq: Once | ORAL | Status: DC
Start: 2015-12-12 — End: 2015-12-11

## 2015-12-12 MED FILL — SUCRALFATE 1 GRAM TAB: 1 gram | ORAL | Qty: 1

## 2015-12-12 MED FILL — BETAMETHASONE ACET & SOD PHOS 6 MG/ML SUSP FOR INJECTION: 6 mg/mL | INTRAMUSCULAR | Qty: 1

## 2015-12-13 LAB — CHLAMYDIA/NEISSERIA AMPLIFICATION
Chlamydia amplification: NEGATIVE
N. gonorrhoeae amplification: NEGATIVE

## 2015-12-27 ENCOUNTER — Inpatient Hospital Stay: Payer: MEDICAID

## 2015-12-27 LAB — POC URINE MACROSCOPIC
Bilirubin (POC): NEGATIVE
Blood (POC): NEGATIVE
Glucose, urine (POC): NEGATIVE mg/dL
Leukocyte esterase (POC): NEGATIVE
Nitrite (POC): NEGATIVE
Protein (POC): NEGATIVE mg/dL
Spec. gravity (POC): 1.025 — ABNORMAL HIGH (ref 1.001–1.023)
Urobilinogen (POC): 1 EU/dL (ref 0.2–1.0)
pH, urine  (POC): 7 (ref 5.0–9.0)

## 2015-12-27 NOTE — H&P (Signed)
High Risk Obstetrics Progress Note    Name: Linda [name] MRN: [id number]  SSN: [id number]    Date of Birth: 1990/10/19  Age: 25 y.o.  Sex: female      Subjective:      LOS: 0 days    Estimated Date of Delivery: 03/18/16   Gestational Age Today: 2972w2d     Patient admitted for lower abdominal and back pains.. States she does have mild abdominal pain, mild contractions and mild pelvic pressure and does not have vaginal bleeding  and vaginal leaking of fluid .    Objective:     Vitals:  Blood pressure 136/72, pulse (!) 112, temperature 98.8 ??F (37.1 ??C), resp. rate 18, height 5\' 5"  (1.651 m), weight 280 lb (127 kg), unknown if currently breastfeeding.   Temp (24hrs), Avg:98.8 ??F (37.1 ??C), Min:98.8 ??F (37.1 ??C), Max:98.8 ??F (37.1 ??C)    Systolic (24hrs), Avg:136 , Min:136 , Max:136      Diastolic (24hrs), Avg:72, Min:72, Max:72       Intake and Output:          Physical Exam:  Patient without distress.  Back: costovertebral angle tenderness absent  Abdomen: soft, nontender  Fundus: soft and non tender  Perineum: blood absent, amniotic fluid absent  Cervical Exam: Closed/Thick/High       Membranes:  Intact    Uterine Activity:  No contractions palpable     Fetal Heart Rate:  Baseline: 150 per minute        Labs:   Recent Results (from the past 36 hour(s))   POC URINE MACROSCOPIC    Collection Time: 12/27/15  8:00 PM   Result Value Ref Range    Color OTHER      Appearance CLOUDY      Spec. gravity (POC) 1.025 (H) 1.001 - 1.023      pH, urine  (POC) 7.0 5.0 - 9.0      Protein (POC) NEGATIVE  NEG mg/dL    Glucose, urine (POC) NEGATIVE  NEG mg/dL    Ketones (POC) TRACE (A) NEG mg/dL    Bilirubin (POC) NEGATIVE  NEG      Blood (POC) NEGATIVE  NEG      Urobilinogen (POC) 1.0 0.2 - 1.0 EU/dL    Nitrite (POC) NEGATIVE  NEG      Leukocyte esterase (POC) NEGATIVE  NEG      Performed by Darien RamusBarbara Felton        Assessment and Plan:      Active Problems:    Pregnancy (03/12/2013)     28+ weeks pregnancy not in labor.   Mild dehydration.  Reactive Fetal monitoring and reactive NST.    28+ weeks IUP with lower abdominal pains. Reactive NST.    Signed By: Basilia Jumbolugbenga S Caellum Mancil, MD     December 27, 2015

## 2015-12-27 NOTE — Other (Addendum)
1931: Rc'd G6P3 2421w2d to room 2220 for lower abdominal pain and pain in buttocks. Abdomen semi soft to palpation and nontender to touch. EFM and TOCO applied. Admits positive fetal and light red spotting around 12:30pm today, none since. Denies LOF and unusual vaginal discharge. Hx of hypothyroidism on synthroid 100mcg daily. Last ultrasound showed polyhydramnios. SVE 1cm and no blood noted on glove. Urine collected.    1957: Called Dr. Johny Chessredein regarding pt arrival, complaint,SVE, contraction pattern, U/A, hx. Rc'd orders for IV fluid bolus and tylenol. Recheck pt after hydration and call Dr. Johny Chessredein back.    2008: Pt updated on POC. Pt verbalized understanding. No further questions.    2059: EFM adjusted.   2110: Pt reports active fetal movement. EFM adjusted.  2120: SVE unchanged.  2125: Reactive NST concurred with Velda ShellE Glenn RN.  2129: Called Dr. Johny Chessredein regarding pt status, SVE, NST, contraction pattern, and pain. Rc'd order to d/c pt home.  2150: Pt given verbal and written discharge instructions. Pt verbalized understanding. No further questions.  2153: Pt ambulated off unit in stable condition with discharge instructions in hand. Pt in no apparent distress.

## 2015-12-28 MED ORDER — LACTATED RINGERS BOLUS IV
Freq: Once | INTRAVENOUS | Status: AC
Start: 2015-12-28 — End: 2015-12-27
  Administered 2015-12-28: via INTRAVENOUS

## 2015-12-28 MED ORDER — ACETAMINOPHEN 325 MG TABLET
325 mg | Freq: Once | ORAL | Status: AC
Start: 2015-12-28 — End: 2015-12-27
  Administered 2015-12-28: via ORAL

## 2015-12-28 MED FILL — TYLENOL 325 MG TABLET: 325 mg | ORAL | Qty: 2

## 2015-12-28 MED FILL — LACTATED RINGERS IV: INTRAVENOUS | Qty: 1000

## 2016-02-10 ENCOUNTER — Inpatient Hospital Stay: Payer: MEDICAID

## 2016-02-10 LAB — RUPTURE OF FETAL MEMBRANES, POC
Internal neg. control: NEGATIVE
Kit Lot No.: 38179
Rupture of fetal membrane: NEGATIVE

## 2016-02-10 LAB — POC URINE MACROSCOPIC
Bilirubin (POC): NEGATIVE
Blood (POC): NEGATIVE
Glucose, urine (POC): NEGATIVE mg/dL
Ketones (POC): >160 mg/dL — AB
Leukocyte esterase (POC): NEGATIVE
Nitrite (POC): POSITIVE — AB
Protein (POC): 30 mg/dL — AB
Spec. gravity (POC): 1.025 — ABNORMAL HIGH (ref 1.001–1.023)
Urobilinogen (POC): 0.2 EU/dL (ref 0.2–1.0)
pH, urine  (POC): 7 (ref 5.0–9.0)

## 2016-02-10 MED ORDER — DEXTROSE 5%-LACTATED RINGERS IV
Freq: Once | INTRAVENOUS | Status: AC
Start: 2016-02-10 — End: 2016-02-10
  Administered 2016-02-11: via INTRAVENOUS

## 2016-02-10 MED ORDER — LACTATED RINGERS BOLUS IV
Freq: Once | INTRAVENOUS | Status: AC
Start: 2016-02-10 — End: 2016-02-10
  Administered 2016-02-11: 02:00:00 via INTRAVENOUS

## 2016-02-10 NOTE — Progress Notes (Addendum)
Verbal bedside shift report done with Dena BilletAmanda Horton RN [facility]    1926 Assumed care of patient in bed family@ bedside denies pain or discomfort no contractions seen on monitor. Patient tilted to left side.    1930 Call placed to Dr Johny Chessredein informing of Nitrates positive in urine orders receive for antibiotics    2050 Patient states she feels better no more nausea and the pain and pressure has gone from a 9 to now a five. Dr Johny Chessredein notified of patients current status okay to discharge to home [facility]     Patient also given instructions for labor percautions and UTI in pregnancy patient verbalized understanding of teaching.    2125 Patient ambulated off the unit in  No distress

## 2016-02-10 NOTE — Progress Notes (Addendum)
1815: Patient arrives for abdominal, pelvis and side pain that she has had since yesterday. States that pain is sharp and has been constant since yesterday. Has tried tylenol but did not help. Denies any contractions or vaginal bleeding is not sure if she is leaking vaginal fluid, states she has has very small amounts of fluid leaking but is not sure if its urine. TOCO and EMF applied with positive FHT. Actimprom was negative, SVE patient about 1 cm. Patient is a [facility] patient for her hypothyroidism.    1845: Dr Johny Chessredein notified orders received to give 2 bags of fluid and recheck urine. Call with update.

## 2016-02-10 NOTE — H&P (Signed)
High Risk Obstetrics Progress Note    Name: Linda [name] MRN: [id number]  SSN: [id number]    Date of Birth: 24-May-1990  Age: 25 y.o.  Sex: female      Subjective:      LOS: 0 days    Estimated Date of Delivery: 03/18/16   Gestational Age Today: [redacted]w[redacted]d    Patient admitted for evaluation of lower abdominal pain lowe back pains. States she does have mild abdominal pain, mild contractions and moderate nausea/vomiting and does not have vaginal bleeding  and vaginal leaking of fluid .    Objective:     Vitals:  Blood pressure 135/64, pulse (!) 101, temperature 98.8 ??F (37.1 ??C), height _0  (1.651 m), weight 315 lb (142.9 kg), unknown if currently breastfeeding.   No data recorded.    No data recorded.     No data recorded.       Intake and Output:          Physical Exam:  Patient without distress.  Back: costovertebral angle tenderness present  Abdomen: soft, nontender  Perineum: blood absent, amniotic fluid absent  Cervical Exam: Closed/Thick/High       Membranes:  Intact    Uterine Activity:  Frequency: irregular    Fetal Heart Rate:  Reactive  Baseline: 150 per minute  Accelerations: yes        Labs:   Recent Results (from the past 36 hour(s))   RUPTURE OF FETAL MEMBRANES, POC    Collection Time: 02/10/16  6:31 PM   Result Value Ref Range    Rupture of fetal membrane Negative Negative    Control line present? Yes     Internal neg. control Negative     Kit [place] No. [id number]     Expiration date 08/17/17    POC URINE MACROSCOPIC    Collection Time: 02/10/16  7:51 PM   Result Value Ref Range    Color OTHER      Appearance CLOUDY      Spec. gravity (POC) 1.025 (H) 1.001 - 1.023      pH, urine  (POC) 7.0 5.0 - 9.0      Protein (POC) 30 (A) NEG mg/dL    Glucose, urine (POC) NEGATIVE  NEG mg/dL    Ketones (POC) >160 (A) NEG mg/dL    Bilirubin (POC) NEGATIVE  NEG      Blood (POC) NEGATIVE  NEG      Urobilinogen (POC) 0.2 0.2 - 1.0 EU/dL    Nitrite (POC) POSITIVE (A) NEG      Leukocyte esterase (POC) NEGATIVE  NEG       Performed by FAzalia Bilis       Assessment and Plan:      Active Problems:    Abdominal complaints (02/10/2016)       34+ weeks with Mild UTI and Dehydration. Reactive NST.   Dehydration--treated with IV fluids, and Mild UTI treated with IV Rocephin 500 mg.    Signed By: OGilles Chiquito MD     February 11, 2016

## 2016-02-11 MED ORDER — ACETAMINOPHEN 500 MG TAB
500 mg | ORAL | Status: AC
Start: 2016-02-11 — End: 2016-02-10
  Administered 2016-02-11: 02:00:00 via ORAL

## 2016-02-11 MED ORDER — ONDANSETRON (PF) 4 MG/2 ML INJECTION
4 mg/2 mL | Freq: Once | INTRAMUSCULAR | Status: AC
Start: 2016-02-11 — End: 2016-02-10

## 2016-02-11 MED ORDER — SODIUM CHLORIDE 0.9 % IV PIGGY BACK
500 mg | Freq: Once | INTRAVENOUS | Status: AC
Start: 2016-02-11 — End: 2016-02-10
  Administered 2016-02-11: 01:00:00 via INTRAVENOUS

## 2016-02-11 MED ORDER — ONDANSETRON (PF) 4 MG/2 ML INJECTION
4 mg/2 mL | INTRAMUSCULAR | Status: AC
Start: 2016-02-11 — End: 2016-02-10
  Administered 2016-02-11: 02:00:00 via INTRAVENOUS

## 2016-02-11 MED ORDER — ACETAMINOPHEN 80 MG CHEWABLE TAB
80 mg | ORAL | Status: DC | PRN
Start: 2016-02-11 — End: 2016-02-10

## 2016-02-11 MED ORDER — SODIUM CHLORIDE 0.9 % IV
INTRAVENOUS | Status: DC
Start: 2016-02-11 — End: 2016-02-11

## 2016-02-11 MED FILL — LACTATED RINGERS IV: INTRAVENOUS | Qty: 1000

## 2016-02-11 MED FILL — ONDANSETRON (PF) 4 MG/2 ML INJECTION: 4 mg/2 mL | INTRAMUSCULAR | Qty: 2

## 2016-02-11 MED FILL — TYLENOL EXTRA STRENGTH 500 MG TABLET: 500 mg | ORAL | Qty: 2

## 2016-02-11 MED FILL — DEXTROSE 5%-LACTATED RINGERS IV: INTRAVENOUS | Qty: 1000

## 2016-02-11 MED FILL — SODIUM CHLORIDE 0.9 % IV: INTRAVENOUS | Qty: 1000

## 2016-02-11 MED FILL — CEFTRIAXONE 500 MG SOLUTION FOR INJECTION: 500 mg | INTRAMUSCULAR | Qty: 500

## 2017-08-04 ENCOUNTER — Inpatient Hospital Stay
Admit: 2017-08-04 | Discharge: 2017-08-06 | Disposition: A | Discharge status: Home / Self Care | Payer: MEDICAID | Attending: Psychiatry | Admitting: Psychiatry

## 2017-08-04 ENCOUNTER — Inpatient Hospital Stay

## 2017-08-04 DIAGNOSIS — F3289 Other specified depressive episodes: Principal | ICD-10-CM

## 2017-08-04 LAB — CBC WITH AUTOMATED DIFF
ABS. BASOPHILS: 0 10*3/uL (ref 0.0–0.1)
ABS. EOSINOPHILS: 0 10*3/uL (ref 0.0–0.4)
ABS. LYMPHOCYTES: 2.6 10*3/uL (ref 0.9–3.6)
ABS. MONOCYTES: 0.4 10*3/uL (ref 0.05–1.2)
ABS. NEUTROPHILS: 4.3 10*3/uL (ref 1.8–8.0)
BASOPHILS: 0 % (ref 0–2)
EOSINOPHILS: 0 % (ref 0–5)
HCT: 36.3 % (ref 35.0–45.0)
HGB: 12.6 g/dL (ref 12.0–16.0)
LYMPHOCYTES: 35 % (ref 21–52)
MCH: 25.1 PG (ref 24.0–34.0)
MCHC: 34.7 g/dL (ref 31.0–37.0)
MCV: 72.5 FL — ABNORMAL LOW (ref 74.0–97.0)
MONOCYTES: 6 % (ref 3–10)
MPV: 9.2 FL (ref 9.2–11.8)
NEUTROPHILS: 59 % (ref 40–73)
PLATELET: 236 10*3/uL (ref 135–420)
RBC: 5.01 M/uL (ref 4.20–5.30)
RDW: 15.4 % — ABNORMAL HIGH (ref 11.6–14.5)
WBC: 7.3 10*3/uL (ref 4.6–13.2)

## 2017-08-04 LAB — METABOLIC PANEL, COMPREHENSIVE
A-G Ratio: 1 (ref 0.8–1.7)
ALT (SGPT): 21 U/L (ref 13–56)
AST (SGOT): 16 U/L (ref 15–37)
Albumin: 4 g/dL (ref 3.4–5.0)
Alk. phosphatase: 75 U/L (ref 45–117)
Anion gap: 8 mmol/L (ref 3.0–18)
BUN/Creatinine ratio: 15 (ref 12–20)
BUN: 11 MG/DL (ref 7.0–18)
Bilirubin, total: 0.3 MG/DL (ref 0.2–1.0)
CO2: 23 mmol/L (ref 21–32)
Calcium: 9.6 MG/DL (ref 8.5–10.1)
Chloride: 109 mmol/L — ABNORMAL HIGH (ref 100–108)
Creatinine: 0.72 MG/DL (ref 0.6–1.3)
GFR est AA: >60 mL/min/{1.73_m2} (ref >60)
GFR est non-AA: >60 mL/min/{1.73_m2} (ref >60)
Globulin: 3.9 g/dL (ref 2.0–4.0)
Glucose: 121 mg/dL — ABNORMAL HIGH (ref 74–99)
Potassium: 3.1 mmol/L — ABNORMAL LOW (ref 3.5–5.5)
Protein, total: 7.9 g/dL (ref 6.4–8.2)
Sodium: 140 mmol/L (ref 136–145)

## 2017-08-04 LAB — ETHYL ALCOHOL: ALCOHOL(ETHYL),SERUM: <3 MG/DL (ref 0–3)

## 2017-08-04 NOTE — ED Notes (Signed)
Bedside report received from Brittney RN.

## 2017-08-04 NOTE — ED Provider Notes (Signed)
ER08/08    26 y.o. BLACK OR AFRICAN AMERICAN female    Presents to the ED with   Chief Complaint   Patient presents with   ??? Suicidal         HPI: 6:31 PM    This is a 27 year old female presents to the emergency department for evaluation of depression, and suicidal ideation.  The patient has history of bipolar disorder.  The only medication she is currently taking his trazodone.  She states she supposed to be on other medicines, but has been off them for several years.  She was released from [place] city jail at 230 this afternoon, she had no place to go, and so she came to the emergency department.  Her mother lives in [place].  Her grandmother lives locally, however she states she cannot stay with her grandmother because she "burned her bridges".  Symptoms are constant, moderate, with no other relieving or exacerbating factors    ROS:  14 organ system review of systems is complete and is negative except as addressed in the HPI        Social History:   Social History     Socioeconomic History   ??? Marital status: SINGLE     Spouse name: Not on file   ??? Number of children: Not on file   ??? Years of education: Not on file   ??? Highest education level: Not on file   Occupational History   ??? Not on file   Social Needs   ??? Financial resource strain: Not on file   ??? Food insecurity:     Worry: Not on file     Inability: Not on file   ??? Transportation needs:     Medical: Not on file     Non-medical: Not on file   Tobacco Use   ??? Smoking status: Never Smoker   ??? Smokeless tobacco: Never Used   Substance and Sexual Activity   ??? Alcohol use: Yes   ??? Drug use: Yes     Types: Marijuana   ??? Sexual activity: Yes     Partners: Male     Birth control/protection: None   Lifestyle   ??? Physical activity:     Days per week: Not on file     Minutes per session: Not on file   ??? Stress: Not on file   Relationships   ??? Social connections:     Talks on phone: Not on file     Gets together: Not on file      Attends religious service: Not on file     Active member of club or organization: Not on file     Attends meetings of clubs or organizations: Not on file     Relationship status: Not on file   ??? Intimate partner violence:     Fear of current or ex partner: Not on file     Emotionally abused: Not on file     Physically abused: Not on file     Forced sexual activity: Not on file   Other Topics Concern   ??? Not on file   Social History Narrative   ??? Not on file      reports that she has never smoked. She has never used smokeless tobacco.    Family History:   Family History   Problem Relation Age of Onset   ??? Hypertension Maternal Grandmother    ??? Thyroid Disease Maternal Grandmother    ??? Diabetes  Maternal Grandmother    ??? Hypertension Maternal Grandfather    ??? Hypertension Paternal Grandfather    ??? Hypertension Mother    ??? Asthma Mother    ??? Diabetes Mother    ??? Thyroid Disease Brother    ??? Diabetes Brother    ??? No Known Problems Sister    ??? Diabetes Father    ??? Diabetes Paternal Grandmother    ??? Thyroid Disease Maternal Aunt        Past Medical History:   Past Medical History:   Diagnosis Date   ??? ADHD (attention deficit hyperactivity disorder)    ??? Bipolar disorder (HCC)    ??? BV (bacterial vaginosis)     On Several Occasions   ??? Chlamydia 04/25/2011    07/07/14, 04/12/14, 02/14/14, 07/03/12, 04/25/11    ??? Depression    ??? Gestational diabetes    ??? Gonorrhea 01/27/2013    07/07/14, 02/14/14, 01/27/13   ??? Headache 03/10/2014    CT Head W/O Contrast: Normal   ??? Hypokalemia 05/22/2011    3.4 mmol/L   ??? Hypomagnesemia 03/09/2014    1.5 mg/dL   ??? Hyponatremia 01/03/2011    132 mmol/L   ??? Hypothyroidism    ??? Microcytic anemia 01/01/2008   ??? Morbid obesity (HCC)    ??? OCD (obsessive compulsive disorder)    ??? Recurrent UTI    ??? Sacroiliitis (HCC) 11/12/2011    Noted on Lumbar X-ray: Left Sided Sacroliitis    ??? Strep throat 05/22/2011    + RS   ??? Vision decreased          Past Surgical History:   Past Surgical History:    Procedure Laterality Date   ??? HX DILATION AND CURETTAGE  09/2013       Primary Care: None    Immunizations:     Medications: No current facility-administered medications for this encounter.     Current Outpatient Medications:   ???  traZODone (DESYREL) 100 mg tablet, Take 200 mg by mouth nightly., Disp: , Rfl:   ???  erythromycin (ILOTYCIN) ophthalmic ointment, Apply OD 6 times per day x 10 days. Apply 1 cm ribbon to the lower conjunctival sac., Disp: 1 Tube, Rfl: 0  ???  HYDROcodone-acetaminophen (NORCO) 5-325 mg per tablet, Take 1 Tab by mouth every four (4) hours as needed (BREAKTHROUGH PAIN ONLY, DO NOT FILL UNLESS KEFLEX & ERYTHROMYCIN OINTMENT ARE FILLED.). Max Daily Amount: 6 Tabs., Disp: 12 Tab, Rfl: 0  ???  ferrous sulfate 325 mg (65 mg iron) tablet, Take 1 Tab by mouth three (3) times daily (with meals). Indications: IRON DEFICIENCY ANEMIA, Disp: 90 Tab, Rfl: 3  ???  ibuprofen (MOTRIN) 800 mg tablet, Take 1 Tab by mouth every eight (8) hours as needed. Indications: PAIN, Disp: 40 Tab, Rfl: 2  ???  levothyroxine (SYNTHROID) 75 mcg tablet, Take 1 Tab by mouth Daily (before breakfast). Indications: HYPOTHYROIDISM (Patient taking differently: Take 100 mcg by mouth Daily (before breakfast). Indications: hypothyroidism), Disp: 30 Tab, Rfl: 2  ???  prenatal multivit-ca-min-fe-fa (PRENATAL VITAMIN) tab, Take 1 Tab by mouth daily., Disp: 30 Tab, Rfl: 5    Allergies:   Allergies   Allergen Reactions   ??? Percocet [Oxycodone-Acetaminophen] Nausea and Vomiting         Last Cath       Last Stress Test       Prior:ECHO               Physical Exam:  .  Patient Vitals for the past 12 hrs:   Pulse Resp BP SpO2   08/04/17 1844 62 14 123/49 98 %     Gen: Well developed, well nourished 27 y.o. female  HEENT: Normocephalic, atraumatic.  No scleral icterus.  Extraocular movements intact..  Normal mucous membranes. Uvula midline. Airway widely patent.  Respiratory: No accessory muscle use. No wheeze, No rales, No rhonchi.  Normal chest wall excursion. No subcutaneous air, no rib crepitus  Cardiovascular: Regular rhythm and rate, Normal pulses, Normal perfusion. No edema.  Gastrointestinal: Non distended, Non tender, No masses. No ascites. No organomegaly. No evidence of trauma  Musculoskeletal: Full range of motion at all other tested joints. No joint effusions.  Neurological: Normal strength, Normal sensation. Normal speech. No ataxia. Cranial nerves II-XII normal as tested.  Skin: No rash, petechia or purpura. Warm and dry  Psychiatric: Positive for suicidal ideation, No homicidal ideation. No hallucinations. Organized thoughts. Normal mood. normal affect.  Heme: No lymphadenopathy.   GU: Deferred    Orders:   Orders Placed This Encounter   ??? URINALYSIS W/ RFLX MICROSCOPIC   ??? HCG URINE, QL   ??? METABOLIC PANEL, COMPREHENSIVE   ??? CBC WITH AUTOMATED DIFF   ??? DRUG SCREEN, URINE   ??? ETHYL ALCOHOL   ??? SITTER   ??? SUICIDE PRECAUTIONS   ??? traZODone (DESYREL) 100 mg tablet       ECG:   Current:      Comparison: .    Imaging:   No results found.    Labs:  Labs Reviewed   METABOLIC PANEL, COMPREHENSIVE - Abnormal; Notable for the following components:       Result Value    Potassium 3.1 (*)     Chloride 109 (*)     Glucose 121 (*)     All other components within normal limits   CBC WITH AUTOMATED DIFF - Abnormal; Notable for the following components:    MCV 72.5 (*)     RDW 15.4 (*)     All other components within normal limits   DRUG SCREEN, URINE - Abnormal; Notable for the following components:    THC (TH-CANNABINOL) POSITIVE (*)     All other components within normal limits   URINALYSIS W/ RFLX MICROSCOPIC   HCG URINE, QL   ETHYL ALCOHOL       EMERGENCY DEPARTMENT COURSE  9:12 PM    Behavior health was contacted to initiate an evaluation.  I am awaiting their further recommendations    Care endorsed to Dr. Lucas Mallow at 1900      Diagnosis: No diagnosis found.      Disposition:        Discharge Medications:    Current Discharge Medication List          Referral:     Follow-up Information    None            (This chart was created with dictation software and an EHR.  It may contain unintended unedited historical and or dictation errors)

## 2017-08-04 NOTE — ED Notes (Signed)
Pt was brought in by police with attempt [place]. Pt states that she has attempted suicide in the past

## 2017-08-04 NOTE — ED Notes (Signed)
Bedside shift change report given to Brittanie RN (oncoming nurse) by Brittney E Wicks, RN (offgoing nurse). Report included the following information SBAR.
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
## 2017-08-04 NOTE — ED Provider Notes (Signed)
ER08/08    26 y.o. BLACK OR AFRICAN AMERICAN female    Presents to the ED with   Chief Complaint   Patient presents with   ??? Suicidal         HPI: 6:31 PM    This is a 27 year old female presents to the emergency department for evaluation of depression, and suicidal ideation.  The patient has history of bipolar disorder.  The only medication she is currently taking his trazodone.  She states she supposed to be on other medicines, but has been off them for several years.  She was released from [place] [facility] at 230 this afternoon, she had no place to go, and so she came to the emergency department.  Her mother lives in [place].  Her grandmother lives locally, however she states she cannot stay with her grandmother because she "burned her bridges".  Symptoms are constant, moderate, with no other relieving or exacerbating factors    ROS:  14 organ system review of systems is complete and is negative except as addressed in the HPI        Social History:   Social History     Socioeconomic History   ??? Marital status: SINGLE     Spouse name: Not on file   ??? Number of children: Not on file   ??? Years of education: Not on file   ??? Highest education level: Not on file   Occupational History   ??? Not on file   Social Needs   ??? Financial resource strain: Not on file   ??? Food insecurity:     Worry: Not on file     Inability: Not on file   ??? Transportation needs:     Medical: Not on file     Non-medical: Not on file   Tobacco Use   ??? Smoking status: Never Smoker   ??? Smokeless tobacco: Never Used   Substance and Sexual Activity   ??? Alcohol use: Yes   ??? Drug use: Yes     Types: Marijuana   ??? Sexual activity: Yes     Partners: Male     Birth control/protection: None   Lifestyle   ??? Physical activity:     Days per week: Not on file     Minutes per session: Not on file   ??? Stress: Not on file   Relationships   ??? Social connections:     Talks on phone: Not on file     Gets together: Not on file     Attends religious service: Not on  file     Active member of club or organization: Not on file     Attends meetings of clubs or organizations: Not on file     Relationship status: Not on file   ??? Intimate partner violence:     Fear of current or ex partner: Not on file     Emotionally abused: Not on file     Physically abused: Not on file     Forced sexual activity: Not on file   Other Topics Concern   ??? Not on file   Social History Narrative   ??? Not on file      reports that she has never smoked. She has never used smokeless tobacco.    Family History:   Family History   Problem Relation Age of Onset   ??? Hypertension Maternal Grandmother    ??? Thyroid Disease Maternal Grandmother    ??? Diabetes  Maternal Grandmother    ??? Hypertension Maternal Grandfather    ??? Hypertension Paternal Grandfather    ??? Hypertension Mother    ??? Asthma Mother    ??? Diabetes Mother    ??? Thyroid Disease Brother    ??? Diabetes Brother    ??? No Known Problems Sister    ??? Diabetes Father    ??? Diabetes Paternal Grandmother    ??? Thyroid Disease Maternal Aunt        Past Medical History:   Past Medical History:   Diagnosis Date   ??? ADHD (attention deficit hyperactivity disorder)    ??? Bipolar disorder (HCC)    ??? BV (bacterial vaginosis)     On Several Occasions   ??? Chlamydia 04/25/2011    07/07/14, 04/12/14, 02/14/14, 07/03/12, 04/25/11    ??? Depression    ??? Gestational diabetes    ??? Gonorrhea 01/27/2013    07/07/14, 02/14/14, 01/27/13   ??? Headache 03/10/2014    CT Head W/O Contrast: Normal   ??? Hypokalemia 05/22/2011    3.4 mmol/L   ??? Hypomagnesemia 03/09/2014    1.5 mg/dL   ??? Hyponatremia 01/03/2011    132 mmol/L   ??? Hypothyroidism    ??? Microcytic anemia 01/01/2008   ??? Morbid obesity (HCC)    ??? OCD (obsessive compulsive disorder)    ??? Recurrent UTI    ??? Sacroiliitis (HCC) 11/12/2011    Noted on Lumbar X-ray: Left Sided Sacroliitis    ??? Strep throat 05/22/2011    + RS   ??? Vision decreased          Past Surgical History:   Past Surgical History:   Procedure Laterality Date   ??? HX DILATION AND  CURETTAGE  09/2013       Primary Care: None    Immunizations:     Medications: No current facility-administered medications for this encounter.     Current Outpatient Medications:   ???  traZODone (DESYREL) 100 mg tablet, Take 200 mg by mouth nightly., Disp: , Rfl:   ???  erythromycin (ILOTYCIN) ophthalmic ointment, Apply OD 6 times per day x 10 days. Apply 1 cm ribbon to the lower conjunctival sac., Disp: 1 Tube, Rfl: 0  ???  HYDROcodone-acetaminophen (NORCO) 5-325 mg per tablet, Take 1 Tab by mouth every four (4) hours as needed (BREAKTHROUGH PAIN ONLY, DO NOT FILL UNLESS KEFLEX & ERYTHROMYCIN OINTMENT ARE FILLED.). Max Daily Amount: 6 Tabs., Disp: 12 Tab, Rfl: 0  ???  ferrous sulfate 325 mg (65 mg iron) tablet, Take 1 Tab by mouth three (3) times daily (with meals). Indications: IRON DEFICIENCY ANEMIA, Disp: 90 Tab, Rfl: 3  ???  ibuprofen (MOTRIN) 800 mg tablet, Take 1 Tab by mouth every eight (8) hours as needed. Indications: PAIN, Disp: 40 Tab, Rfl: 2  ???  levothyroxine (SYNTHROID) 75 mcg tablet, Take 1 Tab by mouth Daily (before breakfast). Indications: HYPOTHYROIDISM (Patient taking differently: Take 100 mcg by mouth Daily (before breakfast). Indications: hypothyroidism), Disp: 30 Tab, Rfl: 2  ???  prenatal multivit-ca-min-fe-fa (PRENATAL VITAMIN) tab, Take 1 Tab by mouth daily., Disp: 30 Tab, Rfl: 5    Allergies:   Allergies   Allergen Reactions   ??? Percocet [Oxycodone-Acetaminophen] Nausea and Vomiting         Last Cath       Last Stress Test       Prior:ECHO               Physical Exam:  .  Patient Vitals for the past 12 hrs:   Pulse Resp BP SpO2   08/04/17 1844 62 14 123/49 98 %     Gen: Well developed, well nourished 27 y.o. female  HEENT: Normocephalic, atraumatic.  No scleral icterus.  Extraocular movements intact..  Normal mucous membranes. Uvula midline. Airway widely patent.  Respiratory: No accessory muscle use. No wheeze, No rales, No rhonchi. Normal chest wall excursion. No subcutaneous air, no rib  crepitus  Cardiovascular: Regular rhythm and rate, Normal pulses, Normal perfusion. No edema.  Gastrointestinal: Non distended, Non tender, No masses. No ascites. No organomegaly. No evidence of trauma  Musculoskeletal: Full range of motion at all other tested joints. No joint effusions.  Neurological: Normal strength, Normal sensation. Normal speech. No ataxia. Cranial nerves II-XII normal as tested.  Skin: No rash, petechia or purpura. Warm and dry  Psychiatric: Positive for suicidal ideation, No homicidal ideation. No hallucinations. Organized thoughts. Normal mood. normal affect.  Heme: No lymphadenopathy.   GU: Deferred    Orders:   Orders Placed This Encounter   ??? URINALYSIS W/ RFLX MICROSCOPIC   ??? HCG URINE, QL   ??? METABOLIC PANEL, COMPREHENSIVE   ??? CBC WITH AUTOMATED DIFF   ??? DRUG SCREEN, URINE   ??? ETHYL ALCOHOL   ??? SITTER   ??? SUICIDE PRECAUTIONS   ??? traZODone (DESYREL) 100 mg tablet       ECG:   Current:      Comparison: .    Imaging:   No results found.    Labs:  Labs Reviewed   METABOLIC PANEL, COMPREHENSIVE - Abnormal; Notable for the following components:       Result Value    Potassium 3.1 (*)     Chloride 109 (*)     Glucose 121 (*)     All other components within normal limits   CBC WITH AUTOMATED DIFF - Abnormal; Notable for the following components:    MCV 72.5 (*)     RDW 15.4 (*)     All other components within normal limits   DRUG SCREEN, URINE - Abnormal; Notable for the following components:    THC (TH-CANNABINOL) POSITIVE (*)     All other components within normal limits   URINALYSIS W/ RFLX MICROSCOPIC   HCG URINE, QL   ETHYL ALCOHOL       EMERGENCY DEPARTMENT COURSE  9:12 PM    Behavior health was contacted to initiate an evaluation.  I am awaiting their further recommendations    Care endorsed to Dr. Lucas Mallow at 1900      Diagnosis: No diagnosis found.      Disposition:        Discharge Medications:   Current Discharge Medication List            Referral:     Follow-up Information    None             (This chart was created with dictation software and an EHR.  It may contain unintended unedited historical and or dictation errors)

## 2017-08-05 LAB — DRUG SCREEN, URINE
AMPHETAMINES: NEGATIVE
BARBITURATES: NEGATIVE
BENZODIAZEPINES: NEGATIVE
COCAINE: NEGATIVE
METHADONE: NEGATIVE
OPIATES: NEGATIVE
PCP(PHENCYCLIDINE): NEGATIVE
THC (TH-CANNABINOL): POSITIVE — AB

## 2017-08-05 LAB — URINALYSIS W/ RFLX MICROSCOPIC
Bilirubin: NEGATIVE
Blood: NEGATIVE
Glucose: NEGATIVE mg/dL
Ketone: NEGATIVE mg/dL
Leukocyte Esterase: NEGATIVE
Nitrites: NEGATIVE
Protein: NEGATIVE mg/dL
Specific gravity: 1.03 (ref 1.005–1.030)
Urobilinogen: 1 EU/dL (ref 0.2–1.0)
pH (UA): 6 (ref 5.0–8.0)

## 2017-08-05 LAB — HCG URINE, QL: HCG urine, QL: NEGATIVE

## 2017-08-05 MED ORDER — DIPHENHYDRAMINE 25 MG CAP
25 mg | ORAL | Status: AC
Start: 2017-08-05 — End: 2017-08-05
  Administered 2017-08-05: 04:00:00 via ORAL

## 2017-08-05 MED ORDER — LEVOTHYROXINE 75 MCG TAB
75 mcg | ORAL | Status: AC
Start: 2017-08-05 — End: 2017-08-05
  Administered 2017-08-05: 17:00:00 via ORAL

## 2017-08-05 MED ORDER — LORAZEPAM 1 MG TAB
1 mg | ORAL | Status: DC | PRN
Start: 2017-08-05 — End: 2017-08-06

## 2017-08-05 MED ORDER — HALOPERIDOL 5 MG TAB
5 mg | ORAL | Status: DC | PRN
Start: 2017-08-05 — End: 2017-08-06

## 2017-08-05 MED ORDER — LEVOTHYROXINE 75 MCG TAB
75 mcg | ORAL | Status: DC
Start: 2017-08-05 — End: 2017-08-06
  Administered 2017-08-06: 10:00:00 via ORAL

## 2017-08-05 MED ORDER — LORAZEPAM 2 MG/ML IJ SOLN
2 mg/mL | INTRAMUSCULAR | Status: DC | PRN
Start: 2017-08-05 — End: 2017-08-06

## 2017-08-05 MED ORDER — HALOPERIDOL LACTATE 5 MG/ML IJ SOLN
5 mg/mL | INTRAMUSCULAR | Status: DC | PRN
Start: 2017-08-05 — End: 2017-08-06

## 2017-08-05 MED ORDER — TRAZODONE 100 MG TAB
100 mg | Freq: Every evening | ORAL | Status: DC
Start: 2017-08-05 — End: 2017-08-06
  Administered 2017-08-06: 02:00:00 via ORAL

## 2017-08-05 MED ORDER — IBUPROFEN 400 MG TAB
400 mg | ORAL | Status: DC | PRN
Start: 2017-08-05 — End: 2017-08-06
  Administered 2017-08-05 – 2017-08-06 (×2): via ORAL

## 2017-08-05 MED ORDER — HYDROXYZINE PAMOATE 50 MG CAP
50 mg | ORAL | Status: DC | PRN
Start: 2017-08-05 — End: 2017-08-06

## 2017-08-05 MED FILL — SYNTHROID 75 MCG TABLET: 75 mcg | ORAL | Qty: 1

## 2017-08-05 MED FILL — DIPHENHYDRAMINE 25 MG CAP: 25 mg | ORAL | Qty: 1

## 2017-08-05 MED FILL — IBUPROFEN 400 MG TAB: 400 mg | ORAL | Qty: 1
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
## 2017-08-05 NOTE — ED Notes (Signed)
2100 AM :Pt care assumed from Dr. Dates , ED provider. Pt complaint(s), current treatment plan, progression and available diagnostic results have been discussed thoroughly.  Rounding occurred: yes  Intended Disposition: TBD   Pending diagnostic reports and/or labs (please list): crisis eval      The patient has remained stable while under my care after signout. The patient has been resting comfortably, is in NAD and their vitals have remained stable.  Patient has been seen by crisis, who does feel she meets inpatient criteria.  Patient will voluntarily be admitted to mental health.  There is not currently an inpatient bed available, so she will be held in the emergency department until a bed opens in the morning.  Will continue to monitor patient as we await final disposition.
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
## 2017-08-05 NOTE — ED Notes (Signed)
TRANSFER - OUT REPORT:    Verbal report given to Kim, RN on Linda [name]  being transferred to Behavioral Health for routine progression of care       Report consisted of patient???s Situation, Background, Assessment and   Recommendations(SBAR).     Information from the following report(s) ED Summary was reviewed with the receiving nurse.    Lines:       Opportunity for questions and clarification was provided.      Patient transported with:   Security

## 2017-08-05 NOTE — Other (Signed)
OCCUPATIONAL THERAPY PROGRESS NOTE    Group Time:  1430  Attendance:    The patient attended full group..  Participation:.  The patient participated with minimal elaboration in the activity. .  Attention:  The patient was able to focus on the activity.  Interaction:.  The patient acknowledges others or responds to questions,  with no spontaneous interaction.  Affect blunted, little interaction even on approach.

## 2017-08-05 NOTE — Behavioral Health Treatment Team (Signed)
GROUP THERAPY PROGRESS NOTE    Linda [name] is not participating in Recreational Music Therapy??.   ??  Group time:??1 Hour  ??  Personal goal for participation:??Treatment addresses improving motor skills (physical, occupational, and recreation therapies); cognitive skills (special education and speech and language therapy); affective states and adjustment (psychology); and social skills (all disciplines)??plus getting easy listening music that relaxes the body and mind (Patient Did Not Participate).  ??  Goal orientation:??Social  ??  Group therapy participation:??Patient Did Not Participate.  ??  Therapeutic interventions reviewed and discussed:??Getting and understanding the meaning of recreational and music therapy.Music??therapy??is??a planned treatment intervention with well-defined goals and objectives??,a process in which a relationship develops within and by means of musical experiences,an intervention based on a pre-treatment, individualized music therapy assessment,??an experience in which the music and the modes of musical expression are chosen to meet the individual client???s needs,an intervention which can address physical, psychological, social, communication, and behavioral goals.??Recreational??therapy is??based on engagement in recreational activities (as sports or music) especially to enhance the functioning, independence, and well-being of individuals affected with a disabling condition.  ??  Impression of participation:??Staff has encouraged patient to participate but patient decline (Resting).

## 2017-08-05 NOTE — Behavioral Health Treatment Team (Signed)
Patient has been interacting with peers. She denied suicidal/homicidal ideations. She stated she is here because "I told my grandmother I was going to jump off the bridge". She stated "I told her that because I felt I was on my own". She has been on the phone throughout shift. She was encouraged to attend and participate in group. She was compliant with medication. Nursing will continue to provide safety and support.

## 2017-08-05 NOTE — Other (Signed)
ART THERAPY GROUP PROGRESS NOTE    PATIENT SCHEDULED FOR GROUP AT: 1330    ATTENDANCE: Full    PARTICIPATION LEVEL: Participates fully in the art process    ATTENTION LEVEL: Able to focus on task    FOCUS: Control     SYMBOLIC & THEMATIC CONTENT AS NOTED IN IMAGERY: She was calm, compliant, and invested in the task at hand. She identified that she is easily agitated and has difficulty managing her anger and aggression. She claimed that she usually "smokes marijuana" to "cope" or calm herself down. She also claimed that she utilizes music as a healthy means to cope. She claimed that she feels she was "backed up to a wall" regarding her reason for being here, and that she acted and spoke on impulse, which she now regrets.

## 2017-08-05 NOTE — Behavioral Health Treatment Team (Signed)
 Patient has been interacting with peers. She denied suicidal/homicidal ideations. She stated she is here because I told my grandmother I was going to jump off the bridge. She stated I told her that because I felt I was on my own. She has been on the phone throughout shift. She was encouraged to attend and participate in group. She was compliant with medication. Nursing will continue to provide safety and support.

## 2017-08-05 NOTE — ED Notes (Signed)
2100 AM :Pt care assumed from Dr. Camie Patience , ED provider. Pt complaint(s), current treatment plan, progression and available diagnostic results have been discussed thoroughly.  Rounding occurred: yes  Intended Disposition: TBD   Pending diagnostic reports and/or labs (please list): crisis eval      The patient has remained stable while under my care after signout. The patient has been resting comfortably, is in NAD and their vitals have remained stable.  Patient has been seen by crisis, who does feel she meets inpatient criteria.  Patient will voluntarily be admitted to mental health.  There is not currently an inpatient bed available, so she will be held in the emergency department until a bed opens in the morning.  Will continue to monitor patient as we await final disposition.

## 2017-08-05 NOTE — Behavioral Health Treatment Team (Signed)
 GROUP THERAPY PROGRESS NOTE    Linda [name] is not participating in Recreational Music Therapy.     Group time:1 Hour    Personal goal for participation:Treatment addresses improving motor skills (physical, occupational, and recreation therapies); cognitive skills (special education and speech and language therapy); affective states and adjustment (psychology); and social skills (all disciplines)plus getting easy listening music that relaxes the body and mind (Patient Did Not Participate).    Goal orientation:Social    Group therapy participation:Patient Did Not Participate.    Therapeutic interventions reviewed and discussed:Getting and understanding the meaning of recreational and music therapy.Musictherapyisa planned treatment intervention with well-defined goals and objectives,a process in which a relationship develops within and by means of musical experiences,an intervention based on a pre-treatment, individualized music therapy assessment,an experience in which the music and the modes of musical expression are chosen to meet the individual client's needs,an intervention which can address physical, psychological, social, communication, and behavioral goals.Recreationaltherapy isbased on engagement in recreational activities (as sports or music) especially to enhance the functioning, independence, and well-being of individuals affected with a disabling condition.    Impression of participation:Staff has encouraged patient to participate but patient decline (Resting).

## 2017-08-05 NOTE — ED Notes (Signed)
 TRANSFER - OUT REPORT:    Verbal report given to Luke, RN on Linda [name]  being transferred to [facility] for routine progression of care       Report consisted of patient's Situation, Background, Assessment and   Recommendations(SBAR).     Information from the following report(s) ED Summary was reviewed with the receiving nurse.    Lines:       Opportunity for questions and clarification was provided.      Patient transported with:   Security
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
## 2017-08-06 MED FILL — IBUPROFEN 400 MG TAB: 400 mg | ORAL | Qty: 1

## 2017-08-06 MED FILL — SYNTHROID 75 MCG TABLET: 75 mcg | ORAL | Qty: 1

## 2017-08-06 MED FILL — TRAZODONE 100 MG TAB: 100 mg | ORAL | Qty: 1

## 2017-08-06 NOTE — Other (Signed)
SW assessment/Intervention: Patient reports she is prepared for discharge.  Patient denies SI/HI.  Patient denies AVH.  SW encouraged patient to continue with follow up care.    Denny Levy, MSW, Child psychotherapist

## 2017-08-06 NOTE — Progress Notes (Signed)
Patient received discharge instructions. Verbalized understanding of follow up. All personal personal items given to patient.

## 2017-08-06 NOTE — Behavioral Health Treatment Team (Signed)
GROUP THERAPY PROGRESS NOTE    Gracia Mccamy is participating in Recreational Therapy.     Group time: 30 minutes    Personal goal for participation: To find coping skills while in the hospital    Goal orientation: relaxation    Group therapy participation: active    Therapeutic interventions reviewed and discussed: Rules and regulations of the group.     Impression of participation: Pt was motivated to continue to stay encouraged with her treatment.

## 2017-08-06 NOTE — Behavioral Health Treatment Team (Signed)
Pt slept for about 8 hrs during the night.

## 2017-08-06 NOTE — Behavioral Health Treatment Team (Signed)
Treatment team met -     Medical Director: _____present   Psychiatrist: _x____present   Charge nurse: __x___present   MSW: _x____present   Administrative Director: _____present   Nurse Manager: _____present   Student RNs: _____present   Medical Students: _____present   Art Therapist: __x___present   Clinical Coordinator: _x____present    Occupational Therapist: _____present   Chaplain: _______ present  UR Case Manager ___x____ present  Crisis Supervisor___x____present      Plan of care discussed and updated as appropriate.
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
## 2017-08-06 NOTE — Progress Notes (Addendum)
Spirituality Group was attended by Linda [name], who is a 26 y.o.,female. Patient???s Primary Language is: English.   According to the patient???s EMR Religious Affiliation is: Christian.     The reason the Patient came to the hospital is:   Patient Active Problem List    Diagnosis Date Noted   ??? Borderline personality disorder (HCC) 08/06/2017   ??? Persistent depressive disorder 08/05/2017   ??? Hypothyroidism    ??? ADHD (attention deficit hyperactivity disorder)    ??? Morbid obesity (HCC)    ??? Anemia    ??? Recurrent UTI    ??? Vision decreased    ??? Headache 03/10/2014   ??? Hypomagnesemia 03/09/2014   ??? Sacroiliitis (HCC) 11/12/2011   ??? GBS (group B Streptococcus carrier), +RV culture, currently pregnant 08/09/2011   ??? Hypokalemia 05/22/2011   ??? Hyponatremia 01/03/2011   ??? Microcytic anemia 01/01/2008        Chaplain conducted Spirituality Group and explored themes on the topic of joy using an exercise where you write down (1) a gift you want to give yourself; (2) a gift for someone you love; and (3) a gift you would like to give a stranger. Then you fold up the paper and decorate as a gift. We each open our gifts and share.   Listened empathically and offered a safe place to share beliefs and feelings.  Provided information about Spiritual Care Services.    Linda [name] wanted to give herself the gift of 1- "stop letting things get to me" and 2-"it's OK to be myself." On the second gift to someone you love, she wanted a better relationship with her mother and for her son to have another kidney so he could be healthier and do sports. For a stranger, she would give to the homeless. Linda [name] actively participated in the group and seemed to like the theme of ways to access joy. At the end, each person got a scripture about joy.    Chaplain Marie Kirk-Clunan  Spiritual Care   [phone]

## 2017-08-06 NOTE — Progress Notes (Signed)
Patient received discharge instructions. Verbalized understanding of follow up. All personal personal items given to patient.

## 2017-08-06 NOTE — Behavioral Health Treatment Team (Signed)
GROUP THERAPY PROGRESS NOTE    Linda [name] is participating in Recreational Therapy.     Group time: 30 minutes    Personal goal for participation: To find coping skills while in the hospital    Goal orientation: relaxation    Group therapy participation: active    Therapeutic interventions reviewed and discussed: Rules and regulations of the group.     Impression of participation: Pt was motivated to continue to stay encouraged with her treatment.

## 2017-08-06 NOTE — Progress Notes (Signed)
 Spirituality Group was attended by Linda [name], who is a 26 y.o.,female. Patient's Primary Language is: [place].   According to the patient's EMR Religious Affiliation is: [place].     The reason the Patient came to the hospital is:   Patient Active Problem List    Diagnosis Date Noted   . Borderline personality disorder (HCC) 08/06/2017   . Persistent depressive disorder 08/05/2017   . Hypothyroidism    . ADHD (attention deficit hyperactivity disorder)    . Morbid obesity (HCC)    . Anemia    . Recurrent UTI    . Vision decreased    . Headache 03/10/2014   . Hypomagnesemia 03/09/2014   . Sacroiliitis (HCC) 11/12/2011   . GBS (group B Streptococcus carrier), +RV culture, currently pregnant 08/09/2011   . Hypokalemia 05/22/2011   . Hyponatremia 01/03/2011   . Microcytic anemia 01/01/2008        Chaplain conducted Spirituality Group and explored themes on the topic of joy using an exercise where you write down (1) a gift you want to give yourself; (2) a gift for someone you love; and (3) a gift you would like to give a stranger. Then you fold up the paper and decorate as a gift. We each open our gifts and share.   Listened empathically and offered a safe place to share beliefs and feelings.  Provided information about Spiritual Care Services.    Linda [name] wanted to give herself the gift of 1- stop letting things get to me and 2-it's OK to be myself. On the second gift to someone you love, she wanted a better relationship with her mother and for her son to have another kidney so he could be healthier and do sports. For a stranger, she would give to the homeless. Linda [name] actively participated in the group and seemed to like the theme of ways to access joy. At the end, each person got a scripture about joy.    [name]  Spiritual Care   [phone]

## 2017-09-14 ENCOUNTER — Inpatient Hospital Stay
Admit: 2017-09-14 | Discharge: 2017-09-14 | Disposition: A | Discharge status: Home / Self Care | Payer: MEDICAID | Attending: Emergency Medicine

## 2017-09-14 DIAGNOSIS — O0739 Failed attempted termination of pregnancy with other complications: Secondary | ICD-10-CM

## 2017-09-14 LAB — POC HCG,URINE
HCG urine, QL: POSITIVE — AB
Pregnancy Test(Urn): POSITIVE — AB

## 2017-09-14 MED ORDER — CYCLOBENZAPRINE 10 MG TAB
10 mg | ORAL_TABLET | Freq: Three times a day (TID) | ORAL | 0 refills | Status: DC | PRN
Start: 2017-09-14 — End: 2017-10-17
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
## 2017-09-14 NOTE — ED Notes (Signed)
10:49 AM  09/14/17     Discharge instructions given to Garry Chlebowski (name) with verbalization of understanding. Patient accompanied by self.  Patient discharged with the following prescriptions flexeril. Patient discharged to home (destination).      Chassidy A Mobley, RN
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
## 2017-09-14 NOTE — ED Notes (Signed)
10:49 AM  09/14/17     Discharge instructions given to Linda [name] (name) with verbalization of understanding. Patient accompanied by self.  Patient discharged with the following prescriptions flexeril. Patient discharged to home (destination).      Gretta Began, RN
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
## 2017-09-24 ENCOUNTER — Emergency Department: Admit: 2017-09-25 | Payer: MEDICAID

## 2017-09-24 DIAGNOSIS — O200 Threatened abortion: Secondary | ICD-10-CM

## 2017-09-24 NOTE — ED Triage Notes (Signed)
Chest pain started 2 days ago  Has gotten worse  Vomiting for 3 weeks

## 2017-09-24 NOTE — Progress Notes (Signed)
PERIPHERAL [facility]ASC[facility] LAB    Preliminary results.    No evidence of deep vein thrombosis in the right lower extremity.    Final report to follow.    Victoria Goulet, RVT, RDMS

## 2017-09-24 NOTE — ED Notes (Signed)
Care assummed and pt in room eating chips with family

## 2017-09-24 NOTE — ED Notes (Signed)
Pulling sensation in chest while breathing feels really tight. Has been vomiting non stop x 2- 3 weeks. Pregnancy confirmed 2 weeks ago. Spotting since yesterday. EKG done in triage.

## 2017-09-24 NOTE — ED Notes (Signed)
PVL in progress at this time.

## 2017-09-24 NOTE — ED Notes (Signed)
Patient not keeping her arm straight for IV fluids to flow in   Said she can not keep her arm straight if she does not get something to drink--  Gave patient some water to sip on.

## 2017-09-24 NOTE — ED Provider Notes (Signed)
[facility] Care  Emergency Department Treatment Report    Patient: Linda [name] Age: 27 y.o. Sex: female    Date of Birth: 10/06/1990 Admit Date: 09/24/2017 PCP: Sol Blazing, MD   MRN: [id number]  CSN: [id number]  Attending: Gwenyth Allegra, MD   Room: ER36/ER36 Time Dictated: 9:28 PM APP:  Dan Humphreys, PA-C     Chief Complaint   Vomiting; pregnancy problem   History of Present Illness   27 y.o. female presents complaining of multiple complaints.  She is complaining of intermittent lower abdominal cramping and vaginal spotting for the past 3 weeks. Her last normal menstrual cycle was 07/30/17. She was seen here on 09/14/17 and told she was pregnant. She does not have an appointment with OBGYN yet. She is G8 P4 A3. She complains of nausea and vomiting for the past 3 weeks. She states chewing gum helps. Denies fever, chills, diarrhea or urinary symptoms.   She also complains of intermittent pain and swelling to right lower extremity for the past 1 week. Denies trauma or injury to right leg. Denies redness or rashes.   Denies history of thromboembolic disease.   She also complaining of intermittent chest pain exacerbated after episodes of vomiting for the past 3 weeks. Denies pleuritic pain. Denies shortness of breath or difficulty breathing. Denies diaphoresis.     Review of Systems     Constitutional: No fever, chills  Eyes: No visual symptoms.  ENT: No sore throat, runny nose  Respiratory: No cough, dyspnea or wheezing.  Cardiovascular: positive chest pain  Gastrointestinal: positive vomiting and intermittent lower abdominal pain  Genitourinary: positive vaginal spotting, No dysuria or  frequency  Musculoskeletal:positive right lower extremity swelling/pain  Integumentary: No rashes.  Neurological: No headaches  Denies complaints in all other systems.    Past Medical/Surgical History     Past Medical History:   Diagnosis Date   ??? ADHD (attention deficit hyperactivity disorder)     ??? Bipolar disorder (HCC)    ??? BV (bacterial vaginosis)     On Several Occasions   ??? Chlamydia 04/25/2011    07/07/14, 04/12/14, 02/14/14, 07/03/12, 04/25/11    ??? Depression    ??? Gestational diabetes    ??? Gonorrhea 01/27/2013    07/07/14, 02/14/14, 01/27/13   ??? Headache 03/10/2014    CT Head W/O Contrast: Normal   ??? Hypokalemia 05/22/2011    3.4 mmol/L   ??? Hypomagnesemia 03/09/2014    1.5 mg/dL   ??? Hyponatremia 01/03/2011    132 mmol/L   ??? Hypothyroidism    ??? Microcytic anemia 01/01/2008   ??? Morbid obesity (HCC)    ??? OCD (obsessive compulsive disorder)    ??? Recurrent UTI    ??? Sacroiliitis (HCC) 11/12/2011    Noted on Lumbar X-ray: Left Sided Sacroliitis    ??? Strep throat 05/22/2011    + RS   ??? Vision decreased      Past Surgical History:   Procedure Laterality Date   ??? HX DILATION AND CURETTAGE  09/2013     Social History     Social History     Socioeconomic History   ??? Marital status: SINGLE     Spouse name: Not on file   ??? Number of children: Not on file   ??? Years of education: Not on file   ??? Highest education level: Not on file   Tobacco Use   ??? Smoking status: Never Smoker   ??? Smokeless tobacco: Never Used  Substance and Sexual Activity   ??? Alcohol use: Yes   ??? Drug use: Yes     Types: Marijuana   ??? Sexual activity: Yes     Partners: Male     Birth control/protection: None     Family History     Family History   Problem Relation Age of Onset   ??? Hypertension Maternal Grandmother    ??? Thyroid Disease Maternal Grandmother    ??? Diabetes Maternal Grandmother    ??? Hypertension Maternal Grandfather    ??? Hypertension Paternal Grandfather    ??? Hypertension Mother    ??? Asthma Mother    ??? Diabetes Mother    ??? Thyroid Disease Brother    ??? Diabetes Brother    ??? No Known Problems Sister    ??? Diabetes Father    ??? Diabetes Paternal Grandmother    ??? Thyroid Disease Maternal Aunt      Home Medications     Prior to Admission Medications   Prescriptions Last Dose Informant Patient Reported? Taking?    cyclobenzaprine (FLEXERIL) 10 mg tablet   No No   Sig: Take 1 Tab by mouth three (3) times daily as needed for Muscle Spasm(s).   levothyroxine (SYNTHROID) 75 mcg tablet   No No   Sig: Take 1 Tab by mouth Daily (before breakfast). Indications: HYPOTHYROIDISM   Patient taking differently: Take 100 mcg by mouth Daily (before breakfast). Indications: hypothyroidism   traZODone (DESYREL) 100 mg tablet   Yes No   Sig: Take 200 mg by mouth nightly.      Facility-Administered Medications: None     Allergies     Allergies   Allergen Reactions   ??? Percocet [Oxycodone-Acetaminophen] Nausea and Vomiting     Physical Exam     ED Triage Vitals [09/24/17 2102]   ED Encounter Vitals Group      BP 177/78      Pulse (Heart Rate) 92      Resp Rate 16      Temp 99.4 ??F (37.4 ??C)      Temp src       O2 Sat (%) 100 %      Weight 330 lb      Height 5\' 5"        Constitutional: Patient appears well developed and well nourished. Appearance and behavior are age and situation appropriate.  HEENT: Conjunctiva clear. PERRLA. Mucous membranes moist, non-erythematous. Surface of the pharynx, palate, and tongue are pink, moist and without lesions.  Neck: supple, non tender, symmetrical, no masses or JVD. No lymphadenopathy.  Respiratory: lungs clear to auscultation, nonlabored respirations. No tachypnea or accessory muscle use.  Cardiovascular: heart regular rate and rhythm without murmur rubs or gallops.    Gastrointestinal:  Abdomen soft, nondistended, nontender without complaint of pain to palpation, no CVA tenderness noted bilaterally  Genitalia: External genitalia without swelling, lesions, or discharge. Vaginal walls have no bulging or lesions. Cervix pink with no lesions or discharge noted. No pain on cervical motion. Cervical os is closed. Uterus movable without mass or tenderness. No adnexal mass or tenderness noted bilaterally  Musculoskeletal: Calves soft and non-tender. Distal pulses 2+ and equal bilaterally.  No peripheral edema     Integumentary: warm and dry without rashes or lesions  Neurologic: alert and oriented. No facial asymmetry or dysarthria. Moving all extremities well    Impression and Management Plan   27 y.o. female presents complaining of multiple complaints: 1.  Lower abdominal cramping and vaginal  spotting during suspected early pregnancy.  She has no reproducible abdominal or pelvic tenderness on exam and no active vaginal bleeding and cervical os is closed.  Her blood type is confirmed to be O Rh+.  We will obtain a quantitative beta-hCG and transvaginal ultrasound for further evaluation. 2.  She complains of nausea and vomiting for the past several weeks which I suspect secondary to the pregnancy.  We will medicate with IV Reglan 10 mg and give IV fluid hydration. 3.  She complains of intermittent chest pain after episodes of vomiting.  Denies pleuritic pain or shortness of breath.  Pain is reproducible with palpation of the anterior chest wall and with certain movement.  I suspect most likely muscular pain.  We will obtain EKG and chest x-ray. 4.  She complains of right lower extremity pain and swelling for the last several days.  No appreciated right lower extremity edema or tenderness to palpation.  We will obtain a PVL of the right lower extremity for further evaluation.  Overall patient is very well-appearing, nontoxic and her vital signs are hemodynamically stable.    Diagnostic Studies   Lab:   Recent Results (from the past 12 hour(s))   POC URINE MACROSCOPIC    Collection Time: 09/24/17  9:17 PM   Result Value Ref Range    Glucose Negative NEGATIVE,Negative mg/dl    Bilirubin Negative NEGATIVE,Negative      Ketone Negative NEGATIVE,Negative mg/dl    Specific gravity 4.098 1.005 - 1.030      Blood Negative NEGATIVE,Negative      pH (UA) 6.5 5 - 9      Protein 30 (A) NEGATIVE,Negative mg/dl    Urobilinogen 0.2 0.0 - 1.0 EU/dl    Nitrites Negative NEGATIVE,Negative      Leukocyte Esterase Negative NEGATIVE,Negative       Color Yellow      Appearance Clear     POC HCG,URINE    Collection Time: 09/24/17  9:18 PM   Result Value Ref Range    HCG urine, QL positive (A) NEGATIVE,Negative,negative     BETA HCG, QT    Collection Time: 09/24/17  9:20 PM   Result Value Ref Range    HCG, beta, QT 57,020 mIU/ml   WET PREP    Collection Time: 09/24/17 11:20 PM   Result Value Ref Range    Wet prep        Clue Cells Seen  No Yeast or motile Trichomonas seen         Imaging:    Xr Chest Sngl V    Result Date: 09/24/2017  Impression: Subtle bilateral lung base densities are identified, which may reflect atelectasis or tiny effusions. No obvious focal consolidations. No overt congestive change. Mild cardiomegaly. Right ventral chest wall MediPort with tip overlying the distal SVC.     Ultrasound pelvis:   Single live intrauterine gestation identified, 10 weeks and one day by crown-rump length. Fetal heart rate detected at 142 beats per minute. Probable right corpus luteum measuring 2.0 cm per left ovary is unremarkable. Doppler flow detected in the ovaries. No perigestational hemorrhage or free fluid.    Impression  Single live intrauterine gestation, 10 weeks and one day. Probable right corpus luteum.     Electronically signed on Sep 25, 2017 1:07:49 AM EDT by:  Governor Specking, MD  Diplomate, [employer] Rad[employer]    PERIPHERAL VASCULAR LAB  ??  Preliminary results.  ??  No evidence of deep vein thrombosis in  the right lower extremity.  ??  Final report to follow.  ??  Eusebio MeVictoria Goulet, RVT, RDMS          Electronically signed by Eusebio MeGoulet, [place] at 09/24/17 2228     ED Course/Medical Decision Making   Patient remained stable throughout her stay in the ED with no new or worsening symptoms.  I have reviewed the results with the patient.  Transvaginal ultrasound shows intrauterine pregnancy 10 weeks 1 day with fetal heart rate of 142 bpm.  I suspect threatened miscarriage and patient  advised pelvic rest and close follow-up with her OB/GYN.  PVL of the right lower extremity is negative for DVT.  She is encouraged to elevate her legs to help with any swelling and she may take over-the-counter Tylenol as directed as needed for pain.  EKG and chest x-ray appear unremarkable.  Again I suspect musculoskeletal chest wall pain.  She [facility] she got last week for her back pain that she did not get filled.  She is advised that this is safe to take during pregnancy.  Again advised to take over-the-counter Tylenol to help as needed for any chest wall pain.  Patient was given IV Reglan and IV fluids hydration which she tolerated well.  She had no recurrent episodes of vomiting during her stay and nausea was improved.  She was able to tolerate oral fluids without difficulty.  We will discharge home with a prescription for Reglan.  She is encouraged to follow-up closely with OB/GYN.  Patient advised to return to the ED for new or worsening symptoms.  Medications   sodium chloride 0.9 % bolus infusion 1,000 mL (0 mL IntraVENous IV Completed 09/25/17 0012)   metoclopramide HCl (REGLAN) injection 10 mg (10 mg IntraVENous Given 09/24/17 2138)     Final Diagnosis        ICD-10-CM ICD-9-CM   1. Threatened miscarriage O20.0 640.00   2. Non-intractable vomiting with nausea, unspecified vomiting type R11.2 787.01   3. Chest wall pain R07.89 786.52   4. Pain and swelling of right lower extremity M79.604 729.5    M79.89 729.81       Disposition   Patient stable for discharge home    Discharge Medication List as of 09/25/2017  1:19 AM      START taking these medications    Details   metoclopramide HCl (REGLAN) 10 mg tablet Take 1 Tab by mouth every six (6) hours as needed for Nausea for up to 10 days., Print, Disp-12 Tab, R-0         CONTINUE these medications which have NOT CHANGED    Details   cyclobenzaprine (FLEXERIL) 10 mg tablet Take 1 Tab by mouth three (3)  times daily as needed for Muscle Spasm(s)., Print, Disp-12 Tab, R-0      traZODone (DESYREL) 100 mg tablet Take 200 mg by mouth nightly., Historical Med      levothyroxine (SYNTHROID) 75 mcg tablet Take 1 Tab by mouth Daily (before breakfast). Indications: HYPOTHYROIDISM, Normal, Disp-30 Tab, R-2           The patient was fully discussed with Gwenyth AllegraNELSON, TIMOTHY S, MD who agrees with the above assessment and plan      Luna Fuseshley Loic Hobin PA-C  September 25, 2017    My signature above authenticates this document and my orders, the final ??  diagnosis (es), discharge prescription (s), and instructions in the Epic ??  record.  If you have any questions  please contact [phone].  ??  Nursing notes have been reviewed by the physician/ advanced practice ??  Clinician.

## 2017-09-24 NOTE — ED Notes (Signed)
Pulling sensation in chest while breathing feels really tight. Has been vomiting non stop x 2- 3 weeks. Pregnancy confirmed 2 weeks ago. Spotting since yesterday. EKG done in triage.

## 2017-09-24 NOTE — Progress Notes (Signed)
PERIPHERAL VASCULAR LAB    Preliminary results.    No evidence of deep vein thrombosis in the right lower extremity.    Final report to follow.    Eusebio Me, RVT, RDMS

## 2017-09-24 NOTE — ED Notes (Signed)
Patient not keeping her arm straight for IV fluids to flow in   [place] arm straight if she does not get something to drink--  Gave patient some water to sip on.

## 2017-09-24 NOTE — ED Notes (Signed)
Chest pain started 2 days ago  Has gotten worse  Vomiting for 3 weeks

## 2017-09-24 NOTE — ED Provider Notes (Signed)
ED Provider Notes by Dan Humphreys, PA-C at 09/24/17 2128                Author: Dan Humphreys, PA-C  Service: EMERGENCY  Author Type: Physician Assistant       Filed: 09/25/17 0408  Date of Service: 09/24/17 2128  Status: Attested           Editor: Foye Clock (Physician Assistant)  Cosigner: Gwenyth Allegra, MD at 09/25/17 [phone]          Attestation signed by Gwenyth Allegra, MD at 09/25/17 [phone]          I have discussed this case with the advanced practice provider, Luna Fuse. We have reviewed the presentation, pertinent historical and physical exam findings,  as well as relevant laboratory/radiology findings. I have been available at all times and agree with the management and disposition documented here.                                    [facility]   Emergency Department Treatment Report          Patient: Linda [name]  Age: 27 y.o.  Sex: female          Date of Birth: 1990/08/21  Admit Date: 09/24/2017  PCP: Sol Blazing, MD     MRN: [id number]   CSN: [id number]   Attending: Gwenyth Allegra, MD         Room: ER36/ER36  Time Dictated: 9:28 PM  APP:  Dan Humphreys, PA-C        Chief Complaint    Vomiting; pregnancy problem      History of Present Illness     27 y.o. female  presents complaining of multiple complaints.   She is complaining of intermittent lower abdominal cramping and vaginal spotting for the past 3 weeks. Her last normal menstrual cycle was 07/30/17. She was seen here on 09/14/17 and told she was pregnant. She does not have an appointment with OBGYN yet.  She is G8 P4 A3. She complains of nausea and vomiting for the past 3 weeks. She states chewing gum helps. Denies fever, chills, diarrhea or urinary symptoms.    She also complains of intermittent pain and swelling to right lower extremity for the past 1 week. Denies trauma or injury to right leg. Denies redness or rashes.    Denies history of thromboembolic disease.    She also complaining of  intermittent chest pain exacerbated after episodes of vomiting for the past 3 weeks. Denies pleuritic pain. Denies shortness of breath or difficulty breathing. Denies diaphoresis.         Review of Systems        Constitutional: No fever, chills   Eyes: No visual symptoms.   ENT: No sore throat, runny nose   Respiratory: No cough, dyspnea or wheezing.   Cardiovascular: positive chest pain   Gastrointestinal: positive vomiting and intermittent lower abdominal pain   Genitourinary: positive vaginal spotting, No dysuria or  frequency   Musculoskeletal:positive right lower extremity swelling/pain   Integumentary: No rashes.   Neurological: No headaches   Denies complaints in all other systems.        Past Medical/Surgical History          Past Medical History:        Diagnosis  Date         ?  ADHD (attention deficit hyperactivity disorder)       ?  Bipolar disorder (HCC)       ?  BV (bacterial vaginosis)            On Several Occasions         ?  Chlamydia  04/25/2011          07/07/14, 04/12/14, 02/14/14, 07/03/12, 04/25/11          ?  Depression       ?  Gestational diabetes       ?  Gonorrhea  01/27/2013          07/07/14, 02/14/14, 01/27/13         ?  Headache  03/10/2014          CT Head W/O Contrast: Normal         ?  Hypokalemia  05/22/2011          3.4 mmol/L         ?  Hypomagnesemia  03/09/2014          1.5 mg/dL         ?  Hyponatremia  01/03/2011          132 mmol/L         ?  Hypothyroidism       ?  Microcytic anemia  01/01/2008     ?  Morbid obesity (HCC)       ?  OCD (obsessive compulsive disorder)       ?  Recurrent UTI       ?  Sacroiliitis (HCC)  11/12/2011          Noted on Lumbar X-ray: Left Sided Sacroliitis          ?  Strep throat  05/22/2011          + RS         ?  Vision decreased            Past Surgical History:         Procedure  Laterality  Date          ?  HX DILATION AND CURETTAGE    09/2013          Social History          Social History          Socioeconomic History         ?  Marital  status:  SINGLE              Spouse name:  Not on file         ?  Number of children:  Not on file     ?  Years of education:  Not on file     ?  Highest education level:  Not on file       Tobacco Use         ?  Smoking status:  Never Smoker     ?  Smokeless tobacco:  Never Used       Substance and Sexual Activity         ?  Alcohol use:  Yes     ?  Drug use:  Yes              Types:  Marijuana         ?  Sexual activity:  Yes              Partners:  Male              Birth control/protection:  None          Family History          Family History         Problem  Relation  Age of Onset          ?  Hypertension  Maternal Grandmother       ?  Thyroid Disease  Maternal Grandmother       ?  Diabetes  Maternal Grandmother       ?  Hypertension  Maternal Grandfather       ?  Hypertension  Paternal Grandfather       ?  Hypertension  Mother       ?  Asthma  Mother       ?  Diabetes  Mother       ?  Thyroid Disease  Brother       ?  Diabetes  Brother       ?  No Known Problems  Sister       ?  Diabetes  Father       ?  Diabetes  Paternal Grandmother            ?  Thyroid Disease  Maternal Aunt            Home Medications          Prior to Admission Medications     Prescriptions  Last Dose  Informant  Patient Reported?  Taking?      cyclobenzaprine (FLEXERIL) 10 mg tablet      No  No      Sig: Take 1 Tab by mouth three (3) times daily as needed for Muscle Spasm(s).      levothyroxine (SYNTHROID) 75 mcg tablet      No  No      Sig: Take 1 Tab by mouth Daily (before breakfast). Indications: HYPOTHYROIDISM      Patient taking differently: Take 100 mcg by mouth Daily (before breakfast). Indications: hypothyroidism      traZODone (DESYREL) 100 mg tablet      Yes  No      Sig: Take 200 mg by mouth nightly.               Facility-Administered Medications: None          Allergies          Allergies        Allergen  Reactions         ?  Percocet [Oxycodone-Acetaminophen]  Nausea and Vomiting          Physical Exam          ED Triage  Vitals [09/24/17 2102]     ED Encounter Vitals Group           BP  177/78        Pulse (Heart Rate)  92        Resp Rate  16        Temp  99.4 ??F (37.4 ??C)        Temp src          O2 Sat (%)  100 %        Weight  330 lb           Height  5\' 5"            Constitutional: Patient appears well developed  and well nourished. Appearance and behavior are age and situation appropriate.   HEENT: Conjunctiva clear. PERRLA. Mucous membranes moist, non-erythematous. Surface of the pharynx, palate, and tongue are pink, moist and  without lesions.   Neck: supple, non tender, symmetrical, no masses or JVD. No lymphadenopathy.   Respiratory: lungs clear to auscultation, nonlabored respirations. No tachypnea or accessory muscle use.   Cardiovascular: heart regular rate and rhythm without murmur rubs or gallops.     Gastrointestinal:  Abdomen soft, nondistended, nontender without complaint of pain to palpation, no CVA tenderness noted bilaterally   Genitalia: External genitalia without swelling, lesions, or discharge. Vaginal walls have no bulging or lesions. Cervix pink with no lesions or  discharge noted. No pain on cervical motion. Cervical os is closed. Uterus movable without mass or tenderness. No adnexal mass or tenderness noted bilaterally   Musculoskeletal: Calves soft and non-tender. Distal pulses 2+ and equal bilaterally.  No peripheral edema     Integumentary: warm and dry without rashes or lesions   Neurologic: alert and oriented. No facial asymmetry or dysarthria. Moving all extremities well        Impression and Management Plan     27 y.o. female  presents complaining of multiple complaints: 1.  Lower abdominal cramping and vaginal spotting during suspected early pregnancy.  She has no reproducible abdominal or pelvic tenderness on exam and no active vaginal bleeding and cervical os is closed.   Her blood type is confirmed to be O Rh+.  We will obtain a quantitative beta-hCG and transvaginal ultrasound for further  evaluation. 2.  She complains of nausea and vomiting for the past several weeks which I suspect secondary to the pregnancy.  We will  medicate with IV Reglan 10 mg and give IV fluid hydration. 3.  She complains of intermittent chest pain after episodes of vomiting.  Denies pleuritic pain or shortness of breath.  Pain is reproducible with palpation of the anterior chest wall and with  certain movement.  I suspect most likely muscular pain.  We will obtain EKG and chest x-ray. 4.  She complains of right lower extremity pain and swelling for the last several days.  No appreciated right lower extremity edema or tenderness to palpation.   We will obtain a PVL of the right lower extremity for further evaluation.   Overall patient is very well-appearing, nontoxic and her vital signs are hemodynamically stable.        Diagnostic Studies     Lab:      Recent Results (from the past 12 hour(s))     POC URINE MACROSCOPIC          Collection Time: 09/24/17  9:17 PM         Result  Value  Ref Range            Glucose  Negative  NEGATIVE,Negative mg/dl       Bilirubin  Negative  NEGATIVE,Negative         Ketone  Negative  NEGATIVE,Negative mg/dl       Specific gravity  1.025  1.005 - 1.030         Blood  Negative  NEGATIVE,Negative         pH (UA)  6.5  5 - 9         Protein  30 (A)  NEGATIVE,Negative mg/dl       Urobilinogen  0.2  0.0 - 1.0 EU/dl       Nitrites  Negative  NEGATIVE,Negative         Leukocyte Esterase  Negative  NEGATIVE,Negative         Color  Yellow          Appearance  Clear          POC HCG,URINE          Collection Time: 09/24/17  9:18 PM         Result  Value  Ref Range            HCG urine, QL  positive (A)  NEGATIVE,Negative,negative         BETA HCG, QT          Collection Time: 09/24/17  9:20 PM         Result  Value  Ref Range            HCG, beta, QT  57,020  mIU/ml       WET PREP          Collection Time: 09/24/17 11:20 PM         Result  Value  Ref Range            Wet prep                  Clue Cells  Seen   No Yeast or motile Trichomonas seen              Imaging:     Xr Chest Sngl V      Result Date: 09/24/2017   Impression: Subtle bilateral lung base densities are identified, which may reflect atelectasis or tiny effusions. No obvious focal consolidations. No overt congestive change. Mild cardiomegaly. Right ventral chest wall MediPort with tip overlying the  distal SVC.       Ultrasound pelvis:    Single live intrauterine gestation identified, 10 weeks and one day by crown-rump length. Fetal heart rate detected at 142 beats per minute. Probable right corpus luteum measuring 2.0 cm per left ovary is unremarkable. Doppler flow detected in the ovaries.  No perigestational hemorrhage or free fluid.      Impression   Single live intrauterine gestation, 10 weeks and one day. Probable right corpus luteum.       Electronically signed on Sep 25, 2017 1:07:49 AM EDT by:   Governor Specking, MD   Diplomate, American Board of Radiology      PERIPHERAL VASCULAR LAB   ??   Preliminary results.   ??   No evidence of deep vein thrombosis in the right lower extremity.   ??   Final report to follow.   ??   Eusebio Me, RVT, RDMS                   Electronically signed by Eusebio Me at 09/24/17 2228         ED Course/Medical Decision Making     Patient remained stable throughout her stay in the ED with no new or worsening symptoms.  I have reviewed the results with the patient.  Transvaginal ultrasound shows  intrauterine pregnancy 10 weeks 1 day with fetal heart rate of 142 bpm.  I suspect threatened miscarriage and patient advised pelvic rest and close follow-up with her OB/GYN.  PVL of the right lower extremity is negative for DVT.  She is encouraged to  elevate her legs to help with any swelling and she may take over-the-counter Tylenol as directed as needed for pain.  EKG and chest x-ray  appear unremarkable.  Again I suspect musculoskeletal chest wall pain.  She [facility]  which she got last week  for her back pain that she did not get filled.  She is advised that this is safe to take during pregnancy.  Again advised to take over-the-counter Tylenol to help as needed for any chest wall pain.  Patient was given IV Reglan  and IV fluids hydration which she tolerated well.  She had no recurrent episodes of vomiting during her stay and nausea was improved.  She was able to tolerate oral fluids without difficulty.  We will discharge home with a prescription for Reglan.  She  is encouraged to follow-up closely with OB/GYN.  Patient advised to return to the ED for new or worsening symptoms.     Medications       sodium chloride 0.9 % bolus infusion 1,000 mL (0 mL IntraVENous IV Completed 09/25/17 0012)       metoclopramide HCl (REGLAN) injection 10 mg (10 mg IntraVENous Given 09/24/17 2138)          Final Diagnosis                  ICD-10-CM  ICD-9-CM          1.  Threatened miscarriage  O20.0  640.00     2.  Non-intractable vomiting with nausea, unspecified vomiting type  R11.2  787.01     3.  Chest wall pain  R07.89  786.52     4.  Pain and swelling of right lower extremity  M79.604  729.5           M79.89  729.81             Disposition     Patient stable for discharge home           Discharge Medication List as of 09/25/2017  1:19 AM              START taking these medications          Details        metoclopramide HCl (REGLAN) 10 mg tablet  Take 1 Tab by mouth every six (6) hours as needed for Nausea for up to 10 days., Print, Disp-12 Tab, R-0                     CONTINUE these medications which have NOT CHANGED          Details        cyclobenzaprine (FLEXERIL) 10 mg tablet  Take 1 Tab by mouth three (3) times daily as needed for Muscle Spasm(s)., Print, Disp-12 Tab, R-0               traZODone (DESYREL) 100 mg tablet  Take 200 mg by mouth nightly., Historical Med               levothyroxine (SYNTHROID) 75 mcg tablet  Take 1 Tab by mouth Daily (before breakfast). Indications: HYPOTHYROIDISM, Normal, Disp-30 Tab,  R-2                      The patient was fully discussed with Gwenyth AllegraNELSON, TIMOTHY S, MD who agrees with the above assessment and plan         Luna Fuseshley Osamah Schmader PA-C   September 25, 2017      My signature above authenticates this document and my orders, the final ??   diagnosis (es),  discharge prescription (s), and instructions in the Epic ??   record.   If you have any questions please contact [phone].   ??   Nursing notes have been reviewed by the physician/ advanced practice ??   Clinician.

## 2017-09-24 NOTE — ED Notes (Signed)
PVL in progress at this time.

## 2017-09-24 NOTE — ED Notes (Signed)
Care assummed and pt in room eating chips with family

## 2017-09-25 ENCOUNTER — Inpatient Hospital Stay
Admit: 2017-09-25 | Discharge: 2017-09-25 | Disposition: A | Discharge status: Home / Self Care | Payer: MEDICAID | Attending: Emergency Medicine

## 2017-09-25 LAB — POC URINE MACROSCOPIC
Bilirubin, Urine: NEGATIVE
Bilirubin: NEGATIVE
Blood, Urine: NEGATIVE
Blood: NEGATIVE
Glucose, Ur: NEGATIVE mg/dl
Glucose: NEGATIVE mg/dl
Ketone: NEGATIVE mg/dl
Ketones, Urine: NEGATIVE mg/dl
Leukocyte Esterase, Urine: NEGATIVE
Leukocyte Esterase: NEGATIVE
Nitrite, Urine: NEGATIVE
Nitrites: NEGATIVE
Protein, UA: 30 mg/dl — AB
Protein: 30 mg/dl — AB
Specific Gravity, UA: 1.025 (ref 1.005–1.030)
Specific gravity: 1.025 (ref 1.005–1.030)
Urobilinogen, UA, POCT: 0.2 EU/dl (ref 0.0–1.0)
Urobilinogen: 0.2 EU/dl (ref 0.0–1.0)
pH (UA): 6.5 (ref 5–9)
pH, UA: 6.5 (ref 5–9)

## 2017-09-25 LAB — WET PREP

## 2017-09-25 LAB — EKG, 12 LEAD, INITIAL
Atrial Rate: 75 {beats}/min
Calculated P Axis: 42 degrees
Calculated R Axis: 55 degrees
Calculated T Axis: -5 degrees
Diagnosis: NORMAL
P-R Interval: 156 ms
Q-T Interval: 380 ms
QRS Duration: 98 ms
QTC Calculation (Bezet): 424 ms
Ventricular Rate: 75 {beats}/min

## 2017-09-25 LAB — POC HCG,URINE
HCG urine, QL: POSITIVE — AB
Pregnancy Test(Urn): POSITIVE — AB

## 2017-09-25 LAB — BETA HCG, QT
HCG, BETA, HCGTLT: 57020 m[IU]/mL
HCG, beta, QT: 57020 m[IU]/mL

## 2017-09-25 LAB — CT/GC DNA
CHLAMYDIA TRACHOMATIS DNA, CTDNA: NOT DETECTED
CHLAMYDIA TRACHOMATIS DNA: NOT DETECTED
NEISSERIA GONORRHOEAE DNA, GCDNA: NOT DETECTED
NEISSERIA GONORRHOEAE DNA: NOT DETECTED

## 2017-09-25 LAB — EKG 12-LEAD
Atrial Rate: 75 {beats}/min
Diagnosis: NORMAL
P Axis: 42 degrees
P-R Interval: 156 ms
Q-T Interval: 380 ms
QRS Duration: 98 ms
QTc Calculation (Bazett): 424 ms
R Axis: 55 degrees
T Axis: -5 degrees
Ventricular Rate: 75 {beats}/min

## 2017-09-25 MED ORDER — SODIUM CHLORIDE 0.9% BOLUS IV
0.9 % | INTRAVENOUS | Status: AC
Start: 2017-09-25 — End: 2017-09-25
  Administered 2017-09-25: 02:00:00 via INTRAVENOUS

## 2017-09-25 MED ORDER — METOCLOPRAMIDE 10 MG TAB
10 mg | ORAL_TABLET | Freq: Four times a day (QID) | ORAL | 0 refills | Status: AC | PRN
Start: 2017-09-25 — End: 2017-10-05

## 2017-09-25 MED ORDER — METOCLOPRAMIDE 5 MG/ML IJ SOLN
5 mg/mL | INTRAMUSCULAR | Status: AC
Start: 2017-09-25 — End: 2017-09-24
  Administered 2017-09-25: 02:00:00 via INTRAVENOUS

## 2017-09-25 MED FILL — METOCLOPRAMIDE 5 MG/ML IJ SOLN: 5 mg/mL | INTRAMUSCULAR | Qty: 2

## 2017-09-25 NOTE — ED Notes (Signed)
Pt to ultra sound in no distress via streacher

## 2017-09-25 NOTE — ED Notes (Signed)
She eats with out c/o abd pain or vomiting. Pt on phone on way to ultra sound
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
## 2017-09-25 NOTE — ED Notes (Signed)
Per pt "im bout tired of tis tell that doctor im ready to go." pt is in no distress pending ultra sound.

## 2017-09-25 NOTE — ED Notes (Signed)
Pt is d/c with written verbal instuctions given and vss pt is in no distress d/c with steady gait

## 2017-09-25 NOTE — ED Notes (Signed)
 Per pt im bout tired of tis tell that doctor im ready to go. pt is in no distress pending ultra sound.

## 2017-09-25 NOTE — ED Notes (Signed)
Pt is d/c with written verbal instuctions given and vss pt is in no distress d/c with steady gait

## 2017-09-25 NOTE — ED Notes (Signed)
Pt c/o cold feet staff walks to [place] sound to bring pt socks at this time.

## 2017-09-25 NOTE — ED Notes (Signed)
She eats with out c/o abd pain or vomiting. Pt on phone on way to ultra sound

## 2017-09-25 NOTE — ED Notes (Signed)
Pt to ultra sound in no distress via streacher
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
## 2017-10-09 ENCOUNTER — Emergency Department: Admit: 2017-10-10 | Payer: MEDICAID

## 2017-10-09 ENCOUNTER — Inpatient Hospital Stay
Admit: 2017-10-09 | Discharge: 2017-10-17 | Disposition: A | Discharge status: Home / Self Care | Payer: MEDICAID | Attending: Internal Medicine | Admitting: Internal Medicine

## 2017-10-09 DIAGNOSIS — O2301 Infections of kidney in pregnancy, first trimester: Secondary | ICD-10-CM

## 2017-10-09 LAB — METABOLIC PANEL, COMPREHENSIVE
ALT (SGPT): 25 U/L (ref 12–78)
AST (SGOT): 31 U/L (ref 15–37)
Albumin: 3.2 gm/dl — ABNORMAL LOW (ref 3.4–5.0)
Alk. phosphatase: 68 U/L (ref 45–117)
Anion gap: 7 mmol/L (ref 5–15)
BUN: 5 mg/dl — ABNORMAL LOW (ref 7–25)
Bilirubin, total: 0.4 mg/dl (ref 0.2–1.0)
CO2: 26 mEq/L (ref 21–32)
Calcium: 9.1 mg/dl (ref 8.5–10.1)
Chloride: 101 mEq/L (ref 98–107)
Creatinine: 0.6 mg/dl (ref 0.6–1.3)
GFR est AA: >60
GFR est non-AA: >60
Glucose: 88 mg/dl (ref 74–106)
Potassium: 2.9 mEq/L — CL (ref 3.5–5.1)
Protein, total: 7.6 gm/dl (ref 6.4–8.2)
Sodium: 135 mEq/L — ABNORMAL LOW (ref 136–145)

## 2017-10-09 LAB — CBC WITH AUTOMATED DIFF
BASOPHILS: 0.3 % (ref 0–3)
EOSINOPHILS: 0.1 % (ref 0–5)
HCT: 33.8 % — ABNORMAL LOW (ref 37.0–50.0)
HGB: 11.7 gm/dl — ABNORMAL LOW (ref 13.0–17.2)
IMMATURE GRANULOCYTES: 0.3 % (ref 0.0–3.0)
LYMPHOCYTES: 16.5 % — ABNORMAL LOW (ref 28–48)
MCH: 25.4 pg (ref 25.4–34.6)
MCHC: 34.6 gm/dl (ref 30.0–36.0)
MCV: 73.3 fL — ABNORMAL LOW (ref 80.0–98.0)
MONOCYTES: 8.4 % (ref 1–13)
MPV: 9.2 fL (ref 6.0–10.0)
NEUTROPHILS: 74.4 % — ABNORMAL HIGH (ref 34–64)
NRBC: 0 (ref 0–0)
PLATELET: 229 10*3/uL (ref 140–450)
RBC: 4.61 M/uL (ref 3.60–5.20)
RDW-SD: 39.3 (ref 36.4–46.3)
WBC: 8 10*3/uL (ref 4.0–11.0)

## 2017-10-09 LAB — CBC WITH AUTO DIFFERENTIAL
Basophils %: 0.3 % (ref 0–3)
Eosinophils %: 0.1 % (ref 0–5)
Hematocrit: 33.8 % — ABNORMAL LOW (ref 37.0–50.0)
Hemoglobin: 11.7 gm/dl — ABNORMAL LOW (ref 13.0–17.2)
Immature Granulocytes: 0.3 % (ref 0.0–3.0)
Lymphocytes %: 16.5 % — ABNORMAL LOW (ref 28–48)
MCH: 25.4 pg (ref 25.4–34.6)
MCHC: 34.6 gm/dl (ref 30.0–36.0)
MCV: 73.3 fL — ABNORMAL LOW (ref 80.0–98.0)
MPV: 9.2 fL (ref 6.0–10.0)
Monocytes %: 8.4 % (ref 1–13)
Neutrophils %: 74.4 % — ABNORMAL HIGH (ref 34–64)
Nucleated RBCs: 0 (ref 0–0)
Platelets: 229 10*3/uL (ref 140–450)
RBC: 4.61 M/uL (ref 3.60–5.20)
RDW-SD: 39.3 (ref 36.4–46.3)
WBC: 8 10*3/uL (ref 4.0–11.0)

## 2017-10-09 LAB — COMPREHENSIVE METABOLIC PANEL
ALT: 25 U/L (ref 12–78)
AST: 31 U/L (ref 15–37)
Albumin: 3.2 gm/dl — ABNORMAL LOW (ref 3.4–5.0)
Alkaline Phosphatase: 68 U/L (ref 45–117)
Anion Gap: 7 mmol/L (ref 5–15)
BUN: 5 mg/dl — ABNORMAL LOW (ref 7–25)
CO2: 26 mEq/L (ref 21–32)
Calcium: 9.1 mg/dl (ref 8.5–10.1)
Chloride: 101 mEq/L (ref 98–107)
Creatinine: 0.6 mg/dl (ref 0.6–1.3)
EGFR IF NonAfrican American: >60
GFR African American: >60
Glucose: 88 mg/dl (ref 74–106)
Potassium: 2.9 mEq/L — CL (ref 3.5–5.1)
Sodium: 135 mEq/L — ABNORMAL LOW (ref 136–145)
Total Bilirubin: 0.4 mg/dl (ref 0.2–1.0)
Total Protein: 7.6 gm/dl (ref 6.4–8.2)

## 2017-10-09 MED ORDER — SODIUM CHLORIDE 0.9% BOLUS IV
0.9 % | INTRAVENOUS | Status: AC
Start: 2017-10-09 — End: 2017-10-09
  Administered 2017-10-10: via INTRAVENOUS

## 2017-10-09 MED ORDER — ONDANSETRON (PF) 4 MG/2 ML INJECTION
4 mg/2 mL | Freq: Once | INTRAMUSCULAR | Status: AC
Start: 2017-10-09 — End: 2017-10-09
  Administered 2017-10-10: via INTRAVENOUS

## 2017-10-09 MED ORDER — SODIUM CHLORIDE 0.9 % IJ SYRG
Freq: Once | INTRAMUSCULAR | Status: AC
Start: 2017-10-09 — End: 2017-10-09
  Administered 2017-10-10: via INTRAVENOUS

## 2017-10-09 MED ORDER — CEFTRIAXONE 1 GRAM SOLUTION FOR INJECTION
1 gram | INTRAMUSCULAR | Status: AC
Start: 2017-10-09 — End: 2017-10-09
  Administered 2017-10-10: via INTRAVENOUS

## 2017-10-09 MED ORDER — POTASSIUM CHLORIDE SR 20 MEQ TAB, PARTICLES/CRYSTALS
20 mEq | ORAL | Status: AC
Start: 2017-10-09 — End: 2017-10-09
  Administered 2017-10-10: via ORAL

## 2017-10-09 MED ORDER — MORPHINE 4 MG/ML SYRINGE
4 mg/mL | INTRAMUSCULAR | Status: AC
Start: 2017-10-09 — End: 2017-10-09
  Administered 2017-10-10: via INTRAVENOUS

## 2017-10-09 NOTE — H&P (Signed)
Medicine History and Physical    Patient: Linda [name] Age: 27 y.o. Sex: female    Date of Birth: January 11, 1991 Admit Date: 10/09/2017 PCP: Lacey Jensen, MD   MRN: [id number]  CSN: [id number]         Assessment   Pyelonephritis   Hypokalemia   Acute Intrauterine Pregnancy       Plan   IVF   Continue Ceftriaxone   Zofran IV  Pain management   GYN consulted   Continue ABX  Follow blood and urine CS  Diet Regular   DVT PPX ambulatory   ACP: CODE STATUS FULL CODE         Chief Complaint:  Chief Complaint   Patient presents with   ??? Nausea   ??? Vomiting   ??? Headache         HPI:   Linda [name] is a 27 y.o. year old female who is [redacted] weeks pregnant presents with diagnosed with pyelonephritis on Tuesday at [facility]. At that time she had back pain and fever. She was d/ced on Keflex however has had N/V not been able to keep meds down.   [place] retroperitoneal shows Normal renal ultrasound.      Review of Systems - 12 Point ROS -ve except what is noted in the HPI.     Past Medical History:  Past Medical History:   Diagnosis Date   ??? ADHD (attention deficit hyperactivity disorder)    ??? Bipolar disorder ([place])    ??? BV (bacterial vaginosis)     On Several Occasions   ??? Chlamydia 04/25/2011    07/07/14, 04/12/14, 02/14/14, 07/03/12, 04/25/11    ??? Depression    ??? Gestational diabetes    ??? Gonorrhea 01/27/2013    07/07/14, 02/14/14, 01/27/13   ??? Headache 03/10/2014    CT Head W/O Contrast: Normal   ??? Hypokalemia 05/22/2011    3.4 mmol/L   ??? Hypomagnesemia 03/09/2014    1.5 mg/dL   ??? Hyponatremia 01/03/2011    132 mmol/L   ??? Hypothyroidism    ??? Microcytic anemia 01/01/2008   ??? Morbid obesity ([place])    ??? OCD (obsessive compulsive disorder)    ??? Recurrent UTI    ??? Sacroiliitis ([place]) 11/12/2011    Noted on Lumbar X-ray: Left Sided Sacroliitis    ??? Strep throat 05/22/2011    + RS   ??? Vision decreased        Past Surgical History:  Past Surgical History:   Procedure Laterality Date   ??? HX DILATION AND CURETTAGE  09/2013        Family History:  Family History   Problem Relation Age of Onset   ??? Hypertension Maternal Grandmother    ??? Thyroid Disease Maternal Grandmother    ??? Diabetes Maternal Grandmother    ??? Hypertension Maternal Grandfather    ??? Hypertension Paternal Grandfather    ??? Hypertension Mother    ??? Asthma Mother    ??? Diabetes Mother    ??? Thyroid Disease Brother    ??? Diabetes Brother    ??? No Known Problems Sister    ??? Diabetes Father    ??? Diabetes Paternal Grandmother    ??? Thyroid Disease Maternal Aunt        Social History:  Social History     Socioeconomic History   ??? Marital status: SINGLE     Spouse name: Not on file   ??? Number of children: Not on file   ???  Years of education: Not on file   ??? Highest education level: Not on file   Tobacco Use   ??? Smoking status: Never Smoker   ??? Smokeless tobacco: Never Used   Substance and Sexual Activity   ??? Alcohol use: Yes   ??? Drug use: Yes     Types: Marijuana   ??? Sexual activity: Yes     Partners: Male     Birth control/protection: None       Home Medications:  Prior to Admission medications    Medication Sig Start Date End Date Taking? Authorizing Provider   cyclobenzaprine (FLEXERIL) 10 mg tablet Take 1 Tab by mouth three (3) times daily as needed for Muscle Spasm(s). 09/14/17   Jenelle Mages, PA-C   traZODone (DESYREL) 100 mg tablet Take 200 mg by mouth nightly.    Other, Phys, MD   levothyroxine (SYNTHROID) 75 mcg tablet Take 1 Tab by mouth Daily (before breakfast). Indications: HYPOTHYROIDISM  Patient taking differently: Take 100 mcg by mouth Daily (before breakfast). Indications: hypothyroidism 07/29/14   Michela Pitcher, MD       Allergies:  Allergies   Allergen Reactions   ??? Percocet [Oxycodone-Acetaminophen] Nausea and Vomiting         Physical Exam:     Visit Vitals  BP 131/67   Pulse 79   Temp 99.1 ??F (37.3 ??C)   Resp 18   Ht 5' 5" (1.651 m)   Wt 149.7 kg (330 lb)   SpO2 100%   BMI 54.91 kg/m??       Physical Exam:   General appearance: alert, cooperative, no distress, appears stated age  Head: Normocephalic, without obvious abnormality, atraumatic  Neck: supple, trachea midline  Lungs: clear to auscultation bilaterally  Heart: regular rate and rhythm, S1, S2 normal, no murmur, click, rub or gallop  Abdomen: soft, right flank tenderness. Bowel sounds normal. No masses,  no organomegaly  Extremities: extremities normal, atraumatic, no cyanosis or edema  Skin: Skin color, texture, turgor normal. No rashes or lesions  Neurologic: Grossly normal        Intake and Output:  Current Shift:  07/25 1901 - 07/26 0700  In: 1050 [I.V.:1050]  Out: -   Last three shifts:  No intake/output data recorded.    Lab/Data Reviewed:  Lab:   Recent Results (from the past 12 hour(s))   CBC WITH AUTOMATED DIFF    Collection Time: 10/09/17  6:23 PM   Result Value Ref Range    WBC 8.0 4.0 - 11.0 1000/mm3    RBC 4.61 3.60 - 5.20 M/uL    HGB 11.7 (L) 13.0 - 17.2 gm/dl    HCT 33.8 (L) 37.0 - 50.0 %    MCV 73.3 (L) 80.0 - 98.0 fL    MCH 25.4 25.4 - 34.6 pg    MCHC 34.6 30.0 - 36.0 gm/dl    PLATELET 229 140 - 450 1000/mm3    MPV 9.2 6.0 - 10.0 fL    RDW-SD 39.3 36.4 - 46.3      NRBC 0 0 - 0      IMMATURE GRANULOCYTES 0.3 0.0 - 3.0 %    NEUTROPHILS 74.4 (H) 34 - 64 %    LYMPHOCYTES 16.5 (L) 28 - 48 %    MONOCYTES 8.4 1 - 13 %    EOSINOPHILS 0.1 0 - 5 %    BASOPHILS 0.3 0 - 3 %   METABOLIC PANEL, COMPREHENSIVE    Collection Time: 10/09/17  6:23 PM   Result Value Ref Range    Sodium 135 (L) 136 - 145 mEq/L    Potassium 2.9 (LL) 3.5 - 5.1 mEq/L    Chloride 101 98 - 107 mEq/L    CO2 26 21 - 32 mEq/L    Glucose 88 74 - 106 mg/dl    BUN 5 (L) 7 - 25 mg/dl    Creatinine 0.6 0.6 - 1.3 mg/dl    GFR est AA >60.0      GFR est non-AA >60      Calcium 9.1 8.5 - 10.1 mg/dl    AST (SGOT) 31 15 - 37 U/L    ALT (SGPT) 25 12 - 78 U/L    Alk. phosphatase 68 45 - 117 U/L    Bilirubin, total 0.4 0.2 - 1.0 mg/dl    Protein, total 7.6 6.4 - 8.2 gm/dl     Albumin 3.2 (L) 3.4 - 5.0 gm/dl    Anion gap 7 5 - 15 mmol/L   LACTIC ACID    Collection Time: 10/09/17  8:00 PM   Result Value Ref Range    Lactic Acid 0.8 0.4 - 2.0 mmol/L   URINALYSIS W/ RFLX MICROSCOPIC    Collection Time: 10/09/17  9:30 PM   Result Value Ref Range    Color YELLOW YELLOW,STRAW      Appearance CLEAR CLEAR      Glucose NEGATIVE NEGATIVE,Negative mg/dl    Bilirubin SMALL (A) NEGATIVE,Negative      Ketone 40 (A) NEGATIVE,Negative mg/dl    Specific gravity 1.020 1.005 - 1.030      Blood NEGATIVE NEGATIVE,Negative      pH (UA) 6.0 5.0 - 9.0      Protein TRACE (A) NEGATIVE,Negative mg/dl    Urobilinogen 4.0 (H) 0.0 - 1.0 mg/dl    Nitrites NEGATIVE NEGATIVE,Negative      Leukocyte Esterase NEGATIVE NEGATIVE,Negative     HCG URINE, QL    Collection Time: 10/09/17  9:30 PM   Result Value Ref Range    HCG urine, QL POSITIVE (A) NEGATIVE     POC URINE MICROSCOPIC    Collection Time: 10/09/17  9:30 PM   Result Value Ref Range    Epithelial cells, squamous 15-29 /LPF    Renal epithelial cells 10-14 /LPF    WBC 5-9 /HPF    RBC OCCASIONAL /HPF    Bacteria 1+ /HPF       Imaging:    [place] Retroperitoneum Comp    Result Date: 10/09/2017  Clinical history: Pyelonephritis, right flank pain EXAMINATION: Renal ultrasound 10/09/2017 FINDINGS: Right kidney measures 11.8 x 4.3 x 4.6 cm. Left kidney measures 11.7 x 6.2 x 5.6 cm. Normal corticomedullary differentiation. No hydronephrosis. Bladder is unremarkable, prevoid bladder volume 140 mL.     IMPRESSION: Normal renal ultrasound.       Rosalene Billings, MD  October 09, 2017

## 2017-10-09 NOTE — ED Notes (Signed)
Pt was seen at [facility] and treated for Nausea and kidney infection. . Pt has D/C home and given medications. Pt has been unable to keep medications down. She is nauseous and has a headache .

## 2017-10-09 NOTE — ED Notes (Addendum)
Pt seen at [facility] on Tuesday and dx'd with pyleonephritis. Currently ~[redacted] weeks pregnant. Pt was told if she returned they would need to keep her. States she is unable to keep food/fluids/medications down. Pt supposed to be taking Keflex and Norco (PRN for pain). Pt has OB here. Was told to come to this ED. Pt very tearful in room.   States the nausea medications are not keeping down. Right greater than left flank pain. Radiating into abdomen.  This 5th pregnancy, 4 living children.

## 2017-10-09 NOTE — ED Notes (Signed)
IV placed to RAC, positional and coban to facilitate the IVF. Labs obtained.

## 2017-10-09 NOTE — ED Provider Notes (Signed)
HPI 27 year old female diagnosed with pyelonephritis on Tuesday at [facility].  There was discussion of admitting the patient but subsequently the patient was discharged with oral Keflex and Norco for pain control and was instructed that if she did not improve she would need to return to the hospital immediately apparently she called the hospital and was instructed to come to the [facility] since she is actually closer to our facility today.  Arrives complaining of severe lower back pain generalized body aches fevers nausea vomiting and weakness.  She states she is [redacted] weeks pregnant has not been able to keep her medications that she was prescribed down because of her vomiting.     Past Medical History:   Diagnosis Date   ??? ADHD (attention deficit hyperactivity disorder)    ??? Bipolar disorder (HCC)    ??? BV (bacterial vaginosis)     On Several Occasions   ??? Chlamydia 04/25/2011    07/07/14, 04/12/14, 02/14/14, 07/03/12, 04/25/11    ??? Depression    ??? Gestational diabetes    ??? Gonorrhea 01/27/2013    07/07/14, 02/14/14, 01/27/13   ??? Headache 03/10/2014    CT Head W/O Contrast: Normal   ??? Hypokalemia 05/22/2011    3.4 mmol/L   ??? Hypomagnesemia 03/09/2014    1.5 mg/dL   ??? Hyponatremia 01/03/2011    132 mmol/L   ??? Hypothyroidism    ??? Microcytic anemia 01/01/2008   ??? Morbid obesity (HCC)    ??? OCD (obsessive compulsive disorder)    ??? Recurrent UTI    ??? Sacroiliitis (HCC) 11/12/2011    Noted on Lumbar X-ray: Left Sided Sacroliitis    ??? Strep throat 05/22/2011    + RS   ??? Vision decreased        Past Surgical History:   Procedure Laterality Date   ??? HX DILATION AND CURETTAGE  09/2013         Family History:   Problem Relation Age of Onset   ??? Hypertension Maternal Grandmother    ??? Thyroid Disease Maternal Grandmother    ??? Diabetes Maternal Grandmother    ??? Hypertension Maternal Grandfather    ??? Hypertension Paternal Grandfather    ??? Hypertension Mother    ??? Asthma Mother    ??? Diabetes Mother     ??? Thyroid Disease Brother    ??? Diabetes Brother    ??? No Known Problems Sister    ??? Diabetes Father    ??? Diabetes Paternal Grandmother    ??? Thyroid Disease Maternal Aunt        Social History     Socioeconomic History   ??? Marital status: SINGLE     Spouse name: Not on file   ??? Number of children: Not on file   ??? Years of education: Not on file   ??? Highest education level: Not on file   Occupational History   ??? Not on file   Social Needs   ??? Financial resource strain: Not on file   ??? Food insecurity:     Worry: Not on file     Inability: Not on file   ??? Transportation needs:     Medical: Not on file     Non-medical: Not on file   Tobacco Use   ??? Smoking status: Never Smoker   ??? Smokeless tobacco: Never Used   Substance and Sexual Activity   ??? Alcohol use: Yes   ??? Drug use: Yes     Types: Marijuana   ???  Sexual activity: Yes     Partners: Male     Birth control/protection: None   Lifestyle   ??? Physical activity:     Days per week: Not on file     Minutes per session: Not on file   ??? Stress: Not on file   Relationships   ??? Social connections:     Talks on phone: Not on file     Gets together: Not on file     Attends religious service: Not on file     Active member of club or organization: Not on file     Attends meetings of clubs or organizations: Not on file     Relationship status: Not on file   ??? Intimate partner violence:     Fear of current or ex partner: Not on file     Emotionally abused: Not on file     Physically abused: Not on file     Forced sexual activity: Not on file   Other Topics Concern   ??? Not on file   Social History Narrative   ??? Not on file         ALLERGIES: Percocet [oxycodone-acetaminophen]    Review of Systems   Constitutional: Positive for chills and fever.   HENT: Negative.    Eyes: Negative.    Respiratory: Negative.    Cardiovascular: Negative.    Gastrointestinal: Positive for abdominal pain, nausea and vomiting.   Genitourinary: Negative.     Musculoskeletal: Positive for back pain and myalgias.   Neurological: Negative.    Hematological: Negative.    Psychiatric/Behavioral: Negative.        Vitals:    10/09/17 1706 10/09/17 1749   BP:  156/77   Pulse:  (!) 119   Resp:  18   Temp:  99.3 ??F (37.4 ??C)   SpO2:  100%   Weight: 149.7 kg (330 lb)    Height: 5\' 5"  (1.651 m)             Physical Exam   Constitutional: She is oriented to person, place, and time. She appears well-developed.   HENT:   Head: Normocephalic and atraumatic.   Eyes: Pupils are equal, round, and reactive to light. EOM are normal.   Neck: Normal range of motion. Neck supple.   Cardiovascular: Regular rhythm.   Tachycardic   Pulmonary/Chest: Effort normal and breath sounds normal.   Abdominal: Soft. There is no tenderness. There is no guarding.   Musculoskeletal: Normal range of motion.   Neurological: She is alert and oriented to person, place, and time.   Skin: Skin is warm and dry.   Psychiatric:   Tearful        MDM  Establish IV infuse normal saline via IV.  Obtain appropriate lab work.  Start sepsis evaluation.  Provide initially 4 mg of Zofran 4 mg of morphine via IV and 1 g of Rocephin IV.  Will discuss patient's case with Dr. Renita PapaWhitted her OB/GYN physician.   All results returned were reviewed and subsequently discussed with Dr. Nelle DonHollander who is on-call for Dr. we did her OB/GYN Dr..  Discussed the case with Dr. Earlie [facility]Jasarevic of [facility].  She has continued to complain of back pain and nausea and had one episode of vomiting.  Given her recent diagnosis of pyelonephritis despite the fact that her test results here are improving she continues to feel worse per the patient and is has been unable to take her oral antibiotics we  felt it was appropriate to admit the patient for further evaluation testing and treatment.     Procedures      Patient be admitted to inpatient status MedSurg bed in stable condition.

## 2017-10-09 NOTE — Progress Notes (Signed)
10-09-2017 Rcvd ER ref via [facility] in-basket for pyelonephritis, [redacted]wks pregnant seen in [facility] ER on 10-07-2017. Tried to call home# on file [phone] but line not accepting calls. Will send letter.-Myrna N

## 2017-10-09 NOTE — Progress Notes (Signed)
10-09-2017 [facility] for [facility]el[facility], [redacted]wks pregnant seen in [facility] ER on 10-07-2017. Tried to call home# on file [phone] but line not accepting calls. Will send letter.-Myrna N

## 2017-10-09 NOTE — H&P (Signed)
Medicine History and Physical    Patient: Linda [name] Age: 27 y.o. Sex: female    Date of Birth: November 02, 1990 Admit Date: 10/09/2017 PCP: Lacey Jensen, MD   MRN: [id number]  CSN: [id number]         Assessment   Pyelonephritis   Hypokalemia   Acute Intrauterine Pregnancy       Plan   IVF   Continue Ceftriaxone   Zofran IV  Pain management   GYN consulted   Continue ABX  Follow blood and urine CS  Diet Regular   DVT PPX ambulatory   ACP: CODE STATUS FULL CODE         Chief Complaint:  Chief Complaint   Patient presents with   ??? Nausea   ??? Vomiting   ??? Headache         HPI:   Linda [name] is a 27 y.o. year old female who is [redacted] weeks pregnant presents with diagnosed with pyelonephritis on Tuesday at [facility]. At that time she had back pain and fever. She was d/ced on Keflex however has had N/V not been able to keep meds down.   [place] retroperitoneal shows Normal renal ultrasound.      Review of Systems - 12 Point ROS -ve except what is noted in the HPI.     Past Medical History:  Past Medical History:   Diagnosis Date   ??? ADHD (attention deficit hyperactivity disorder)    ??? Bipolar disorder ([place])    ??? BV (bacterial vaginosis)     On Several Occasions   ??? Chlamydia 04/25/2011    07/07/14, 04/12/14, 02/14/14, 07/03/12, 04/25/11    ??? Depression    ??? Gestational diabetes    ??? Gonorrhea 01/27/2013    07/07/14, 02/14/14, 01/27/13   ??? Headache 03/10/2014    CT Head W/O Contrast: Normal   ??? Hypokalemia 05/22/2011    3.4 mmol/L   ??? Hypomagnesemia 03/09/2014    1.5 mg/dL   ??? Hyponatremia 01/03/2011    132 mmol/L   ??? Hypothyroidism    ??? Microcytic anemia 01/01/2008   ??? Morbid obesity ([place])    ??? OCD (obsessive compulsive disorder)    ??? Recurrent UTI    ??? Sacroiliitis ([place]) 11/12/2011    Noted on Lumbar X-ray: Left Sided Sacroliitis    ??? Strep throat 05/22/2011    + RS   ??? Vision decreased        Past Surgical History:  Past Surgical History:   Procedure Laterality Date   ??? HX DILATION AND CURETTAGE  09/2013       Family  History:  Family History   Problem Relation Age of Onset   ??? Hypertension Maternal Grandmother    ??? Thyroid Disease Maternal Grandmother    ??? Diabetes Maternal Grandmother    ??? Hypertension Maternal Grandfather    ??? Hypertension Paternal Grandfather    ??? Hypertension Mother    ??? Asthma Mother    ??? Diabetes Mother    ??? Thyroid Disease Brother    ??? Diabetes Brother    ??? No Known Problems Sister    ??? Diabetes Father    ??? Diabetes Paternal Grandmother    ??? Thyroid Disease Maternal Aunt        Social History:  Social History     Socioeconomic History   ??? Marital status: SINGLE     Spouse name: Not on file   ??? Number of children: Not on file   ???  Years of education: Not on file   ??? Highest education level: Not on file   Tobacco Use   ??? Smoking status: Never Smoker   ??? Smokeless tobacco: Never Used   Substance and Sexual Activity   ??? Alcohol use: Yes   ??? Drug use: Yes     Types: Marijuana   ??? Sexual activity: Yes     Partners: Male     Birth control/protection: None       Home Medications:  Prior to Admission medications    Medication Sig Start Date End Date Taking? Authorizing Provider   cyclobenzaprine (FLEXERIL) 10 mg tablet Take 1 Tab by mouth three (3) times daily as needed for Muscle Spasm(s). 09/14/17   Jenelle Mages, PA-C   traZODone (DESYREL) 100 mg tablet Take 200 mg by mouth nightly.    Other, Phys, MD   levothyroxine (SYNTHROID) 75 mcg tablet Take 1 Tab by mouth Daily (before breakfast). Indications: HYPOTHYROIDISM  Patient taking differently: Take 100 mcg by mouth Daily (before breakfast). Indications: hypothyroidism 07/29/14   Michela Pitcher, MD       Allergies:  Allergies   Allergen Reactions   ??? Percocet [Oxycodone-Acetaminophen] Nausea and Vomiting         Physical Exam:     Visit Vitals  BP 131/67   Pulse 79   Temp 99.1 ??F (37.3 ??C)   Resp 18   Ht 5' 5" (1.651 m)   Wt 149.7 kg (330 lb)   SpO2 100%   BMI 54.91 kg/m??       Physical Exam:  General appearance: alert, cooperative, no distress, appears stated  age  Head: Normocephalic, without obvious abnormality, atraumatic  Neck: supple, trachea midline  Lungs: clear to auscultation bilaterally  Heart: regular rate and rhythm, S1, S2 normal, no murmur, click, rub or gallop  Abdomen: soft, right flank tenderness. Bowel sounds normal. No masses,  no organomegaly  Extremities: extremities normal, atraumatic, no cyanosis or edema  Skin: Skin color, texture, turgor normal. No rashes or lesions  Neurologic: Grossly normal        Intake and Output:  Current Shift:  07/25 1901 - 07/26 0700  In: 1050 [I.V.:1050]  Out: -   Last three shifts:  No intake/output data recorded.    Lab/Data Reviewed:  Lab:   Recent Results (from the past 12 hour(s))   CBC WITH AUTOMATED DIFF    Collection Time: 10/09/17  6:23 PM   Result Value Ref Range    WBC 8.0 4.0 - 11.0 1000/mm3    RBC 4.61 3.60 - 5.20 M/uL    HGB 11.7 (L) 13.0 - 17.2 gm/dl    HCT 33.8 (L) 37.0 - 50.0 %    MCV 73.3 (L) 80.0 - 98.0 fL    MCH 25.4 25.4 - 34.6 pg    MCHC 34.6 30.0 - 36.0 gm/dl    PLATELET 229 140 - 450 1000/mm3    MPV 9.2 6.0 - 10.0 fL    RDW-SD 39.3 36.4 - 46.3      NRBC 0 0 - 0      IMMATURE GRANULOCYTES 0.3 0.0 - 3.0 %    NEUTROPHILS 74.4 (H) 34 - 64 %    LYMPHOCYTES 16.5 (L) 28 - 48 %    MONOCYTES 8.4 1 - 13 %    EOSINOPHILS 0.1 0 - 5 %    BASOPHILS 0.3 0 - 3 %   METABOLIC PANEL, COMPREHENSIVE    Collection Time: 10/09/17  6:23 PM   Result Value Ref Range    Sodium 135 (L) 136 - 145 mEq/L    Potassium 2.9 (LL) 3.5 - 5.1 mEq/L    Chloride 101 98 - 107 mEq/L    CO2 26 21 - 32 mEq/L    Glucose 88 74 - 106 mg/dl    BUN 5 (L) 7 - 25 mg/dl    Creatinine 0.6 0.6 - 1.3 mg/dl    GFR est AA >60.0      GFR est non-AA >60      Calcium 9.1 8.5 - 10.1 mg/dl    AST (SGOT) 31 15 - 37 U/L    ALT (SGPT) 25 12 - 78 U/L    Alk. phosphatase 68 45 - 117 U/L    Bilirubin, total 0.4 0.2 - 1.0 mg/dl    Protein, total 7.6 6.4 - 8.2 gm/dl    Albumin 3.2 (L) 3.4 - 5.0 gm/dl    Anion gap 7 5 - 15 mmol/L   LACTIC ACID    Collection Time:  10/09/17  8:00 PM   Result Value Ref Range    Lactic Acid 0.8 0.4 - 2.0 mmol/L   URINALYSIS W/ RFLX MICROSCOPIC    Collection Time: 10/09/17  9:30 PM   Result Value Ref Range    Color YELLOW YELLOW,STRAW      Appearance CLEAR CLEAR      Glucose NEGATIVE NEGATIVE,Negative mg/dl    Bilirubin SMALL (A) NEGATIVE,Negative      Ketone 40 (A) NEGATIVE,Negative mg/dl    Specific gravity 1.020 1.005 - 1.030      Blood NEGATIVE NEGATIVE,Negative      pH (UA) 6.0 5.0 - 9.0      Protein TRACE (A) NEGATIVE,Negative mg/dl    Urobilinogen 4.0 (H) 0.0 - 1.0 mg/dl    Nitrites NEGATIVE NEGATIVE,Negative      Leukocyte Esterase NEGATIVE NEGATIVE,Negative     HCG URINE, QL    Collection Time: 10/09/17  9:30 PM   Result Value Ref Range    HCG urine, QL POSITIVE (A) NEGATIVE     POC URINE MICROSCOPIC    Collection Time: 10/09/17  9:30 PM   Result Value Ref Range    Epithelial cells, squamous 15-29 /LPF    Renal epithelial cells 10-14 /LPF    WBC 5-9 /HPF    RBC OCCASIONAL /HPF    Bacteria 1+ /HPF       Imaging:    [place] Retroperitoneum Comp    Result Date: 10/09/2017  Clinical history: Pyelonephritis, right flank pain EXAMINATION: Renal ultrasound 10/09/2017 FINDINGS: Right kidney measures 11.8 x 4.3 x 4.6 cm. Left kidney measures 11.7 x 6.2 x 5.6 cm. Normal corticomedullary differentiation. No hydronephrosis. Bladder is unremarkable, prevoid bladder volume 140 mL.     IMPRESSION: Normal renal ultrasound.       Rosalene Billings, MD  October 09, 2017

## 2017-10-09 NOTE — ED Notes (Signed)
Pt was [facility] at [facility] and treated for Nausea and kidney infection. . Pt has D/C home and given medications. Pt has been unable to keep medications down. She is nauseous and has a headache .

## 2017-10-09 NOTE — ED Notes (Signed)
IV placed to RAC, positional and coban to facilitate the IVF. Labs obtained.

## 2017-10-09 NOTE — ED Provider Notes (Signed)
ED Provider Notes by Janett Billow, PA-C at 10/09/17 1828                Author: Janett Billow, PA-C  Service: Emergency Medicine  Author Type: Physician Assistant       Filed: 10/10/17 0002  Date of Service: 10/09/17 1828  Status: Attested           Editor: Janett Billow, PA-C (Physician Assistant)  Cosigner: Despina Hick, MD at 10/10/17 [phone]          Attestation signed by Despina Hick, MD at 10/10/17 [phone]          The mid-level provider and I discussed the patient's history, physical exam, differential diagnosis, and ancillary information.  I personally saw and examined  the patient. I have reviewed and agree with the midlevel findings, including all diagnostic interpretations, and plans as written. I was present during the key portions of separately billed procedures.      Additional documentation present in free text note.          Despina Hick, MD                                    HPI 27 year old female diagnosed with pyelonephritis on Tuesday at [facility].  There was discussion of admitting  the patient but subsequently the patient was discharged with oral Keflex and Norco for pain control and was instructed that if she did not improve she would need to return to the hospital immediately apparently she called the hospital and was instructed  to come to the [facility] since she is actually closer to our facility today.  Arrives complaining of severe lower back pain generalized body aches fevers nausea vomiting and weakness.  She states she is [redacted] weeks pregnant has not been able to keep her  medications that she was prescribed down because of her vomiting.         Past Medical History:        Diagnosis  Date         ?  ADHD (attention deficit hyperactivity disorder)       ?  Bipolar disorder (HCC)       ?  BV (bacterial vaginosis)            On Several Occasions         ?  Chlamydia  04/25/2011          07/07/14, 04/12/14, 02/14/14, 07/03/12, 04/25/11          ?  Depression        ?  Gestational diabetes       ?  Gonorrhea  01/27/2013          07/07/14, 02/14/14, 01/27/13         ?  Headache  03/10/2014          CT Head W/O Contrast: Normal         ?  Hypokalemia  05/22/2011          3.4 mmol/L         ?  Hypomagnesemia  03/09/2014          1.5 mg/dL         ?  Hyponatremia  01/03/2011          132 mmol/L         ?  Hypothyroidism       ?  Microcytic anemia  01/01/2008     ?  Morbid obesity (HCC)       ?  OCD (obsessive compulsive disorder)       ?  Recurrent UTI       ?  Sacroiliitis (HCC)  11/12/2011          Noted on Lumbar X-ray: Left Sided Sacroliitis          ?  Strep throat  05/22/2011          + RS         ?  Vision decreased               Past Surgical History:         Procedure  Laterality  Date          ?  HX DILATION AND CURETTAGE    09/2013               Family History:         Problem  Relation  Age of Onset          ?  Hypertension  Maternal Grandmother       ?  Thyroid Disease  Maternal Grandmother       ?  Diabetes  Maternal Grandmother       ?  Hypertension  Maternal Grandfather       ?  Hypertension  Paternal Grandfather       ?  Hypertension  Mother       ?  Asthma  Mother       ?  Diabetes  Mother       ?  Thyroid Disease  Brother       ?  Diabetes  Brother       ?  No Known Problems  Sister       ?  Diabetes  Father       ?  Diabetes  Paternal Grandmother            ?  Thyroid Disease  Maternal Aunt               Social History          Socioeconomic History         ?  Marital status:  SINGLE              Spouse name:  Not on file         ?  Number of children:  Not on file     ?  Years of education:  Not on file     ?  Highest education level:  Not on file       Occupational History        ?  Not on file       Social Needs         ?  Financial resource strain:  Not on file        ?  Food insecurity:              Worry:  Not on file         Inability:  Not on file        ?  Transportation needs:              Medical:  Not on file         Non-medical:  Not on file        Tobacco Use         ?  Smoking status:  Never Smoker     ?  Smokeless tobacco:  Never Used       Substance and Sexual Activity         ?  Alcohol use:  Yes     ?  Drug use:  Yes              Types:  Marijuana         ?  Sexual activity:  Yes              Partners:  Male         Birth control/protection:  None       Lifestyle        ?  Physical activity:              Days per week:  Not on file         Minutes per session:  Not on file         ?  Stress:  Not on file       Relationships        ?  Social connections:              Talks on phone:  Not on file         Gets together:  Not on file         Attends religious service:  Not on file         Active member of club or organization:  Not on file         Attends meetings of clubs or organizations:  Not on file         Relationship status:  Not on file        ?  Intimate partner violence:              Fear of current or ex partner:  Not on file         Emotionally abused:  Not on file         Physically abused:  Not on file         Forced sexual activity:  Not on file        Other Topics  Concern        ?  Not on file       Social History Narrative        ?  Not on file              ALLERGIES: Percocet [oxycodone-acetaminophen]      Review of Systems    Constitutional: Positive for chills and fever .    HENT: Negative.     Eyes: Negative.     Respiratory: Negative.     Cardiovascular: Negative.     Gastrointestinal: Positive for abdominal pain, nausea  and vomiting.    Genitourinary: Negative.     Musculoskeletal: Positive for back pain and myalgias .    Neurological: Negative.     Hematological: Negative.     Psychiatric/Behavioral: Negative.             Vitals:           10/09/17 1706  10/09/17 1749         BP:    156/77     Pulse:    (!) 119     Resp:    18     Temp:    99.3 ??F (37.4 ??C)     SpO2:    100%  Weight:  149.7 kg (330 lb)           Height:  5\' 5"  (1.651 m)                  Physical Exam    Constitutional: She is oriented to person, place, and time.  She appears well-developed.    HENT:    Head: Normocephalic and atraumatic.    Eyes: Pupils are equal, round, and reactive to light. EOM are normal.    Neck: Normal range of motion. Neck supple.    Cardiovascular: Regular rhythm.   Tachycardic    Pulmonary/Chest: Effort normal and breath sounds normal.    Abdominal: Soft. There is no tenderness. There is no guarding.   Musculoskeletal: Normal range of motion.   Neurological: She is alert and oriented  to person, place, and time.    Skin: Skin is warm and dry.   Psychiatric:   Tearful            MDM   Establish IV infuse normal saline via IV.  Obtain appropriate lab work.  Start sepsis evaluation.  Provide initially 4 mg of Zofran 4 mg of morphine via IV and 1 g of Rocephin IV.   Will discuss patient's case with Dr. Renita Papa her OB/GYN physician.    All results returned were reviewed and subsequently discussed with Dr. Nelle Don who is on-call for Dr. we did her OB/GYN Dr..   Discussed the case with Dr. Earlie Lou of [facility] hospitalist group.  She has continued to complain of back pain and nausea and had one episode of vomiting.  Given her recent diagnosis of pyelonephritis despite the fact that her test results here are  improving she continues to feel worse per the patient and is has been unable to take her oral antibiotics we felt it was appropriate to admit the patient for further evaluation testing and treatment.       Procedures         Patient be admitted to inpatient status MedSurg bed in stable condition.

## 2017-10-09 NOTE — ED Notes (Signed)
[facility] and dx'd with pyleonephritis. Currently ~[redacted] weeks pregnant. Pt was told if she returned they would need to keep her. States she is unable to keep food/fluids/medications down. Pt supposed to be taking Keflex and Norco (PRN for pain). Pt has OB here. Was told to come to this ED. Pt very tearful in room.   States the nausea medications are not keeping down. Right greater than left flank pain. Radiating into abdomen.  This 5th pregnancy, 4 living children.

## 2017-10-10 ENCOUNTER — Inpatient Hospital Stay: Payer: MEDICAID

## 2017-10-10 LAB — POC URINE MICROSCOPIC

## 2017-10-10 LAB — URINALYSIS W/ RFLX MICROSCOPIC
Blood, Urine: NEGATIVE
Blood: NEGATIVE
Glucose, Ur: NEGATIVE mg/dl
Glucose: NEGATIVE mg/dl
Ketone: 40 mg/dl — AB
Ketones, Urine: 40 mg/dl — AB
Leukocyte Esterase, Urine: NEGATIVE
Leukocyte Esterase: NEGATIVE
Nitrite, Urine: NEGATIVE
Nitrites: NEGATIVE
Specific Gravity, UA: 1.02 (ref 1.005–1.030)
Specific gravity: 1.02 (ref 1.005–1.030)
Urobilinogen, UA, POCT: 4 mg/dl — ABNORMAL HIGH (ref 0.0–1.0)
Urobilinogen: 4 mg/dl — ABNORMAL HIGH (ref 0.0–1.0)
pH (UA): 6 (ref 5.0–9.0)
pH, UA: 6 (ref 5.0–9.0)

## 2017-10-10 LAB — CULTURE, BLOOD FOR MRSA/SA BY PCR
Blood culture S. aureus by PCR: NEGATIVE
Blood culture, MRSA by PCR: NEGATIVE
Blood culture, MRSA by PCR: NEGATIVE
Blood culture, S. aureus by PCR: NEGATIVE

## 2017-10-10 LAB — HCG URINE, QL
HCG urine, QL: POSITIVE — AB
Pregnancy Test(Urn): POSITIVE — AB

## 2017-10-10 LAB — LACTIC ACID
LACTIC ACID: 0.8 mmol/L (ref 0.4–2.0)
Lactic Acid: 0.8 mmol/L (ref 0.4–2.0)

## 2017-10-10 MED ORDER — SODIUM CHLORIDE 0.9 % IJ SYRG
Freq: Three times a day (TID) | INTRAMUSCULAR | Status: DC
Start: 2017-10-10 — End: 2017-10-17
  Administered 2017-10-10 – 2017-10-17 (×22): via INTRAVENOUS

## 2017-10-10 MED ORDER — ACETAMINOPHEN 325 MG TABLET
325 mg | Freq: Four times a day (QID) | ORAL | Status: DC | PRN
Start: 2017-10-10 — End: 2017-10-10
  Administered 2017-10-10 (×3): via ORAL

## 2017-10-10 MED ORDER — SODIUM CHLORIDE 0.9 % IJ SYRG
INTRAMUSCULAR | Status: DC | PRN
Start: 2017-10-10 — End: 2017-10-17

## 2017-10-10 MED ORDER — LEVOTHYROXINE 50 MCG TAB
50 mcg | ORAL | Status: DC
Start: 2017-10-10 — End: 2017-10-17
  Administered 2017-10-10 – 2017-10-17 (×8): via ORAL

## 2017-10-10 MED ORDER — NITROFURANTOIN (25% MACROCRYSTAL FORM) 100 MG CAP
100 mg | Freq: Once | ORAL | Status: AC
Start: 2017-10-10 — End: 2017-10-09
  Administered 2017-10-10: 03:00:00 via ORAL

## 2017-10-10 MED ORDER — NALOXONE 0.4 MG/ML INJECTION
0.4 mg/mL | INTRAMUSCULAR | Status: DC | PRN
Start: 2017-10-10 — End: 2017-10-17

## 2017-10-10 MED ORDER — ONDANSETRON (PF) 4 MG/2 ML INJECTION
4 mg/2 mL | Freq: Four times a day (QID) | INTRAMUSCULAR | Status: DC | PRN
Start: 2017-10-10 — End: 2017-10-17
  Administered 2017-10-10 – 2017-10-14 (×3): via INTRAVENOUS

## 2017-10-10 MED ORDER — TRAZODONE 100 MG TAB
100 mg | Freq: Once | ORAL | Status: AC
Start: 2017-10-10 — End: 2017-10-10
  Administered 2017-10-10: 06:00:00 via ORAL

## 2017-10-10 MED ORDER — SODIUM CHLORIDE 0.9 % IV
INTRAVENOUS | Status: AC
Start: 2017-10-10 — End: 2017-10-10
  Administered 2017-10-10 (×3): via INTRAVENOUS

## 2017-10-10 MED ORDER — MORPHINE 4 MG/ML SYRINGE
4 mg/mL | INTRAMUSCULAR | Status: AC
Start: 2017-10-10 — End: 2017-10-09
  Administered 2017-10-10: 03:00:00 via INTRAVENOUS

## 2017-10-10 MED ORDER — CEFTRIAXONE 1 GRAM SOLUTION FOR INJECTION
1 gram | INTRAMUSCULAR | Status: AC
Start: 2017-10-10 — End: 2017-10-15
  Administered 2017-10-11 – 2017-10-15 (×5): via INTRAVENOUS

## 2017-10-10 MED FILL — MORPHINE 4 MG/ML SYRINGE: 4 mg/mL | INTRAMUSCULAR | Qty: 1

## 2017-10-10 MED FILL — ACETAMINOPHEN 325 MG TABLET: 325 mg | ORAL | Qty: 2

## 2017-10-10 MED FILL — SODIUM CHLORIDE 0.9 % IV: INTRAVENOUS | Qty: 1000

## 2017-10-10 MED FILL — LEVOTHYROXINE 25 MCG TAB: 25 mcg | ORAL | Qty: 1

## 2017-10-10 MED FILL — POTASSIUM CHLORIDE SR 20 MEQ TAB, PARTICLES/CRYSTALS: 20 mEq | ORAL | Qty: 1

## 2017-10-10 MED FILL — TRAZODONE 100 MG TAB: 100 mg | ORAL | Qty: 2

## 2017-10-10 MED FILL — ONDANSETRON (PF) 4 MG/2 ML INJECTION: 4 mg/2 mL | INTRAMUSCULAR | Qty: 2

## 2017-10-10 MED FILL — CEFTRIAXONE 1 GRAM SOLUTION FOR INJECTION: 1 gram | INTRAMUSCULAR | Qty: 1

## 2017-10-10 MED FILL — NITROFURANTOIN (25% MACROCRYSTAL FORM) 100 MG CAP: 100 mg | ORAL | Qty: 1

## 2017-10-10 NOTE — Progress Notes (Signed)
Patient admitted on 10/09/2017 from home with Nausea and Vomiting   Chief Complaint   Patient presents with   ??? Nausea   ??? Vomiting   ??? Headache        The patient has been admitted to the hospital 1 times in the past 12 months.    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 No    PCP: Other, Phys, MD TCC referral     Specialists:         Face sheet information, address, contact info and insurance verified Yes Correct address is [address] [place] [id number].     Dialysis Unit/ chair time / access:        [facility]   Any issues getting medications No    DME at home and provider:        O2 at home  provider    Home Environment:    Lives at [address]  [place] [id number]    @[email or web address] @.     Prior to admission open services:         Home Health Agency-         Personal Care Agency-        Extended Emergency Contact Information  Primary Emergency Contact: Kasandra KnudsenEWTON, MARYANNE  Home Phone: [phone]  Relation: Grandparent  Secondary Emergency Contact: Copeland,Felicia (Godmother)   [place]  Home Phone: [phone]  Relation: Other Relative      Transportation:     Sister  will transport home    Therapy Recommendations:  OT :y/n       PT :y/n       SLP :y/n        RT Home O2 Evaluation :y/n         Wound Care: y/n         Change consult (formerly chamberlain) :      Case Management Assessment    ABUSE/NEGLECT SCREENING   Physical Abuse/Neglect: Denies   Sexual Abuse: Denies   Sexual Abuse: Denies   Other Abuse/Issues: Denies          PRIMARY DECISION MAKER    SELF                                CARE MANAGEMENT INTERVENTIONS   Readmission Interview Completed: Not Applicable   PCP Verified by CM: Yes            Mode of Transport at Discharge: BLS       Transition of Care Consult (CM Consult): Discharge Planning           MyChart Signup: No   Discharge Durable Medical Equipment: No   Physical Therapy Consult: No   Occupational Therapy Consult: No   Speech Therapy Consult: No   Current Support Network: Relative's Home   Reason for Referral: DCP Rounds   History Provided By: Patient, Medical Record   Patient Orientation: Alert and Oriented  Cognition: Alert       Previous Living Arrangement: Lives with Family Independent   Home Accessibility: Steps(3 step entry into home )   Prior Functional Level: Independent in ADLs/IADLs   Current Functional Level: Independent in ADLs/IADLs   Primary Language: English   Can patient return to prior living arrangement: Yes   Ability to make needs known:: Good   Family able to assist with home care needs:: Yes               Types of Needs Identified: Disease Management Education, Treatment Education       Confirm Follow Up Transport: Family   Confirm Transport and Arrange: Yes   Plan discussed with Pt/Family/Caregiver: Yes   Freedom of Choice Offered: Yes      DISCHARGE LOCATION   Discharge Placement: Home with family assistance

## 2017-10-10 NOTE — Progress Notes (Signed)
Assessment/Plan:     Pyelonephritis   Hypokalemia   Intrauterine Pregnancy   Nausea vomiting and back pain.  ??  Plan     Continue IVF   Reviewed renal ultrasound, no stone.  Continue Ceftriaxone IV  Zofran IV  Pain control with Tylenol.  GYN consulted, appreciate assist in advance.  Follow blood and urine CS  DVT PPX ambulatory   ACP: CODE STATUS FULL CODE   Continue home regimen for otherwise chronic, stable medical conditions as noted above.    Discussed with patient. Updates regarding diagnose, tests results, prognosis, treatment  of the patient's medical conditions discussed in detail. All questions answered to the satisfaction of those individual(s) who also verbalized understanding of and agreement with the assessment and plan.   Date of anticipated Discharge: Likely in a.m.    EXAM:  GENERAL: in mild distress.   HEENT: Normocephalic and atraumatic head. Oral mucosa moist. No thrush.    RESPIRATORY: No use of accessory muscles of respiration. Bilateral BS present, decreased at bases. No rales or rhonchi.   CARDIOVASCULAR: S1 and S2 present, regular. No murmur, rub, or thrill.   ABDOMEN: Soft and nontender with positive bowel sounds. No organomegaly.   EXTREMITIES: No edema. No calf tenderness.   NEUROLOGICAL: Cranial nerves II through XII grossly intact. No focal weakness.   SKIN: No rash. Skin is warm, dry, and intact.     Subjective:     Patient complaining of back pain and nausea vomiting.      Vitals:    10/10/17 0525 10/10/17 0535 10/10/17 0741 10/10/17 1122   BP: (!) 148/39 144/59 134/59 93/46   Pulse:   69 74   Resp:   16 16   Temp:   97.9 ??F (36.6 ??C) 97.9 ??F (36.6 ??C)   SpO2:   99% 99%   Weight:       Height:           Recent Labs     10/09/17  1823   WBC 8.0   HGB 11.7*   HCT 33.8*   MCV 73.3*   PLT 229     Recent Labs     10/09/17  1823   NA 135*   K 2.9*   CL 101   CO2 26   AGAP 7   CA 9.1    BUN 5*   CREA 0.6   GLU 88     Recent Labs     10/09/17  1823   ALB 3.2*   TP 7.6   SGOT 31   AP 68   TBILI 0.4   ALT 25     Recent Labs     10/09/17  2130   GLUCU NEGATIVE   BILU SMALL*   KETU 40*   SPGRU 1.020   BLDU NEGATIVE   PHU 6.0   PROTU TRACE*   UROU 4.0*   NITU NEGATIVE   LEUKU NEGATIVE      No results for input(s): GLUCPOC in the last 72 hours.      Total of 36 minutes of which more than 50% was spent in coordination of care and counseling (time spent with patient/family face to face, physical exam, reviewing laboratory and imaging investigations, speaking with physicians and nursing staff involved in this patient's care).     Faythe GheeLisa Leean Amezcua D.O.  [facility]

## 2017-10-10 NOTE — Consults (Addendum)
GYN Consult    Name: Linda [name] MRN: [id number]  SSN: [id number]    Date of Birth: 1990/11/04  Age: 27 y.o.  Sex: female        History of Present Illness     Chief complaint:    Linda [name] is an 27 y.o. female [phone] ,[redacted] weeks pregnant  who was admitted with pain in right flank  Diagnosed with pyelonephritis ,she is on rocephin ,she still not feeling relieve from her pain ,she also has nausea and vomiting with her pregnancy ,no fever ,no constipation or diarrhea ,she did not see anybody yet for this pregnancy .      PMH   Past ob history : [id number] all svd ,has gestational diabetes with her last pregnancy .  Past Medical History:   Diagnosis Date   ??? ADHD (attention deficit hyperactivity disorder)    ??? Bipolar disorder (HCC)    ??? BV (bacterial vaginosis)     On Several Occasions   ??? Chlamydia 04/25/2011    07/07/14, 04/12/14, 02/14/14, 07/03/12, 04/25/11    ??? Depression    ??? Gestational diabetes    ??? Gonorrhea 01/27/2013    07/07/14, 02/14/14, 01/27/13   ??? Headache 03/10/2014    CT Head W/O Contrast: Normal   ??? Hypokalemia 05/22/2011    3.4 mmol/L   ??? Hypomagnesemia 03/09/2014    1.5 mg/dL   ??? Hyponatremia 01/03/2011    132 mmol/L   ??? Hypothyroidism    ??? Microcytic anemia 01/01/2008   ??? Morbid obesity (HCC)    ??? OCD (obsessive compulsive disorder)    ??? Recurrent UTI    ??? Sacroiliitis (HCC) 11/12/2011    Noted on Lumbar X-ray: Left Sided Sacroliitis    ??? Strep throat 05/22/2011    + RS   ??? Vision decreased      Past Surgical History:   Procedure Laterality Date   ??? HX DILATION AND CURETTAGE  09/2013        Social History  Social History     Socioeconomic History   ??? Marital status: SINGLE     Spouse name: Not on file   ??? Number of children: Not on file   ??? Years of education: Not on file   ??? Highest education level: Not on file   Occupational History   ??? Not on file   Social Needs   ??? Financial resource strain: Not on file   ??? Food insecurity:     Worry: Not on file     Inability: Not on file    ??? Transportation needs:     Medical: Not on file     Non-medical: Not on file   Tobacco Use   ??? Smoking status: Never Smoker   ??? Smokeless tobacco: Never Used   Substance and Sexual Activity   ??? Alcohol use: Yes   ??? Drug use: Yes     Types: Marijuana   ??? Sexual activity: Yes     Partners: Male     Birth control/protection: None   Lifestyle   ??? Physical activity:     Days per week: Not on file     Minutes per session: Not on file   ??? Stress: Not on file   Relationships   ??? Social connections:     Talks on phone: Not on file     Gets together: Not on file     Attends religious service: Not on file     Active  member of club or organization: Not on file     Attends meetings of clubs or organizations: Not on file     Relationship status: Not on file   ??? Intimate partner violence:     Fear of current or ex partner: Not on file     Emotionally abused: Not on file     Physically abused: Not on file     Forced sexual activity: Not on file   Other Topics Concern   ??? Not on file   Social History Narrative   ??? Not on file     Family History   Problem Relation Age of Onset   ??? Hypertension Maternal Grandmother    ??? Thyroid Disease Maternal Grandmother    ??? Diabetes Maternal Grandmother    ??? Hypertension Maternal Grandfather    ??? Hypertension Paternal Grandfather    ??? Hypertension Mother    ??? Asthma Mother    ??? Diabetes Mother    ??? Thyroid Disease Brother    ??? Diabetes Brother    ??? No Known Problems Sister    ??? Diabetes Father    ??? Diabetes Paternal Grandmother    ??? Thyroid Disease Maternal Aunt       Allergies   Allergen Reactions   ??? Percocet [Oxycodone-Acetaminophen] Nausea and Vomiting           Physical Examination     General Appearance: Alert, appropriate appearance for age. No acute distress, Chest/Respiratory Exam: Normal chest wall and respirations. Clear to auscultation., Cardiovascular Exam: Regular rate and rhythm. S1, S2, no murmur, click, gallop, or rubs., Gastrointestinal Exam: Soft,  non-tender, no masses or organomegaly ,right costo vertebral angel tenderness ., Pelvic Exam Female: deferred ,extrimity : no edema ,none tender calf muscle    Results for Linda [name], Linda [name] (MRN [id number]) as of 10/10/2017 21:18   Ref. Range 10/09/2017 20:00 10/09/2017 20:15 10/09/2017 21:05 10/09/2017 21:30 10/10/2017 17:30   Color Latest Ref Range: YELLOW,STRAW      YELLOW    Appearance Latest Ref Range: CLEAR      CLEAR    Specific gravity Latest Ref Range: 1.005 - 1.030      1.020    pH (UA) Latest Ref Range: 5.0 - 9.0      6.0    Protein Latest Ref Range: NEGATIVE,Negative mg/dl    TRACE (A)    Glucose Latest Ref Range: NEGATIVE,Negative mg/dl    NEGATIVE    Ketone Latest Ref Range: NEGATIVE,Negative mg/dl    40 (A)    Blood Latest Ref Range: NEGATIVE,Negative      NEGATIVE    Bilirubin Latest Ref Range: NEGATIVE,Negative      SMALL (A)    Urobilinogen Latest Ref Range: 0.0 - 1.0 mg/dl    4.0 (H)    Nitrites Latest Ref Range: NEGATIVE,Negative      NEGATIVE    Leukocyte Esterase Latest Ref Range: NEGATIVE,Negative      NEGATIVE    Epithelial cells, squamous Latest Units: /LPF    15-29    WBC Latest Units: /HPF    5-9    RBC Latest Units: /HPF    OCCASIONAL    Bacteria Latest Units: /HPF    1+    Lactic Acid Latest Ref Range: 0.4 - 2.0 mmol/L 0.8       HCG urine, QL Latest Ref Range: NEGATIVE      POSITIVE (A)    Results   US RETROPERITONEUM COMP (Accession [id number]) (Order [id number])  Allergies??     Low: Percocet [Oxycodone-acetaminophen]   Exam Information     Status Exam Begun  Exam Ended    Final [99] 10/09/2017 20:50 10/09/2017 21:05   Result Information     Status: Final result (Exam End: 10/09/2017 21:05) Provider Status: Open   Study Result     Clinical history: Pyelonephritis, right flank pain  ??  EXAMINATION:  Renal ultrasound 10/09/2017  ??  FINDINGS:  Right kidney measures 11.8 x 4.3 x 4.6 cm. Left kidney measures 11.7 x 6.2 x 5.6  cm. Normal corticomedullary differentiation. No hydronephrosis. Bladder is   unremarkable, prevoid bladder volume 140 mL.  ??  IMPRESSION  IMPRESSION:  Normal renal ultrasound.  ??   Study Result     Indication: Spotting. Lower abdominal cramping nausea and vomiting for 3 weeks.  ??  IMPRESSION  IMPRESSION: Living single intrauterine fetus of approximately 10.2 weeks. Right  corpus luteum cyst.  ??  Comment: Sonography of the pelvis was performed with transvaginal technique.  Initial report was rendered by [facility] radiology services.  ??  The uterus measures 14.0 x 8.0 x 7 cm. It harbors a single living intrauterine  fetus. The crown-rump length measures 10.1 weeks. Gestational sac measures 10.3  weeks for overall sonographic dating of 10.2 weeks. The fetal heart rate is 142  bpm. Yolk sac measures 3 mm.  ??  The right ovary measures 4.6 x 3.9 x 2.8 cm and harbors a 2 cm hemorrhagic  corpus luteum cyst.  ??  The left ovary measures 3.7 x 2.5 x 2.6 cm and is sonographically unremarkable.  ??  No free fluid..  ??   Imaging     [place] UTS TRANSVAGINAL OB (Order: [id number]) - 09/24/2017   Result History     [place] UTS TRANSVAGINAL OB (Order #[id number]) on 09/25/2017 - Order Result History Report      ??   External Results Report   Signed by     Signed Date/Time  Phone Pager   Dirk Dress D III 09/25/2017 05:36 [phone]    IR Summary Links     IR Procedure Log   PACS Images     ??Show images for [place] UTS TRANSVAGINAL OB   Order Report      Order Details   Chart Review Routing History     Recipient Method Report Sent By Donna Bernard   [facility]   Fax: [phone]    Fax [facility] IP AMB RESULT REPORT IMAGING Abagail Kitchens [[id number]] 12/10/2014 11:31 PM 12/10/2014        [facility] IP AMB RESULT REPORT IMAGING Abagail Kitchens [[id number]] 12/10/2014 11:31 PM 12/10/2014        [facility] IP AMB RESULT REPORT IMAGING Abagail Kitchens [[id number]] 12/10/2014 11:31 PM 12/10/2014         Assessment      26 yo [id number] 12 WEEKS IUP with right pyelonephritis  ,morbid obesity     Plan     Continue  iv rocephin   Vicodin prn pain    promethazine for nausea and vomiting   scd   Brat diet         Electronically signed by:  Leonette Nutting, MD  10/10/2017

## 2017-10-10 NOTE — Other (Signed)
Bld cx noted in micro/lab

## 2017-10-10 NOTE — Progress Notes (Signed)
NUTRITION RECOMMENDATIONS:   Gestational DM 2000 calorie diet with Ensure Compact bid         NUTRITION INITIAL EVALUATION    NUTRITION ASSESSMENT:       Reason for assessment: MST    Admitting diagnosis: Intractable vomiting with nausea [R11.2]  Back pain [M54.9]      PMH:   Past Medical History:   Diagnosis Date   ??? ADHD (attention deficit hyperactivity disorder)    ??? Bipolar disorder (HCC)    ??? BV (bacterial vaginosis)     On Several Occasions   ??? Chlamydia 04/25/2011    07/07/14, 04/12/14, 02/14/14, 07/03/12, 04/25/11    ??? Depression    ??? Gestational diabetes    ??? Gonorrhea 01/27/2013    07/07/14, 02/14/14, 01/27/13   ??? Headache 03/10/2014    CT Head W/O Contrast: Normal   ??? Hypokalemia 05/22/2011    3.4 mmol/L   ??? Hypomagnesemia 03/09/2014    1.5 mg/dL   ??? Hyponatremia 01/03/2011    132 mmol/L   ??? Hypothyroidism    ??? Microcytic anemia 01/01/2008   ??? Morbid obesity (HCC)    ??? OCD (obsessive compulsive disorder)    ??? Recurrent UTI    ??? Sacroiliitis (HCC) 11/12/2011    Noted on Lumbar X-ray: Left Sided Sacroliitis    ??? Strep throat 05/22/2011    + RS   ??? Vision decreased         Anthropometrics:  Height:   Ht Readings from Last 3 Encounters:   10/09/17 5\' 5"  (1.651 m)   09/24/17 5\' 5"  (1.651 m)   09/14/17 5\' 5"  (1.651 m) ??       Weight:   Wt Readings from Last 3 Encounters:   10/09/17 149.7 kg (330 lb)   09/24/17 149.7 kg (330 lb)   09/14/17 158.8 kg (350 lb) ??       ?? IBW: 57 kg     ?? % IBW: 263%    ?? BMI: Body mass index is 54.91 kg/m??.    ?? UBW: 159 kg    ?? Wt change: 6% wt loss within 1 months time due to n/v   Diet and intake history:  ?? Current diet order: DIET REGULAR    ?? Food allergies: none    ?? Diet/intake history: Pt reports poor appetite due to n/v. Pt states she eats small meals and sometime will just snack on crackers or fruit when she can.         [x]  <50% intake x >5 days      []  <50% intake x >1 month        []  <75% intake x >7 days       []  <75% intake x 1 month       []  <75% intake x 3 months     ?? Current appetite/PO intake: poor     Assessment of current MNT: Diet is adequate if intake averages 75% at most meals   ??     ?? Cultural, religious, and ethnic food preferences identified: N/A   Physical Assessment:  ?? GI symptoms: no nausea this am with drinking juice.     ?? Chewing/swallowing issues: none    ?? Fluid accumulation: none    ?? Mental status: A &O  X 3   Intake and output:    Intake/Output Summary (Last 24 hours) at 10/10/2017 1013  Last data filed at 10/10/2017 [id number]  Gross per 24 hour   Intake 1576.67  ml   Output 500 ml   Net 1076.67 ml            Living situation: family    Current pertinent medications: Zofran    Pertinent labs: (7/25) glu 88 (wnl), K 2.9 (K-DUR)    NUTRITION DIAGNOSIS:     1. Unintentional wt loss related to pyelonephritis/n/vt as evidenced by 6% wt loss .      NUTRITION INTERVENTION:     Recommended diet: Gestational DM 2000 cal diet   Prenatal vitamin daily    Recommended nutrition supplement: Ensure Compact bid    NUTRITION MONITORING AND EVALUATION:     Nutrition level of care: moderate    Nutrition monitoring: PO intake, diet tolerance and compliance, wt, BS, CMP, hydration, medical changes    Nutrition goals: PO intake >75% without n/v, nutrition related labs WNL, stable wt during  LOS       NUTRITION EDUCATION:   Encouraged po intake of meals & supplements     Roxie Leticia Clas, RD  10/10/17

## 2017-10-10 NOTE — Progress Notes (Signed)
PAGER ID: [phone]   MESSAGE: Pt. in room 4213 Linda [name], Pt. of Dr. Earlie LouJasarevic. Pt. asking for trazadone. Patient states she takes 200mg  every night. [name] [phone]

## 2017-10-10 NOTE — Other (Signed)
PAGER ID: [phone]   MESSAGE: Susie please call me r/t bld cx drawn on pt in [address] [place]. Adrienne [phone]

## 2017-10-10 NOTE — Other (Signed)
Bedside and Verbal shift change report given to Sarah Coldiron, RN (oncoming nurse) by Courtney, RN (offgoing nurse). Report included the following information SBAR, Kardex, Intake/Output, MAR and Recent Results.

## 2017-10-10 NOTE — Other (Signed)
Bedside and Verbal shift change report given to Peter Raymond Ortiz Obaldo, RN   (oncoming nurse) by Sarah RN (offgoing nurse). Report included the following information SBAR and Kardex.

## 2017-10-10 NOTE — ED Notes (Signed)
TRANSFER - OUT REPORT:    Verbal report given to Courtney(name) on Linda [name]  being transferred to [facility](unit) for routine progression of care       Report consisted of patient???s Situation, Background, Assessment and   Recommendations(SBAR).     Information from the following report(s) SBAR, MAR and Recent Results was reviewed with the receiving nurse.    Lines:   Peripheral IV 10/09/17 Right Antecubital (Active)   Site Assessment Clean, dry, & intact 10/09/2017  6:20 PM   Phlebitis Assessment 0 10/09/2017  6:20 PM   Infiltration Assessment 0 10/09/2017  6:20 PM   Dressing Type Transparent;Tape 10/09/2017  6:20 PM   Hub Color/Line Status Positional;Flushed 10/09/2017  6:20 PM   Action Taken Blood drawn 10/09/2017  6:20 PM   Alcohol Cap Used No 10/09/2017  6:20 PM        Opportunity for questions and clarification was provided.      Patient transported with:   Tech

## 2017-10-10 NOTE — ED Notes (Signed)
The mid-level provider and I discussed the patient's history, physical exam, differential diagnosis, and ancillary information.  I personally saw and examined the patient. I have reviewed and agree with the midlevel findings, including all diagnostic interpretations, and plans as written. I was present during the key portions of separately billed procedures.    Patient with a diagnosis of pyelonephritis, [redacted] weeks pregnant, as diet intolerance frequent vomiting unable to keep her antibiotics down, here she is afebrile and not tachycardic saturation normal on room air workup here was unremarkable.  Has persistent vomiting on and will tolerate by mouth intake so will admit for further management

## 2017-10-10 NOTE — Consults (Signed)
Consults by Christell Faith  N at 10/10/17 2103                Author: Leonette Nutting  Service: Obstetrics & Gynecology  Author Type: Physician       Filed: 10/10/17 2131  Date of Service: 10/10/17 2103  Status: Addendum          Editor: Leonette Nutting          Related Notes: Original Note by Leonette Nutting filed at 10/10/17 2122               GYN Consult          Name: Linda [name]  MRN: [id number]   SSN: [id number]          Date of Birth: 1990/12/08   Age: 27 y.o.   Sex: female              History of Present Illness        Chief complaint:    Linda [name] is an  27 y.o. female [phone] ,[redacted] weeks pregnant  who was admitted with pain in right flank  Diagnosed  with pyelonephritis ,she is on rocephin ,she still not feeling relieve from her pain ,she also has nausea and vomiting with her pregnancy ,no fever ,no constipation or diarrhea ,she did not see anybody yet for this pregnancy .          PMH     Past ob history : [id number] all svd ,has gestational diabetes with her last pregnancy .     Past Medical History:        Diagnosis  Date         ?  ADHD (attention deficit hyperactivity disorder)       ?  Bipolar disorder (HCC)       ?  BV (bacterial vaginosis)            On Several Occasions         ?  Chlamydia  04/25/2011          07/07/14, 04/12/14, 02/14/14, 07/03/12, 04/25/11          ?  Depression       ?  Gestational diabetes       ?  Gonorrhea  01/27/2013          07/07/14, 02/14/14, 01/27/13         ?  Headache  03/10/2014          CT Head W/O Contrast: Normal         ?  Hypokalemia  05/22/2011          3.4 mmol/L         ?  Hypomagnesemia  03/09/2014          1.5 mg/dL         ?  Hyponatremia  01/03/2011          132 mmol/L         ?  Hypothyroidism       ?  Microcytic anemia  01/01/2008     ?  Morbid obesity (HCC)       ?  OCD (obsessive compulsive disorder)       ?  Recurrent UTI       ?  Sacroiliitis (HCC)  11/12/2011          Noted on Lumbar X-ray: Left Sided Sacroliitis          ?  Strep throat   05/22/2011          + RS         ?  Vision decreased            Past Surgical History:         Procedure  Laterality  Date          ?  HX DILATION AND CURETTAGE    09/2013            Social History     Social History          Socioeconomic History         ?  Marital status:  SINGLE              Spouse name:  Not on file         ?  Number of children:  Not on file     ?  Years of education:  Not on file     ?  Highest education level:  Not on file       Occupational History        ?  Not on file       Social Needs         ?  Financial resource strain:  Not on file        ?  Food insecurity:              Worry:  Not on file         Inability:  Not on file        ?  Transportation needs:              Medical:  Not on file         Non-medical:  Not on file       Tobacco Use         ?  Smoking status:  Never Smoker     ?  Smokeless tobacco:  Never Used       Substance and Sexual Activity         ?  Alcohol use:  Yes     ?  Drug use:  Yes              Types:  Marijuana         ?  Sexual activity:  Yes              Partners:  Male         Birth control/protection:  None       Lifestyle        ?  Physical activity:              Days per week:  Not on file         Minutes per session:  Not on file         ?  Stress:  Not on file       Relationships        ?  Social connections:              Talks on phone:  Not on file         Gets together:  Not on file         Attends religious service:  Not on file         Active member of club or organization:  Not on file         Attends meetings of clubs  or organizations:  Not on file         Relationship status:  Not on file        ?  Intimate partner violence:              Fear of current or ex partner:  Not on file         Emotionally abused:  Not on file         Physically abused:  Not on file         Forced sexual activity:  Not on file        Other Topics  Concern        ?  Not on file       Social History Narrative        ?  Not on file          Family History         Problem   Relation  Age of Onset          ?  Hypertension  Maternal Grandmother       ?  Thyroid Disease  Maternal Grandmother       ?  Diabetes  Maternal Grandmother       ?  Hypertension  Maternal Grandfather       ?  Hypertension  Paternal Grandfather       ?  Hypertension  Mother       ?  Asthma  Mother       ?  Diabetes  Mother       ?  Thyroid Disease  Brother       ?  Diabetes  Brother       ?  No Known Problems  Sister       ?  Diabetes  Father       ?  Diabetes  Paternal Grandmother            ?  Thyroid Disease  Maternal Aunt             Allergies        Allergen  Reactions         ?  Percocet [Oxycodone-Acetaminophen]  Nausea and Vomiting                   Physical Examination        General Appearance: Alert, appropriate appearance for age. No acute distress, Chest/Respiratory Exam: Normal chest wall and respirations. Clear to auscultation., Cardiovascular Exam: Regular rate and rhythm. S1, S2, no murmur, click, gallop, or rubs.,  Gastrointestinal Exam: Soft, non-tender, no masses or organomegaly ,right costo vertebral angel tenderness ., Pelvic Exam Female: deferred ,extrimity : no edema ,none tender calf muscle     Results for ATHA, MURADYAN (MRN [id number]) as of 10/10/2017 21:18             Ref. Range  10/09/2017 20:00  10/09/2017 20:15  10/09/2017 21:05  10/09/2017 21:30  10/10/2017 17:30     Color  Latest Ref Range: YELLOW,STRAW          YELLOW       Appearance  Latest Ref Range: CLEAR          CLEAR       Specific gravity  Latest Ref Range: 1.005 - 1.030          1.020       pH (UA)  Latest Ref Range: 5.0 - 9.0  6.0       Protein  Latest Ref Range: NEGATIVE,Negative mg/dl        TRACE (A)       Glucose  Latest Ref Range: NEGATIVE,Negative mg/dl        NEGATIVE               Ketone  Latest Ref Range: NEGATIVE,Negative mg/dl        40 (A)               Blood  Latest Ref Range: NEGATIVE,Negative          NEGATIVE       Bilirubin  Latest Ref Range: NEGATIVE,Negative          SMALL (A)       Urobilinogen   Latest Ref Range: 0.0 - 1.0 mg/dl        4.0 (H)       Nitrites  Latest Ref Range: NEGATIVE,Negative          NEGATIVE       Leukocyte Esterase  Latest Ref Range: NEGATIVE,Negative          NEGATIVE       Epithelial cells, squamous  Latest Units: /LPF        15-29       WBC  Latest Units: /HPF        5-9       RBC  Latest Units: /HPF        OCCASIONAL       Bacteria  Latest Units: /HPF        1+       Lactic Acid  Latest Ref Range: 0.4 - 2.0 mmol/L  0.8             HCG urine, QL  Latest Ref Range: NEGATIVE          POSITIVE (A)       Results    US RETROPERITONEUM COMP (Accession [id number]) (Order [id number])    Allergies??         Low: Percocet [Oxycodone-acetaminophen]     Exam Information             Status  Exam Begun    Exam Ended             Final [99]  10/09/2017  20:50  10/09/2017  21:05     Result Information          Status: Final result (Exam End: 10/09/2017 21:05)  Provider Status: Open     Study Result         Clinical history: Pyelonephritis, right flank pain   ??   EXAMINATION:   Renal ultrasound 10/09/2017   ??   FINDINGS:   Right kidney measures 11.8 x 4.3 x 4.6 cm. Left kidney measures 11.7 x 6.2 x 5.6   cm. Normal corticomedullary differentiation. No hydronephrosis. Bladder is   unremarkable, prevoid bladder volume 140 mL.   ??   IMPRESSION   IMPRESSION:   Normal renal ultrasound.   ??     Study Result         Indication: Spotting. Lower abdominal cramping nausea and vomiting for 3 weeks.   ??   IMPRESSION   IMPRESSION: Living single intrauterine fetus of approximately 10.2 weeks. Right   corpus luteum cyst.   ??   Comment: Sonography of the pelvis was performed with transvaginal technique.   [facility]   ??  The uterus measures 14.0 x 8.0 x 7 cm. It harbors a single living intrauterine   fetus. The crown-rump length measures 10.1 weeks. Gestational sac measures 10.3   weeks for overall sonographic dating of 10.2 weeks. The fetal heart rate is 142    bpm. Yolk sac measures 3 mm.   ??   The right ovary measures 4.6 x 3.9 x 2.8 cm and harbors a 2 cm hemorrhagic   corpus luteum cyst.   ??   The left ovary measures 3.7 x 2.5 x 2.6 cm and is sonographically unremarkable.   ??   No free fluid..   ??     Imaging       [place] [facility] TRANSVAGINAL OB (Order: [id number]) - 09/24/2017    Result History         [place] [facility] TRANSVAGINAL OB (Order #[id number]) on 09/25/2017 - Order Result  History Report         ??     External Results Report     Signed by             Signed  Date/Time    Phone  Pager           Dirk Dress D III  09/25/2017  05:36  [phone]       IR Summary Links         IR Procedure Log     PACS Images         ??Show images for [place] [facility] TRANSVAGINAL OB     Order Report          Order Details     Chart Review Routing History              Recipient  Method  Report  Sent By  Donna Bernard     [facility]      Fax: [phone]               Fax  [facility] IP AMB RESULT REPORT IMAGING  Abagail Kitchens [[id number]]  12/10/2014 11:31 PM  12/10/2014               [facility] IP AMB RESULT REPORT IMAGING  Abagail Kitchens [[id number]]  12/10/2014 11:31 PM  12/10/2014               [facility] IP AMB RESULT REPORT IMAGING  Abagail Kitchens [[id number]]  12/10/2014 11:31 PM  12/10/2014                Assessment         26 yo [id number] 12 WEEKS IUP with right pyelonephritis  ,morbid obesity         Plan        Continue  iv rocephin    Vicodin prn pain    promethazine for nausea and vomiting    scd    Brat diet             Electronically signed by:   Leonette Nutting, MD   10/10/2017

## 2017-10-10 NOTE — ED Notes (Signed)
The mid-level provider and I discussed the patient's history, physical exam, differential diagnosis, and ancillary information.  I personally saw and examined the patient. I have reviewed and agree with the midlevel findings, including all diagnostic interpretations, and plans as written. I was present during the key portions of separately billed procedures.    Patient with a diagnosis of pyelonephritis, [redacted] weeks pregnant, as diet intolerance frequent vomiting unable to keep her antibiotics down, here she is afebrile and not tachycardic saturation normal on room air workup here was unremarkable.  Has persistent vomiting on and will tolerate by mouth intake so will admit for further management

## 2017-10-10 NOTE — Progress Notes (Signed)
 NUTRITION RECOMMENDATIONS:   Gestational DM 2000 calorie diet with Ensure Compact bid         NUTRITION INITIAL EVALUATION    NUTRITION ASSESSMENT:       Reason for assessment: MST    Admitting diagnosis: Intractable vomiting with nausea [R11.2]  Back pain [M54.9]      PMH:   Past Medical History:   Diagnosis Date   . ADHD (attention deficit hyperactivity disorder)    . Bipolar disorder (HCC)    . BV (bacterial vaginosis)     On Several Occasions   . Chlamydia 04/25/2011    07/07/14, 04/12/14, 02/14/14, 07/03/12, 04/25/11    . Depression    . Gestational diabetes    . Gonorrhea 01/27/2013    07/07/14, 02/14/14, 01/27/13   . Headache 03/10/2014    CT Head W/O Contrast: Normal   . Hypokalemia 05/22/2011    3.4 mmol/L   . Hypomagnesemia 03/09/2014    1.5 mg/dL   . Hyponatremia 01/03/2011    132 mmol/L   . Hypothyroidism    . Microcytic anemia 01/01/2008   . Morbid obesity (HCC)    . OCD (obsessive compulsive disorder)    . Recurrent UTI    . Sacroiliitis (HCC) 11/12/2011    Noted on Lumbar X-ray: Left Sided Sacroliitis    . Strep throat 05/22/2011    + RS   . Vision decreased         Anthropometrics:  Height:   Ht Readings from Last 3 Encounters:   10/09/17 5' 5 (1.651 m)   09/24/17 5' 5 (1.651 m)   09/14/17 5' 5 (1.651 m)        Weight:   Wt Readings from Last 3 Encounters:   10/09/17 149.7 kg (330 lb)   09/24/17 149.7 kg (330 lb)   09/14/17 158.8 kg (350 lb)         IBW: 57 kg      % IBW: 263%     BMI: Body mass index is 54.91 kg/m.     UBW: 159 kg     Wt change: 6% wt loss within 1 months time due to n/v   Diet and intake history:   Current diet order: DIET REGULAR     Food allergies: none     Diet/intake history: Pt reports poor appetite due to n/v. Pt states she eats small meals and sometime will just snack on crackers or fruit when she can.         [x]  <50% intake x >5 days      []  <50% intake x >1 month        []  <75% intake x >7 days       []  <75% intake x 1 month       []  <75% intake x 3  months     Current appetite/PO intake: poor     Assessment of current MNT: Diet is adequate if intake averages 75% at most meals         Cultural, religious, and ethnic food preferences identified: N/A   Physical Assessment:   GI symptoms: no nausea this am with drinking juice.      Chewing/swallowing issues: none     Fluid accumulation: none     Mental status: A &O  X 3   Intake and output:    Intake/Output Summary (Last 24 hours) at 10/10/2017 1013  Last data filed at 10/10/2017 9366  Gross per 24 hour   Intake 1576.67  ml   Output 500 ml   Net 1076.67 ml            Living situation: family    Current pertinent medications: Zofran    Pertinent labs: (7/25) glu 88 (wnl), K 2.9 (K-DUR)    NUTRITION DIAGNOSIS:     1. Unintentional wt loss related to pyelonephritis/n/vt as evidenced by 6% wt loss .      NUTRITION INTERVENTION:     Recommended diet: Gestational DM 2000 cal diet   Prenatal vitamin daily    Recommended nutrition supplement: Ensure Compact bid    NUTRITION MONITORING AND EVALUATION:     Nutrition level of care: moderate    Nutrition monitoring: PO intake, diet tolerance and compliance, wt, BS, CMP, hydration, medical changes    Nutrition goals: PO intake >75% without n/v, nutrition related labs WNL, stable wt during  LOS       NUTRITION EDUCATION:   Encouraged po intake of meals & supplements     Linda [name], RD  10/10/17

## 2017-10-10 NOTE — Progress Notes (Signed)
PAGER ID: [phone]   MESSAGE: Pt. in room 4213 Linda [name], Pt. of Dr. Earlie LouJasarevic. Pt. asking for trazadone. Patient states she takes 200mg  every night. [name] [phone]

## 2017-10-10 NOTE — ED Notes (Signed)
 TRANSFER - OUT REPORT:    Verbal report given to Courtney(name) on Linda [name]  being transferred to [facility](unit) for routine progression of care       Report consisted of patient's Situation, Background, Assessment and   Recommendations(SBAR).     Information from the following report(s) SBAR, MAR and Recent Results was reviewed with the receiving nurse.    Lines:   Peripheral IV 10/09/17 Right Antecubital (Active)   Site Assessment Clean, dry, & intact 10/09/2017  6:20 PM   Phlebitis Assessment 0 10/09/2017  6:20 PM   Infiltration Assessment 0 10/09/2017  6:20 PM   Dressing Type Transparent;Tape 10/09/2017  6:20 PM   Hub Color/Line Status Positional;Flushed 10/09/2017  6:20 PM   Action Taken Blood drawn 10/09/2017  6:20 PM   Alcohol Cap Used No 10/09/2017  6:20 PM        Opportunity for questions and clarification was provided.      Patient transported with:   [employer]

## 2017-10-10 NOTE — Progress Notes (Signed)
 Patient admitted on 10/09/2017 from home with Nausea and Vomiting   Chief Complaint   Patient presents with   . Nausea   . Vomiting   . Headache        The patient has been admitted to the hospital 1 times in the past 12 months.    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 No    PCP: Other, Phys, MD TCC referral     Specialists:         Face sheet information, address, contact info and insurance verified Yes Correct address is [address] [place] [address].     Dialysis Unit/ chair time / access:        Pharm[facility]   Any issues getting medications No    DME at home and provider:        O2 at home  provider    Home Environment:    Lives at [address]  [place] [address]    [email or web address] @.     Prior to admission open services:         Home Health Agency-         Personal Care Agency-        Extended Emergency Contact Information  Primary Emergency Contact: ELDONNA NICE  Home Phone: [phone]  Relation: Grandparent  Secondary Emergency Contact: Copeland,Felicia (Godmother)   [place]  Home Phone: [phone]  Relation: Other Relative      Transportation:     Sister  will transport home    Therapy Recommendations:  OT :y/n       PT :y/n       SLP :y/n        RT Home O2 Evaluation :y/n         Wound Care: y/n         Change consult (formerly chamberlain) :      Case Management Assessment    ABUSE/NEGLECT SCREENING   Physical Abuse/Neglect: Denies   Sexual Abuse: Denies   Sexual Abuse: Denies   Other Abuse/Issues: Denies          PRIMARY DECISION MAKER    SELF                                CARE MANAGEMENT INTERVENTIONS   Readmission Interview Completed: Not Applicable   PCP Verified by CM: Yes            Mode of Transport at Discharge: BLS       Transition of Care Consult (CM Consult): Discharge Planning           MyChart Signup: No   Discharge Durable Medical Equipment: No   Physical Therapy Consult: No   Occupational Therapy Consult: No   Speech Therapy Consult: No   Current Support Network: Relative's Home   Reason for Referral: DCP Rounds   History Provided By: Patient, Medical Record   Patient Orientation: Alert and Oriented  Cognition: Alert       Previous Living Arrangement: Lives with Family Independent   Home Accessibility: Steps(3 step entry into home )   Prior Functional Level: Independent in ADLs/IADLs   Current Functional Level: Independent in ADLs/IADLs   Primary Language: English   Can patient return to prior living arrangement: Yes   Ability to make needs known:: Good   Family able to assist with home care needs:: Yes               Types of Needs Identified: Disease Management Education, Treatment Education       Confirm Follow Up Transport: Family   Confirm Transport and Arrange: Yes   Plan discussed with Pt/Family/Caregiver: Yes   Freedom of Choice Offered: Yes      DISCHARGE LOCATION   Discharge Placement: Home with family assistance

## 2017-10-10 NOTE — Progress Notes (Signed)
Progress  Notes by Faythe GheeHuang, Oreste Majeed, DO at 10/10/17 1255                Author: Faythe GheeHuang, Kaneesha Constantino, DO  Service: Hospitalist  Author Type: Physician       Filed: 10/10/17 1300  Date of Service: 10/10/17 1255  Status: Signed          Editor: Faythe GheeHuang, Antonela Freiman, DO (Physician)                                                                                                                               Assessment/Plan:        Pyelonephritis    Hypokalemia    Intrauterine Pregnancy    Nausea vomiting and back pain.   ??     Plan        Continue IVF    Reviewed renal ultrasound, no stone.   Continue Ceftriaxone IV   Zofran IV   Pain control with Tylenol.   GYN consulted, appreciate assist in advance.   Follow blood and urine CS   DVT PPX ambulatory    ACP: CODE STATUS FULL CODE    Continue home regimen for otherwise chronic, stable medical conditions as noted above.      Discussed with patient. Updates regarding diagnose, tests results, prognosis, treatment  of the patient's medical conditions discussed in detail. All questions answered to the satisfaction of those individual(s) who also verbalized understanding of and  agreement with the assessment and plan.    Date of anticipated Discharge: Likely in a.m.      EXAM:   GENERAL: in mild distress.    HEENT: Normocephalic and atraumatic head. Oral mucosa moist. No thrush.     RESPIRATORY: No use of accessory muscles of respiration. Bilateral BS present, decreased at bases. No rales or rhonchi.    CARDIOVASCULAR: S1 and S2 present, regular. No murmur, rub, or thrill.    ABDOMEN: Soft and nontender with positive bowel sounds. No organomegaly.    EXTREMITIES: No edema. No calf tenderness.    NEUROLOGICAL: Cranial nerves II through XII grossly intact. No focal weakness.    SKIN: No rash. Skin is warm, dry, and intact.         Subjective:        Patient complaining of back pain and nausea vomiting.           Vitals:             10/10/17 0525  10/10/17 0535  10/10/17 0741  10/10/17 1122            BP:  (!) 148/39  144/59  134/59  93/46     Pulse:      69  74     Resp:      16  16     Temp:      97.9 ??F (36.6 ??C)  97.9 ??F (36.6 ??C)     SpO2:  99%  99%     Weight:                   Height:                     Recent Labs           10/09/17   1823     WBC  8.0     HGB  11.7*     HCT  33.8*     MCV  73.3*        PLT  229          Recent Labs           10/09/17   1823     NA  135*     K  2.9*     CL  101     CO2  26     AGAP  7     CA  9.1     BUN  5*     CREA  0.6        GLU  88          Recent Labs           10/09/17   1823     ALB  3.2*     TP  7.6     SGOT  31     AP  68     TBILI  0.4        ALT  25          Recent Labs           10/09/17   2130     GLUCU  NEGATIVE     BILU  SMALL*     KETU  40*     SPGRU  1.020     BLDU  NEGATIVE     PHU  6.0     PROTU  TRACE*     UROU  4.0*     NITU  NEGATIVE        LEUKU  NEGATIVE         No results for input(s): GLUCPOC in the last 72 hours.        Total of 36 minutes of which more than 50% was spent in coordination of care and counseling (time spent with patient/family face to face, physical exam, reviewing laboratory and imaging investigations, speaking with physicians and nursing staff involved  in this patient's care).       Faythe Ghee D.O.   [facility]

## 2017-10-11 ENCOUNTER — Inpatient Hospital Stay: Admit: 2017-10-11 | Payer: MEDICAID

## 2017-10-11 LAB — CBC WITH AUTOMATED DIFF
BASOPHILS: 0.2 % (ref 0–3)
EOSINOPHILS: 0.4 % (ref 0–5)
HCT: 29.9 % — ABNORMAL LOW (ref 37.0–50.0)
HGB: 10.1 gm/dl — ABNORMAL LOW (ref 13.0–17.2)
IMMATURE GRANULOCYTES: 0.2 % (ref 0.0–3.0)
LYMPHOCYTES: 31.4 % (ref 28–48)
MCH: 25.3 pg — ABNORMAL LOW (ref 25.4–34.6)
MCHC: 33.8 gm/dl (ref 30.0–36.0)
MCV: 74.8 fL — ABNORMAL LOW (ref 80.0–98.0)
MONOCYTES: 6.8 % (ref 1–13)
MPV: 8.9 fL (ref 6.0–10.0)
NEUTROPHILS: 61 % (ref 34–64)
NRBC: 0 (ref 0–0)
PLATELET: 218 10*3/uL (ref 140–450)
RBC: 4 M/uL (ref 3.60–5.20)
RDW-SD: 39.6 (ref 36.4–46.3)
WBC: 4.7 10*3/uL (ref 4.0–11.0)

## 2017-10-11 LAB — RENAL FUNCTION PANEL
Albumin: 2.4 gm/dl — ABNORMAL LOW (ref 3.4–5.0)
Albumin: 2.4 gm/dl — ABNORMAL LOW (ref 3.4–5.0)
Anion Gap: 9 mmol/L (ref 5–15)
Anion gap: 9 mmol/L (ref 5–15)
BUN: 4 mg/dl — ABNORMAL LOW (ref 7–25)
BUN: 4 mg/dl — ABNORMAL LOW (ref 7–25)
CO2: 21 mEq/L (ref 21–32)
CO2: 21 mEq/L (ref 21–32)
Calcium: 8 mg/dl — ABNORMAL LOW (ref 8.5–10.1)
Calcium: 8 mg/dl — ABNORMAL LOW (ref 8.5–10.1)
Chloride: 109 mEq/L — ABNORMAL HIGH (ref 98–107)
Chloride: 109 mEq/L — ABNORMAL HIGH (ref 98–107)
Creatinine: 0.5 mg/dl — ABNORMAL LOW (ref 0.6–1.3)
Creatinine: 0.5 mg/dl — ABNORMAL LOW (ref 0.6–1.3)
EGFR IF NonAfrican American: >60
GFR African American: >60
GFR est AA: >60
GFR est non-AA: >60
Glucose: 118 mg/dl — ABNORMAL HIGH (ref 74–106)
Glucose: 118 mg/dl — ABNORMAL HIGH (ref 74–106)
Phosphorus: 3 mg/dl (ref 2.5–4.9)
Phosphorus: 3 mg/dl (ref 2.5–4.9)
Potassium: 3.2 mEq/L — ABNORMAL LOW (ref 3.5–5.1)
Potassium: 3.2 mEq/L — ABNORMAL LOW (ref 3.5–5.1)
Sodium: 140 mEq/L (ref 136–145)
Sodium: 140 mEq/L (ref 136–145)

## 2017-10-11 LAB — T4, FREE
FREE T4, FT4T: 1.39 ng/dl (ref 0.76–1.46)
Free T4: 1.39 ng/dl (ref 0.76–1.46)

## 2017-10-11 LAB — CULTURE, URINE
CULTURE RESULT: NO GROWTH
Culture result: NO GROWTH

## 2017-10-11 LAB — TSH 3RD GENERATION
TSH: 0.407 u[IU]/mL (ref 0.358–3.740)
TSH: 0.407 u[IU]/mL (ref 0.358–3.740)

## 2017-10-11 LAB — CBC WITH AUTO DIFFERENTIAL
Basophils %: 0.2 % (ref 0–3)
Eosinophils %: 0.4 % (ref 0–5)
Hematocrit: 29.9 % — ABNORMAL LOW (ref 37.0–50.0)
Hemoglobin: 10.1 gm/dl — ABNORMAL LOW (ref 13.0–17.2)
Immature Granulocytes: 0.2 % (ref 0.0–3.0)
Lymphocytes %: 31.4 % (ref 28–48)
MCH: 25.3 pg — ABNORMAL LOW (ref 25.4–34.6)
MCHC: 33.8 gm/dl (ref 30.0–36.0)
MCV: 74.8 fL — ABNORMAL LOW (ref 80.0–98.0)
MPV: 8.9 fL (ref 6.0–10.0)
Monocytes %: 6.8 % (ref 1–13)
Neutrophils %: 61 % (ref 34–64)
Nucleated RBCs: 0 (ref 0–0)
Platelets: 218 10*3/uL (ref 140–450)
RBC: 4 M/uL (ref 3.60–5.20)
RDW-SD: 39.6 (ref 36.4–46.3)
WBC: 4.7 10*3/uL (ref 4.0–11.0)

## 2017-10-11 MED ORDER — PROMETHAZINE IN NS 25 MG/50 ML IV PIGGY BAG
25 mg/50 ml | Freq: Three times a day (TID) | INTRAVENOUS | Status: DC | PRN
Start: 2017-10-11 — End: 2017-10-12
  Administered 2017-10-11 – 2017-10-12 (×5): via INTRAVENOUS

## 2017-10-11 MED ORDER — NS WITH POTASSIUM CHLORIDE 40 MEQ/L IV
40 mEq/L | INTRAVENOUS | Status: DC
Start: 2017-10-11 — End: 2017-10-15
  Administered 2017-10-11 – 2017-10-15 (×9): via INTRAVENOUS

## 2017-10-11 MED ORDER — SODIUM CHLORIDE 0.9 % INJECTION
20 mg/2 mL | Freq: Two times a day (BID) | INTRAMUSCULAR | Status: DC
Start: 2017-10-11 — End: 2017-10-17
  Administered 2017-10-11 – 2017-10-17 (×14): via INTRAVENOUS

## 2017-10-11 MED ORDER — HYDROCODONE-ACETAMINOPHEN 5 MG-325 MG TAB
5-325 mg | Freq: Four times a day (QID) | ORAL | Status: DC | PRN
Start: 2017-10-11 — End: 2017-10-17
  Administered 2017-10-11 – 2017-10-16 (×13): via ORAL

## 2017-10-11 MED FILL — HYDROCODONE-ACETAMINOPHEN 5 MG-325 MG TAB: 5-325 mg | ORAL | Qty: 1

## 2017-10-11 MED FILL — NS WITH POTASSIUM CHLORIDE 40 MEQ/L IV: 40 mEq/L | INTRAVENOUS | Qty: 1000

## 2017-10-11 MED FILL — PROMETHAZINE IN NS 25 MG/50 ML IV PIGGY BAG: 25 mg/50 ml | INTRAVENOUS | Qty: 50

## 2017-10-11 MED FILL — FAMOTIDINE (PF) 20 MG/2 ML IV: 20 mg/2 mL | INTRAVENOUS | Qty: 2

## 2017-10-11 MED FILL — LEVOTHYROXINE 25 MCG TAB: 25 mcg | ORAL | Qty: 1

## 2017-10-11 MED FILL — CEFTRIAXONE 1 GRAM SOLUTION FOR INJECTION: 1 gram | INTRAMUSCULAR | Qty: 1

## 2017-10-11 NOTE — Progress Notes (Signed)
Problem: Falls - Risk of  Goal: *Absence of Falls  Description  Document Schmid Fall Risk and appropriate interventions in the flowsheet.  Outcome: Progressing Towards Goal  Note:   Fall Risk Interventions:            Medication Interventions: Patient to call before getting OOB

## 2017-10-11 NOTE — Progress Notes (Signed)
Medical Progress Note      NAME: Linda [name]   DOB:  01/13/1991  MRM:  [id number]    Date/Time: 10/11/2017  12:26 PM         Subjective:     27 year old woman with IUP admitted with pyelonephritis. Pt CO flank pain and nausea.     Past Medical History reviewed and unchanged from Admission History and Physical    Review of Systems   Constitutional: Positive for fatigue.   HENT: Negative.    Eyes: Negative.    Respiratory: Negative.    Cardiovascular: Negative.    Gastrointestinal: Positive for nausea.   Endocrine: Negative.    Genitourinary: Positive for dysuria and flank pain.   Neurological: Negative.    Hematological: Negative.    Psychiatric/Behavioral: Negative.             Objective:       Vitals:      Last 24hrs VS reviewed since prior progress note. Most recent are:    Visit Vitals  BP 122/72 (BP Patient Position: Sitting)   Pulse 93   Temp 98.5 ??F (36.9 ??C)   Resp 16   Ht 5\' 5"  (1.651 m)   Wt 149.7 kg (330 lb)   SpO2 100%   BMI 54.91 kg/m??     SpO2 Readings from Last 6 Encounters:   10/11/17 100%   09/25/17 100%   09/14/17 99%   08/05/17 99%   12/11/15 100%   08/02/15 99%            Intake/Output Summary (Last 24 hours) at 10/11/2017 1226  Last data filed at 10/11/2017 0645  Gross per 24 hour   Intake 2157.08 ml   Output 300 ml   Net 1857.08 ml          Exam:      Physical Exam   Nursing note and vitals reviewed.  Constitutional: She is oriented to person, place, and time. She appears well-developed and well-nourished.   HENT:   Head: Normocephalic and atraumatic.   Eyes: EOM are normal. Pupils are equal, round, and reactive to light.   Neck: Normal range of motion. Neck supple.   Cardiovascular: Normal rate and regular rhythm.   Pulmonary/Chest: Effort normal and breath sounds normal.   Abdominal: Soft. Bowel sounds are normal.   Musculoskeletal: Normal range of motion. She exhibits edema.   Neurological: She is alert and oriented to person, place, and time.   Skin: Skin is warm and dry.        Lab Data Reviewed: (see below)      Medications:  Current Facility-Administered Medications   Medication Dose Route Frequency   ??? levothyroxine (SYNTHROID) tablet 75 mcg  75 mcg Oral Q24H   ??? sodium chloride (NS) flush 5-10 mL  5-10 mL IntraVENous Q8H   ??? sodium chloride (NS) flush 5-10 mL  5-10 mL IntraVENous PRN   ??? naloxone (NARCAN) injection 0.1 mg  0.1 mg IntraVENous PRN   ??? cefTRIAXone (ROCEPHIN) 1 g in 0.9% sodium chloride (MBP/ADV) 50 mL MBP  1 g IntraVENous Q24H   ??? ondansetron (ZOFRAN) injection 4 mg  4 mg IntraVENous Q6H PRN   ??? promethazine (PHENERGAN) 25 mg in NS IVPB  25 mg IntraVENous Q8H PRN   ??? HYDROcodone-acetaminophen (NORCO) 5-325 mg per tablet 1 Tab  1 Tab Oral Q6H PRN   ??? famotidine (PF) (PEPCID) 20 mg in sodium chloride 0.9% 10 mL injection  20 mg IntraVENous Q12H  ______________________________________________________________________      Lab Review:     Recent Labs     10/11/17  1025 10/09/17  1823   WBC 4.7 8.0   HGB 10.1* 11.7*   HCT 29.9* 33.8*   PLT 218 229     Recent Labs     10/11/17  1025 10/09/17  1823   NA 140 135*   K 3.2* 2.9*   CL 109* 101   CO2 21 26   GLU 118* 88   BUN 4* 5*   CREA 0.5* 0.6   CA 8.0* 9.1   PHOS 3.0  --    ALB 2.4* 3.2*   SGOT  --  31   ALT  --  25          Assessment:     Active Problems:    Back pain (10/09/2017)      Intractable vomiting with nausea (10/09/2017)           Plan:     Risk of deterioration: medium             1. Pyelonephritis. Continue antibiotics and PRN pain meds  2. Dehydration due to N/V continue IV fluids  3. Hypokalemia. Add KCL to IV fluids  4. IUP per OB GYN         Total time spent with patient: 30 Minutes                  Care Plan discussed with: Patient, Care Manager, Nursing Staff and >50% of time spent in counseling and coordination of care    Discussed:  Care Plan    Prophylaxis:  SCD's    Disposition:  Home w/Family           ___________________________________________________    Attending Physician: Marlene BastJeffrey G Treyvonne Tata, MD

## 2017-10-11 NOTE — Progress Notes (Signed)
Obstetrics Progress Note    Name: Linda [name] MRN: [id number]  SSN: [id number]    Date of Birth: September 18, 1990  Age: 27 y.o.  Sex: female      Subjective:      LOS: 2 days    Estimated Date of Delivery: 04/21/2018.   Gestational Age Today: 12 4/7 weeks     Patient admitted for nausea and vomiting and pyleonephritis . States she has no fever ,her nausea and vomiting is better with promethazine ,she tolerated her regular diet this morning still complain of right flank pain ,no vaginal bleeding      Objective:     Vitals:  Blood pressure 122/72, pulse 93, temperature 98.5 ??F (36.9 ??C), resp. rate 16, height 5\' 5"  (1.651 m), weight 149.7 kg (330 lb), last menstrual period 08/14/2017, SpO2 100 %, unknown if currently breastfeeding.   Temp (24hrs), Avg:98.1 ??F (36.7 ??C), Min:97.6 ??F (36.4 ??C), Max:98.6 ??F (37 ??C)    Systolic (24hrs), Avg:128 , Min:107 , Max:141      Diastolic (24hrs), Avg:66, Min:40, Max:75       Intake and Output:         Physical Exam:  Heart: Regular rate and rhythm, S1S2 present or without murmur or extra heart sounds  Lung: clear to auscultation throughout lung fields, no wheezes, no rales, no rhonchi and normal respiratory effort  Back: costovertebral angle tenderness present  Abdomen: soft, nontender  Fundus: soft and non tender  Perineum: blood absent, amniotic fluid absent  Lower Extremities:  - Edema 1+         Fetal Heart Rate: called antepartum for FHT check       Labs:   Recent Results (from the past 36 hour(s))   CULTURE, BLOOD    Collection Time: 10/10/17  5:30 PM   Result Value Ref Range    Blood Culture Result Culture In Progress, Daily Updates To Follow     CULTURE, BLOOD    Collection Time: 10/10/17  5:35 PM   Result Value Ref Range    Blood Culture Result Culture In Progress, Daily Updates To Follow     CBC WITH AUTOMATED DIFF    Collection Time: 10/11/17 10:25 AM   Result Value Ref Range    WBC 4.7 4.0 - 11.0 1000/mm3    RBC 4.00 3.60 - 5.20 M/uL    HGB 10.1 (L) 13.0 - 17.2 gm/dl     HCT 91.429.9 (L) 78.237.0 - 50.0 %    MCV 74.8 (L) 80.0 - 98.0 fL    MCH 25.3 (L) 25.4 - 34.6 pg    MCHC 33.8 30.0 - 36.0 gm/dl    PLATELET 956218 213140 - 086450 1000/mm3    MPV 8.9 6.0 - 10.0 fL    RDW-SD 39.6 36.4 - 46.3      NRBC 0 0 - 0      IMMATURE GRANULOCYTES 0.2 0.0 - 3.0 %    NEUTROPHILS 61.0 34 - 64 %    LYMPHOCYTES 31.4 28 - 48 %    MONOCYTES 6.8 1 - 13 %    EOSINOPHILS 0.4 0 - 5 %    BASOPHILS 0.2 0 - 3 %   RENAL FUNCTION PANEL    Collection Time: 10/11/17 10:25 AM   Result Value Ref Range    Sodium 140 136 - 145 mEq/L    Potassium 3.2 (L) 3.5 - 5.1 mEq/L    Chloride 109 (H) 98 - 107 mEq/L    CO2 21 21 -  32 mEq/L    Glucose 118 (H) 74 - 106 mg/dl    BUN 4 (L) 7 - 25 mg/dl    Creatinine 0.5 (L) 0.6 - 1.3 mg/dl    GFR est AA >16.1      GFR est non-AA >60      Calcium 8.0 (L) 8.5 - 10.1 mg/dl    Albumin 2.4 (L) 3.4 - 5.0 gm/dl    Phosphorus 3.0 2.5 - 4.9 mg/dl    Anion gap 9 5 - 15 mmol/L   TSH 3RD GENERATION    Collection Time: 10/11/17 10:25 AM   Result Value Ref Range    TSH 0.407 0.358 - 3.740 uIU/mL   T4, FREE    Collection Time: 10/11/17 10:25 AM   Result Value Ref Range    Free T4 1.39 0.76 - 1.46 ng/dl       Assessment and Plan:      Active Problems:    Back pain (10/09/2017)      Intractable vomiting with nausea (10/09/2017)       26 yo [id number] ,12 4/7 WEEKS IUP WITH PYELONEPHRITIS  ,anemia   Afebrile ,pain improved with Vicodin ,she want to take it after she eat her lunch   Continue rocephin ,pain management   Discussed trazodone with patient  .will not give it since she is taking promethazine iv .      Signed By: Leonette Nutting, MD     October 11, 2017

## 2017-10-11 NOTE — Other (Signed)
[name]. Pt needs new IV site. She's receiving IV antibiotics and IVF. Nurse Donn PieriniJuana ext [phone]

## 2017-10-11 NOTE — Progress Notes (Signed)
Medical Progress Note      NAME: Linda [name]   DOB:  01-21-1991  MRM:  [id number]    Date/Time: 10/11/2017  12:26 PM         Subjective:     27 year old woman with IUP admitted with pyelonephritis. Pt CO flank pain and nausea.     Past Medical History reviewed and unchanged from Admission History and Physical    Review of Systems   Constitutional: Positive for fatigue.   HENT: Negative.    Eyes: Negative.    Respiratory: Negative.    Cardiovascular: Negative.    Gastrointestinal: Positive for nausea.   Endocrine: Negative.    Genitourinary: Positive for dysuria and flank pain.   Neurological: Negative.    Hematological: Negative.    Psychiatric/Behavioral: Negative.             Objective:       Vitals:      Last 24hrs VS reviewed since prior progress note. Most recent are:    Visit Vitals  BP 122/72 (BP Patient Position: Sitting)   Pulse 93   Temp 98.5 ??F (36.9 ??C)   Resp 16   Ht 5\' 5"  (1.651 m)   Wt 149.7 kg (330 lb)   SpO2 100%   BMI 54.91 kg/m??     SpO2 Readings from Last 6 Encounters:   10/11/17 100%   09/25/17 100%   09/14/17 99%   08/05/17 99%   12/11/15 100%   08/02/15 99%            Intake/Output Summary (Last 24 hours) at 10/11/2017 1226  Last data filed at 10/11/2017 0645  Gross per 24 hour   Intake 2157.08 ml   Output 300 ml   Net 1857.08 ml          Exam:      Physical Exam   Nursing note and vitals reviewed.  Constitutional: She is oriented to person, place, and time. She appears well-developed and well-nourished.   HENT:   Head: Normocephalic and atraumatic.   Eyes: EOM are normal. Pupils are equal, round, and reactive to light.   Neck: Normal range of motion. Neck supple.   Cardiovascular: Normal rate and regular rhythm.   Pulmonary/Chest: Effort normal and breath sounds normal.   Abdominal: Soft. Bowel sounds are normal.   Musculoskeletal: Normal range of motion. She exhibits edema.   Neurological: She is alert and oriented to person, place, and time.   Skin: Skin is warm and dry.       Lab Data  Reviewed: (see below)      Medications:  Current Facility-Administered Medications   Medication Dose Route Frequency   ??? levothyroxine (SYNTHROID) tablet 75 mcg  75 mcg Oral Q24H   ??? sodium chloride (NS) flush 5-10 mL  5-10 mL IntraVENous Q8H   ??? sodium chloride (NS) flush 5-10 mL  5-10 mL IntraVENous PRN   ??? naloxone (NARCAN) injection 0.1 mg  0.1 mg IntraVENous PRN   ??? cefTRIAXone (ROCEPHIN) 1 g in 0.9% sodium chloride (MBP/ADV) 50 mL MBP  1 g IntraVENous Q24H   ??? ondansetron (ZOFRAN) injection 4 mg  4 mg IntraVENous Q6H PRN   ??? promethazine (PHENERGAN) 25 mg in NS IVPB  25 mg IntraVENous Q8H PRN   ??? HYDROcodone-acetaminophen (NORCO) 5-325 mg per tablet 1 Tab  1 Tab Oral Q6H PRN   ??? famotidine (PF) (PEPCID) 20 mg in sodium chloride 0.9% 10 mL injection  20 mg IntraVENous Q12H  ______________________________________________________________________      Lab Review:     Recent Labs     10/11/17  1025 10/09/17  1823   WBC 4.7 8.0   HGB 10.1* 11.7*   HCT 29.9* 33.8*   PLT 218 229     Recent Labs     10/11/17  1025 10/09/17  1823   NA 140 135*   K 3.2* 2.9*   CL 109* 101   CO2 21 26   GLU 118* 88   BUN 4* 5*   CREA 0.5* 0.6   CA 8.0* 9.1   PHOS 3.0  --    ALB 2.4* 3.2*   SGOT  --  31   ALT  --  25          Assessment:     Active Problems:    Back pain (10/09/2017)      Intractable vomiting with nausea (10/09/2017)           Plan:     Risk of deterioration: medium             1. Pyelonephritis. Continue antibiotics and PRN pain meds  2. Dehydration due to N/V continue IV fluids  3. Hypokalemia. Add KCL to IV fluids  4. IUP per OB GYN         Total time spent with patient: 30 Minutes                  Care Plan discussed with: Patient, Care Manager, Nursing Staff and >50% of time spent in counseling and coordination of care    Discussed:  Care Plan    Prophylaxis:  SCD's    Disposition:  Home w/Family           ___________________________________________________    Attending Physician: Marlene BastJeffrey G Treyvonne Tata, MD

## 2017-10-11 NOTE — Progress Notes (Signed)
Obstetrics Progress Note    Name: Linda [name] MRN: [id number]  SSN: [id number]    Date of Birth: 03/15/1991  Age: 26 y.o.  Sex: female      Subjective:      LOS: 2 days    Estimated Date of Delivery: 04/21/2018.   Gestational Age Today: 12 4/7 weeks     Patient admitted for nausea and vomiting and pyleonephritis . States she has no fever ,her nausea and vomiting is better with promethazine ,she tolerated her regular diet this morning still complain of right flank pain ,no vaginal bleeding      Objective:     Vitals:  Blood pressure 122/72, pulse 93, temperature 98.5 ??F (36.9 ??C), resp. rate 16, height 5' 5" (1.651 m), weight 149.7 kg (330 lb), last menstrual period 08/14/2017, SpO2 100 %, unknown if currently breastfeeding.   Temp (24hrs), Avg:98.1 ??F (36.7 ??C), Min:97.6 ??F (36.4 ??C), Max:98.6 ??F (37 ??C)    Systolic (24hrs), Avg:128 , Min:107 , Max:141      Diastolic (24hrs), Avg:66, Min:40, Max:75       Intake and Output:         Physical Exam:  Heart: Regular rate and rhythm, S1S2 present or without murmur or extra heart sounds  Lung: clear to auscultation throughout lung fields, no wheezes, no rales, no rhonchi and normal respiratory effort  Back: costovertebral angle tenderness present  Abdomen: soft, nontender  Fundus: soft and non tender  Perineum: blood absent, amniotic fluid absent  Lower Extremities:  - Edema 1+         Fetal Heart Rate: called antepartum for FHT check       Labs:   Recent Results (from the past 36 hour(s))   CULTURE, BLOOD    Collection Time: 10/10/17  5:30 PM   Result Value Ref Range    Blood Culture Result Culture In Progress, Daily Updates To Follow     CULTURE, BLOOD    Collection Time: 10/10/17  5:35 PM   Result Value Ref Range    Blood Culture Result Culture In Progress, Daily Updates To Follow     CBC WITH AUTOMATED DIFF    Collection Time: 10/11/17 10:25 AM   Result Value Ref Range    WBC 4.7 4.0 - 11.0 1000/mm3    RBC 4.00 3.60 - 5.20 M/uL    HGB 10.1 (L) 13.0 - 17.2 gm/dl     HCT 29.9 (L) 37.0 - 50.0 %    MCV 74.8 (L) 80.0 - 98.0 fL    MCH 25.3 (L) 25.4 - 34.6 pg    MCHC 33.8 30.0 - 36.0 gm/dl    PLATELET 218 140 - 450 1000/mm3    MPV 8.9 6.0 - 10.0 fL    RDW-SD 39.6 36.4 - 46.3      NRBC 0 0 - 0      IMMATURE GRANULOCYTES 0.2 0.0 - 3.0 %    NEUTROPHILS 61.0 34 - 64 %    LYMPHOCYTES 31.4 28 - 48 %    MONOCYTES 6.8 1 - 13 %    EOSINOPHILS 0.4 0 - 5 %    BASOPHILS 0.2 0 - 3 %   RENAL FUNCTION PANEL    Collection Time: 10/11/17 10:25 AM   Result Value Ref Range    Sodium 140 136 - 145 mEq/L    Potassium 3.2 (L) 3.5 - 5.1 mEq/L    Chloride 109 (H) 98 - 107 mEq/L    CO2 21 21 -   32 mEq/L    Glucose 118 (H) 74 - 106 mg/dl    BUN 4 (L) 7 - 25 mg/dl    Creatinine 0.5 (L) 0.6 - 1.3 mg/dl    GFR est AA >14.7      GFR est non-AA >60      Calcium 8.0 (L) 8.5 - 10.1 mg/dl    Albumin 2.4 (L) 3.4 - 5.0 gm/dl    Phosphorus 3.0 2.5 - 4.9 mg/dl    Anion gap 9 5 - 15 mmol/L   TSH 3RD GENERATION    Collection Time: 10/11/17 10:25 AM   Result Value Ref Range    TSH 0.407 0.358 - 3.740 uIU/mL   T4, FREE    Collection Time: 10/11/17 10:25 AM   Result Value Ref Range    Free T4 1.39 0.76 - 1.46 ng/dl       Assessment and Plan:      Active Problems:    Back pain (10/09/2017)      Intractable vomiting with nausea (10/09/2017)       26 yo [id number] ,12 4/7 WEEKS IUP WITH PYELONEPHRITIS  ,anemia   Afebrile ,pain improved with Vicodin ,she want to take it after she eat her lunch   Continue rocephin ,pain management   Discussed trazodone with patient  .will not give it since she is taking promethazine iv .      Signed By: Leonette Nutting, MD     October 11, 2017

## 2017-10-12 LAB — RENAL FUNCTION PANEL
Albumin: 2.5 gm/dl — ABNORMAL LOW (ref 3.4–5.0)
Albumin: 2.5 gm/dl — ABNORMAL LOW (ref 3.4–5.0)
Anion Gap: 7 mmol/L (ref 5–15)
Anion gap: 7 mmol/L (ref 5–15)
BUN: 4 mg/dl — ABNORMAL LOW (ref 7–25)
BUN: 4 mg/dl — ABNORMAL LOW (ref 7–25)
CO2: 23 mEq/L (ref 21–32)
CO2: 23 mEq/L (ref 21–32)
Calcium: 8.8 mg/dl (ref 8.5–10.1)
Calcium: 8.8 mg/dl (ref 8.5–10.1)
Chloride: 108 mEq/L — ABNORMAL HIGH (ref 98–107)
Chloride: 108 mEq/L — ABNORMAL HIGH (ref 98–107)
Creatinine: 0.5 mg/dl — ABNORMAL LOW (ref 0.6–1.3)
Creatinine: 0.5 mg/dl — ABNORMAL LOW (ref 0.6–1.3)
EGFR IF NonAfrican American: >60
GFR African American: >60
GFR est AA: >60
GFR est non-AA: >60
Glucose: 101 mg/dl (ref 74–106)
Glucose: 101 mg/dl (ref 74–106)
Phosphorus: 3.2 mg/dl (ref 2.5–4.9)
Phosphorus: 3.2 mg/dl (ref 2.5–4.9)
Potassium: 4.5 mEq/L (ref 3.5–5.1)
Potassium: 4.5 mEq/L (ref 3.5–5.1)
Sodium: 137 mEq/L (ref 136–145)
Sodium: 137 mEq/L (ref 136–145)

## 2017-10-12 LAB — CBC WITH AUTOMATED DIFF
BASOPHILS: 0.2 % (ref 0–3)
EOSINOPHILS: 0.9 % (ref 0–5)
HCT: 28.6 % — ABNORMAL LOW (ref 37.0–50.0)
HGB: 9.7 gm/dl — ABNORMAL LOW (ref 13.0–17.2)
IMMATURE GRANULOCYTES: 0.2 % (ref 0.0–3.0)
LYMPHOCYTES: 35.8 % (ref 28–48)
MCH: 25.3 pg — ABNORMAL LOW (ref 25.4–34.6)
MCHC: 33.9 gm/dl (ref 30.0–36.0)
MCV: 74.5 fL — ABNORMAL LOW (ref 80.0–98.0)
MONOCYTES: 4.5 % (ref 1–13)
MPV: 9 fL (ref 6.0–10.0)
NEUTROPHILS: 58.4 % (ref 34–64)
NRBC: 0 (ref 0–0)
PLATELET: 227 10*3/uL (ref 140–450)
RBC: 3.84 M/uL (ref 3.60–5.20)
RDW-SD: 39.8 (ref 36.4–46.3)
WBC: 4.6 10*3/uL (ref 4.0–11.0)

## 2017-10-12 LAB — CBC WITH AUTO DIFFERENTIAL
Basophils %: 0.2 % (ref 0–3)
Eosinophils %: 0.9 % (ref 0–5)
Hematocrit: 28.6 % — ABNORMAL LOW (ref 37.0–50.0)
Hemoglobin: 9.7 gm/dl — ABNORMAL LOW (ref 13.0–17.2)
Immature Granulocytes: 0.2 % (ref 0.0–3.0)
Lymphocytes %: 35.8 % (ref 28–48)
MCH: 25.3 pg — ABNORMAL LOW (ref 25.4–34.6)
MCHC: 33.9 gm/dl (ref 30.0–36.0)
MCV: 74.5 fL — ABNORMAL LOW (ref 80.0–98.0)
MPV: 9 fL (ref 6.0–10.0)
Monocytes %: 4.5 % (ref 1–13)
Neutrophils %: 58.4 % (ref 34–64)
Nucleated RBCs: 0 (ref 0–0)
Platelets: 227 10*3/uL (ref 140–450)
RBC: 3.84 M/uL (ref 3.60–5.20)
RDW-SD: 39.8 (ref 36.4–46.3)
WBC: 4.6 10*3/uL (ref 4.0–11.0)

## 2017-10-12 MED ORDER — PROMETHAZINE IN NS 25 MG/50 ML IV PIGGY BAG
25 mg/50 ml | Freq: Three times a day (TID) | INTRAVENOUS | Status: DC
Start: 2017-10-12 — End: 2017-10-15
  Administered 2017-10-12 – 2017-10-15 (×10): via INTRAVENOUS

## 2017-10-12 MED ORDER — FERROUS SULFATE 325 MG (65 MG ELEMENTAL IRON) TAB
325 mg (65 mg iron) | Freq: Two times a day (BID) | ORAL | Status: DC
Start: 2017-10-12 — End: 2017-10-13
  Administered 2017-10-12 – 2017-10-13 (×2): via ORAL

## 2017-10-12 MED FILL — PROMETHAZINE IN NS 25 MG/50 ML IV PIGGY BAG: 25 mg/50 ml | INTRAVENOUS | Qty: 50

## 2017-10-12 MED FILL — NS WITH POTASSIUM CHLORIDE 40 MEQ/L IV: 40 mEq/L | INTRAVENOUS | Qty: 1000

## 2017-10-12 MED FILL — CEFTRIAXONE 1 GRAM SOLUTION FOR INJECTION: 1 gram | INTRAMUSCULAR | Qty: 1

## 2017-10-12 MED FILL — BD POSIFLUSH NORMAL SALINE 0.9 % INJECTION SYRINGE: INTRAMUSCULAR | Qty: 10

## 2017-10-12 MED FILL — FERROUS SULFATE 325 MG (65 MG ELEMENTAL IRON) TAB: 325 mg (65 mg iron) | ORAL | Qty: 1

## 2017-10-12 MED FILL — HYDROCODONE-ACETAMINOPHEN 5 MG-325 MG TAB: 5-325 mg | ORAL | Qty: 1

## 2017-10-12 MED FILL — FAMOTIDINE (PF) 20 MG/2 ML IV: 20 mg/2 mL | INTRAVENOUS | Qty: 2

## 2017-10-12 MED FILL — ONDANSETRON (PF) 4 MG/2 ML INJECTION: 4 mg/2 mL | INTRAMUSCULAR | Qty: 2

## 2017-10-12 MED FILL — LEVOTHYROXINE 25 MCG TAB: 25 mcg | ORAL | Qty: 1

## 2017-10-12 NOTE — Progress Notes (Signed)
Medical Progress Note      NAME: Linda [name]   DOB:  1990-09-30  MRM:  [id number]    Date/Time: 10/12/2017  12:26 PM         Subjective:     27 year old woman with IUP admitted with pyelonephritis. Pt CO flank pain and nausea.     Past Medical History reviewed and unchanged from Admission History and Physical    Review of Systems   Constitutional: Positive for fatigue.   HENT: Negative.    Eyes: Negative.    Respiratory: Negative.    Cardiovascular: Negative.    Gastrointestinal: Positive for nausea.   Endocrine: Negative.    Genitourinary: Positive for dysuria and flank pain.   Neurological: Negative.    Hematological: Negative.    Psychiatric/Behavioral: Negative.             Objective:       Vitals:      Last 24hrs VS reviewed since prior progress note. Most recent are:    Visit Vitals  BP 130/60   Pulse 84   Temp 98.2 ??F (36.8 ??C)   Resp 22   Ht 5\' 5"  (1.651 m)   Wt 149.7 kg (330 lb)   SpO2 95%   BMI 54.91 kg/m??     SpO2 Readings from Last 6 Encounters:   10/12/17 95%   09/25/17 100%   09/14/17 99%   08/05/17 99%   12/11/15 100%   08/02/15 99%            Intake/Output Summary (Last 24 hours) at 10/12/2017 1110  Last data filed at 10/12/2017 0928  Gross per 24 hour   Intake 1200 ml   Output ???   Net 1200 ml          Exam:      Physical Exam   Nursing note and vitals reviewed.  Constitutional: She is oriented to person, place, and time. She appears well-developed and well-nourished.   HENT:   Head: Normocephalic and atraumatic.   Eyes: EOM are normal. Pupils are equal, round, and reactive to light.   Neck: Normal range of motion. Neck supple.   Cardiovascular: Normal rate and regular rhythm.   Pulmonary/Chest: Effort normal and breath sounds normal.   Abdominal: Soft. Bowel sounds are normal.   Musculoskeletal: Normal range of motion. She exhibits edema.   Neurological: She is alert and oriented to person, place, and time.   Skin: Skin is warm and dry.       Lab Data Reviewed: (see below)      Medications:   Current Facility-Administered Medications   Medication Dose Route Frequency   ??? 0.9% sodium chloride with KCl 40 mEq/L infusion   IntraVENous CONTINUOUS   ??? levothyroxine (SYNTHROID) tablet 75 mcg  75 mcg Oral Q24H   ??? sodium chloride (NS) flush 5-10 mL  5-10 mL IntraVENous Q8H   ??? sodium chloride (NS) flush 5-10 mL  5-10 mL IntraVENous PRN   ??? naloxone (NARCAN) injection 0.1 mg  0.1 mg IntraVENous PRN   ??? cefTRIAXone (ROCEPHIN) 1 g in 0.9% sodium chloride (MBP/ADV) 50 mL MBP  1 g IntraVENous Q24H   ??? ondansetron (ZOFRAN) injection 4 mg  4 mg IntraVENous Q6H PRN   ??? promethazine (PHENERGAN) 25 mg in NS IVPB  25 mg IntraVENous Q8H PRN   ??? HYDROcodone-acetaminophen (NORCO) 5-325 mg per tablet 1 Tab  1 Tab Oral Q6H PRN   ??? famotidine (PF) (PEPCID) 20 mg in sodium  chloride 0.9% 10 mL injection  20 mg IntraVENous Q12H       ______________________________________________________________________      Lab Review:     Recent Labs     10/11/17  1025 10/09/17  1823   WBC 4.7 8.0   HGB 10.1* 11.7*   HCT 29.9* 33.8*   PLT 218 229     Recent Labs     10/11/17  1025 10/09/17  1823   NA 140 135*   K 3.2* 2.9*   CL 109* 101   CO2 21 26   GLU 118* 88   BUN 4* 5*   CREA 0.5* 0.6   CA 8.0* 9.1   PHOS 3.0  --    ALB 2.4* 3.2*   SGOT  --  31   ALT  --  25          Assessment:     Active Problems:    Back pain (10/09/2017)      Intractable vomiting with nausea (10/09/2017)           Plan:     Risk of deterioration: medium             1. Pyelonephritis. Continue antibiotics and PRN pain meds  2. Dehydration due to N/V continue IV fluids--antiemetics per OB/GYN  3. Hypokalemia. Added KCL to IV fluids  4. IUP per OB GYN         Total time spent with patient: 30 Minutes                  Care Plan discussed with: Patient, Care Manager, Nursing Staff and >50% of time spent in counseling and coordination of care    Discussed:  Care Plan    Prophylaxis:  SCD's    Disposition:  Home w/Family            ___________________________________________________    Attending Physician: Marlene BastJeffrey G Anwen Cannedy, MD

## 2017-10-12 NOTE — Other (Signed)
Received verbal report from Juana, Rn.

## 2017-10-12 NOTE — Progress Notes (Signed)
Problem: Falls - Risk of  Goal: *Absence of Falls  Description  Document Schmid Fall Risk and appropriate interventions in the flowsheet.  Outcome: Progressing Towards Goal  Note:   Fall Risk Interventions:            Medication Interventions: Teach patient to arise slowly, Patient to call before getting OOB                   Problem: Patient Education: Go to Patient Education Activity  Goal: Patient/Family Education  Outcome: Progressing Towards Goal     Problem: Pain  Goal: *Control of Pain  Outcome: Progressing Towards Goal     Problem: Patient Education: Go to Patient Education Activity  Goal: Patient/Family Education  Outcome: Progressing Towards Goal

## 2017-10-12 NOTE — Progress Notes (Signed)
Obstetrics Progress Note    Name: Linda [name] MRN: [id number]  SSN: [id number]    Date of Birth: February 10, 1991  Age: 27 y.o.  Sex: female      Subjective:      LOS: 3 days    Estimated Date of Delivery: 04/21/2018.   Gestational Age Today: 12 5/7 weeks iup     Patient admitted for nausea and vomiting and pyelonephritis . She vomited her breakfast today ,she declined brat diet yesterday she was placed in regular diet ,her pain is better increased when she vomited today .has no fever or chills .    Objective:     Vitals:  Blood pressure 123/50, pulse 81, temperature 98.1 ??F (36.7 ??C), resp. rate 20, height 5\' 5"  (1.651 m), weight 149.7 kg (330 lb), last menstrual period 08/14/2017, SpO2 100 %, unknown if currently breastfeeding.   Temp (24hrs), Avg:98.1 ??F (36.7 ??C), Min:97.9 ??F (36.6 ??C), Max:98.3 ??F (36.8 ??C)    Systolic (24hrs), Avg:139 , Min:123 , Max:150      Diastolic (24hrs), Avg:60, Min:50, Max:67       Intake and Output:     Date 10/12/17 0700 - 10/13/17 0659   Shift 0700-1459 1500-2259 2300-0659 24 Hour Total   INTAKE   P.O. 600   600   Shift Total(mL/kg) 600(4)   600(4)   OUTPUT   Shift Total(mL/kg)       Weight (kg) 149.7 149.7 149.7 149.7       Physical Exam:  Heart: Regular rate and rhythm, S1S2 present or without murmur or extra heart sounds  Lung: clear to auscultation throughout lung fields, no wheezes, no rales, no rhonchi and normal respiratory effort  Back: costovertebral angle tenderness present  Abdomen: soft, nontender  Fundus: soft and non tender  Perineum: blood absent, amniotic fluid absent       Membranes:  Intact        Fetal Heart Rate:  Fetal cardiac activity seen by us yesterday ,patient with morbid obesity ,unable to hear fht by doppler due to morbid obesity         Labs:   Recent Results (from the past 36 hour(s))   CBC WITH AUTOMATED DIFF    Collection Time: 10/11/17 10:25 AM   Result Value Ref Range    WBC 4.7 4.0 - 11.0 1000/mm3    RBC 4.00 3.60 - 5.20 M/uL     HGB 10.1 (L) 13.0 - 17.2 gm/dl    HCT 65.729.9 (L) 84.637.0 - 50.0 %    MCV 74.8 (L) 80.0 - 98.0 fL    MCH 25.3 (L) 25.4 - 34.6 pg    MCHC 33.8 30.0 - 36.0 gm/dl    PLATELET 962218 952140 - 841450 1000/mm3    MPV 8.9 6.0 - 10.0 fL    RDW-SD 39.6 36.4 - 46.3      NRBC 0 0 - 0      IMMATURE GRANULOCYTES 0.2 0.0 - 3.0 %    NEUTROPHILS 61.0 34 - 64 %    LYMPHOCYTES 31.4 28 - 48 %    MONOCYTES 6.8 1 - 13 %    EOSINOPHILS 0.4 0 - 5 %    BASOPHILS 0.2 0 - 3 %   RENAL FUNCTION PANEL    Collection Time: 10/11/17 10:25 AM   Result Value Ref Range    Sodium 140 136 - 145 mEq/L    Potassium 3.2 (L) 3.5 - 5.1 mEq/L    Chloride 109 (H) 98 - 107  mEq/L    CO2 21 21 - 32 mEq/L    Glucose 118 (H) 74 - 106 mg/dl    BUN 4 (L) 7 - 25 mg/dl    Creatinine 0.5 (L) 0.6 - 1.3 mg/dl    GFR est AA >16.1      GFR est non-AA >60      Calcium 8.0 (L) 8.5 - 10.1 mg/dl    Albumin 2.4 (L) 3.4 - 5.0 gm/dl    Phosphorus 3.0 2.5 - 4.9 mg/dl    Anion gap 9 5 - 15 mmol/L   TSH 3RD GENERATION    Collection Time: 10/11/17 10:25 AM   Result Value Ref Range    TSH 0.407 0.358 - 3.740 uIU/mL   T4, FREE    Collection Time: 10/11/17 10:25 AM   Result Value Ref Range    Free T4 1.39 0.76 - 1.46 ng/dl   RENAL FUNCTION PANEL    Collection Time: 10/12/17 12:08 PM   Result Value Ref Range    Sodium 137 136 - 145 mEq/L    Potassium 4.5 3.5 - 5.1 mEq/L    Chloride 108 (H) 98 - 107 mEq/L    CO2 23 21 - 32 mEq/L    Glucose 101 74 - 106 mg/dl    BUN 4 (L) 7 - 25 mg/dl    Creatinine 0.5 (L) 0.6 - 1.3 mg/dl    GFR est AA >09.6      GFR est non-AA >60      Calcium 8.8 8.5 - 10.1 mg/dl    Albumin 2.5 (L) 3.4 - 5.0 gm/dl    Phosphorus 3.2 2.5 - 4.9 mg/dl    Anion gap 7 5 - 15 mmol/L   CBC WITH AUTOMATED DIFF    Collection Time: 10/12/17 12:08 PM   Result Value Ref Range    WBC 4.6 4.0 - 11.0 1000/mm3    RBC 3.84 3.60 - 5.20 M/uL    HGB 9.7 (L) 13.0 - 17.2 gm/dl    HCT 04.5 (L) 40.9 - 50.0 %    MCV 74.5 (L) 80.0 - 98.0 fL    MCH 25.3 (L) 25.4 - 34.6 pg    MCHC 33.9 30.0 - 36.0 gm/dl     PLATELET 811 914 - 450 1000/mm3    MPV 9.0 6.0 - 10.0 fL    RDW-SD 39.8 36.4 - 46.3      NRBC 0 0 - 0      IMMATURE GRANULOCYTES 0.2 0.0 - 3.0 %    NEUTROPHILS 58.4 34 - 64 %    LYMPHOCYTES 35.8 28 - 48 %    MONOCYTES 4.5 1 - 13 %    EOSINOPHILS 0.9 0 - 5 %    BASOPHILS 0.2 0 - 3 %       Assessment and Plan:      Active Problems:    Back pain (10/09/2017)      Intractable vomiting with nausea (10/09/2017)       26 YO [id number]  ,12 5/7 WEEKS IUP with pyelonephritis  Pain improving   Continue antibiotic   Continue promethazine ,pepcid       Signed By: Leonette Nutting, MD     October 12, 2017

## 2017-10-12 NOTE — Progress Notes (Signed)
Bedside and Verbal shift change report given to selina/liz rn (oncoming nurse) by giovonni (offgoing nurse). Report included the following information SBAR, Kardex and MAR.

## 2017-10-12 NOTE — Progress Notes (Signed)
Problem: Falls - Risk of  Goal: *Absence of Falls  Description  Document Schmid Fall Risk and appropriate interventions in the flowsheet.  Outcome: Progressing Towards Goal  Note:   Fall Risk Interventions:            Medication Interventions: Bed/chair exit alarm, Patient to call before getting OOB

## 2017-10-12 NOTE — Progress Notes (Signed)
 Problem: Falls - Risk of  Goal: *Absence of Falls  Description: Document Linda [name] Fall Risk and appropriate interventions in the flowsheet.  Outcome: Progressing Towards Goal  Note: Fall Risk Interventions:            Medication Interventions: Bed/chair exit alarm, Patient to call before getting OOB

## 2017-10-12 NOTE — Progress Notes (Signed)
Bedside and Verbal shift change report given to selina/liz rn (oncoming nurse) by giovonni (offgoing nurse). Report included the following information SBAR, Kardex and MAR.

## 2017-10-12 NOTE — Progress Notes (Signed)
Obstetrics Progress Note    Name: Linda [name] MRN: [id number]  SSN: [id number]    Date of Birth: Jul 28, 1990  Age: 27 y.o.  Sex: female      Subjective:      LOS: 3 days    Estimated Date of Delivery: 04/21/2018.   Gestational Age Today: 12 5/7 weeks iup     Patient admitted for nausea and vomiting and pyelonephritis . She vomited her breakfast today ,she declined brat diet yesterday she was placed in regular diet ,her pain is better increased when she vomited today .has no fever or chills .    Objective:     Vitals:  Blood pressure 123/50, pulse 81, temperature 98.1 ??F (36.7 ??C), resp. rate 20, height 5\' 5"  (1.651 m), weight 149.7 kg (330 lb), last menstrual period 08/14/2017, SpO2 100 %, unknown if currently breastfeeding.   Temp (24hrs), Avg:98.1 ??F (36.7 ??C), Min:97.9 ??F (36.6 ??C), Max:98.3 ??F (36.8 ??C)    Systolic (24hrs), Avg:139 , Min:123 , Max:150      Diastolic (24hrs), Avg:60, Min:50, Max:67       Intake and Output:     Date 10/12/17 0700 - 10/13/17 0659   Shift 0700-1459 1500-2259 2300-0659 24 Hour Total   INTAKE   P.O. 600   600   Shift Total(mL/kg) 600(4)   600(4)   OUTPUT   Shift Total(mL/kg)       Weight (kg) 149.7 149.7 149.7 149.7       Physical Exam:  Heart: Regular rate and rhythm, S1S2 present or without murmur or extra heart sounds  Lung: clear to auscultation throughout lung fields, no wheezes, no rales, no rhonchi and normal respiratory effort  Back: costovertebral angle tenderness present  Abdomen: soft, nontender  Fundus: soft and non tender  Perineum: blood absent, amniotic fluid absent       Membranes:  Intact        Fetal Heart Rate:  Fetal cardiac activity seen by [place] yesterday ,patient with morbid obesity ,unable to hear fht by doppler due to morbid obesity         Labs:   Recent Results (from the past 36 hour(s))   CBC WITH AUTOMATED DIFF    Collection Time: 10/11/17 10:25 AM   Result Value Ref Range    WBC 4.7 4.0 - 11.0 1000/mm3    RBC 4.00 3.60 - 5.20 M/uL    HGB 10.1 (L) 13.0 -  17.2 gm/dl    HCT 91.4 (L) 78.2 - 50.0 %    MCV 74.8 (L) 80.0 - 98.0 fL    MCH 25.3 (L) 25.4 - 34.6 pg    MCHC 33.8 30.0 - 36.0 gm/dl    PLATELET 956 213 - 086 1000/mm3    MPV 8.9 6.0 - 10.0 fL    RDW-SD 39.6 36.4 - 46.3      NRBC 0 0 - 0      IMMATURE GRANULOCYTES 0.2 0.0 - 3.0 %    NEUTROPHILS 61.0 34 - 64 %    LYMPHOCYTES 31.4 28 - 48 %    MONOCYTES 6.8 1 - 13 %    EOSINOPHILS 0.4 0 - 5 %    BASOPHILS 0.2 0 - 3 %   RENAL FUNCTION PANEL    Collection Time: 10/11/17 10:25 AM   Result Value Ref Range    Sodium 140 136 - 145 mEq/L    Potassium 3.2 (L) 3.5 - 5.1 mEq/L    Chloride 109 (H) 98 - 107  mEq/L    CO2 21 21 - 32 mEq/L    Glucose 118 (H) 74 - 106 mg/dl    BUN 4 (L) 7 - 25 mg/dl    Creatinine 0.5 (L) 0.6 - 1.3 mg/dl    GFR est AA >16.1>60.0      GFR est non-AA >60      Calcium 8.0 (L) 8.5 - 10.1 mg/dl    Albumin 2.4 (L) 3.4 - 5.0 gm/dl    Phosphorus 3.0 2.5 - 4.9 mg/dl    Anion gap 9 5 - 15 mmol/L   TSH 3RD GENERATION    Collection Time: 10/11/17 10:25 AM   Result Value Ref Range    TSH 0.407 0.358 - 3.740 uIU/mL   T4, FREE    Collection Time: 10/11/17 10:25 AM   Result Value Ref Range    Free T4 1.39 0.76 - 1.46 ng/dl   RENAL FUNCTION PANEL    Collection Time: 10/12/17 12:08 PM   Result Value Ref Range    Sodium 137 136 - 145 mEq/L    Potassium 4.5 3.5 - 5.1 mEq/L    Chloride 108 (H) 98 - 107 mEq/L    CO2 23 21 - 32 mEq/L    Glucose 101 74 - 106 mg/dl    BUN 4 (L) 7 - 25 mg/dl    Creatinine 0.5 (L) 0.6 - 1.3 mg/dl    GFR est AA >09.6>60.0      GFR est non-AA >60      Calcium 8.8 8.5 - 10.1 mg/dl    Albumin 2.5 (L) 3.4 - 5.0 gm/dl    Phosphorus 3.2 2.5 - 4.9 mg/dl    Anion gap 7 5 - 15 mmol/L   CBC WITH AUTOMATED DIFF    Collection Time: 10/12/17 12:08 PM   Result Value Ref Range    WBC 4.6 4.0 - 11.0 1000/mm3    RBC 3.84 3.60 - 5.20 M/uL    HGB 9.7 (L) 13.0 - 17.2 gm/dl    HCT 04.528.6 (L) 40.937.0 - 50.0 %    MCV 74.5 (L) 80.0 - 98.0 fL    MCH 25.3 (L) 25.4 - 34.6 pg    MCHC 33.9 30.0 - 36.0 gm/dl    PLATELET 811227 914140 - 782450  1000/mm3    MPV 9.0 6.0 - 10.0 fL    RDW-SD 39.8 36.4 - 46.3      NRBC 0 0 - 0      IMMATURE GRANULOCYTES 0.2 0.0 - 3.0 %    NEUTROPHILS 58.4 34 - 64 %    LYMPHOCYTES 35.8 28 - 48 %    MONOCYTES 4.5 1 - 13 %    EOSINOPHILS 0.9 0 - 5 %    BASOPHILS 0.2 0 - 3 %       Assessment and Plan:      Active Problems:    Back pain (10/09/2017)      Intractable vomiting with nausea (10/09/2017)       26 YO [id number]  ,12 5/7 WEEKS IUP with pyelonephritis  Pain improving   Continue antibiotic   Continue promethazine ,pepcid       Signed By: Leonette NuttingSeham N Robertlee Rogacki, MD     October 12, 2017

## 2017-10-12 NOTE — Progress Notes (Signed)
Medical Progress Note      NAME: Linda [name]   DOB:  1990-07-02  MRM:  [id number]    Date/Time: 10/12/2017  12:26 PM         Subjective:     27 year old woman with IUP admitted with pyelonephritis. Pt CO flank pain and nausea.     Past Medical History reviewed and unchanged from Admission History and Physical    Review of Systems   Constitutional: Positive for fatigue.   HENT: Negative.    Eyes: Negative.    Respiratory: Negative.    Cardiovascular: Negative.    Gastrointestinal: Positive for nausea.   Endocrine: Negative.    Genitourinary: Positive for dysuria and flank pain.   Neurological: Negative.    Hematological: Negative.    Psychiatric/Behavioral: Negative.             Objective:       Vitals:      Last 24hrs VS reviewed since prior progress note. Most recent are:    Visit Vitals  BP 130/60   Pulse 84   Temp 98.2 ??F (36.8 ??C)   Resp 22   Ht 5\' 5"  (1.651 m)   Wt 149.7 kg (330 lb)   SpO2 95%   BMI 54.91 kg/m??     SpO2 Readings from Last 6 Encounters:   10/12/17 95%   09/25/17 100%   09/14/17 99%   08/05/17 99%   12/11/15 100%   08/02/15 99%            Intake/Output Summary (Last 24 hours) at 10/12/2017 1110  Last data filed at 10/12/2017 0928  Gross per 24 hour   Intake 1200 ml   Output ???   Net 1200 ml          Exam:      Physical Exam   Nursing note and vitals reviewed.  Constitutional: She is oriented to person, place, and time. She appears well-developed and well-nourished.   HENT:   Head: Normocephalic and atraumatic.   Eyes: EOM are normal. Pupils are equal, round, and reactive to light.   Neck: Normal range of motion. Neck supple.   Cardiovascular: Normal rate and regular rhythm.   Pulmonary/Chest: Effort normal and breath sounds normal.   Abdominal: Soft. Bowel sounds are normal.   Musculoskeletal: Normal range of motion. She exhibits edema.   Neurological: She is alert and oriented to person, place, and time.   Skin: Skin is warm and dry.       Lab Data Reviewed: (see below)      Medications:  Current  Facility-Administered Medications   Medication Dose Route Frequency   ??? 0.9% sodium chloride with KCl 40 mEq/L infusion   IntraVENous CONTINUOUS   ??? levothyroxine (SYNTHROID) tablet 75 mcg  75 mcg Oral Q24H   ??? sodium chloride (NS) flush 5-10 mL  5-10 mL IntraVENous Q8H   ??? sodium chloride (NS) flush 5-10 mL  5-10 mL IntraVENous PRN   ??? naloxone (NARCAN) injection 0.1 mg  0.1 mg IntraVENous PRN   ??? cefTRIAXone (ROCEPHIN) 1 g in 0.9% sodium chloride (MBP/ADV) 50 mL MBP  1 g IntraVENous Q24H   ??? ondansetron (ZOFRAN) injection 4 mg  4 mg IntraVENous Q6H PRN   ??? promethazine (PHENERGAN) 25 mg in NS IVPB  25 mg IntraVENous Q8H PRN   ??? HYDROcodone-acetaminophen (NORCO) 5-325 mg per tablet 1 Tab  1 Tab Oral Q6H PRN   ??? famotidine (PF) (PEPCID) 20 mg in sodium  chloride 0.9% 10 mL injection  20 mg IntraVENous Q12H       ______________________________________________________________________      Lab Review:     Recent Labs     10/11/17  1025 10/09/17  1823   WBC 4.7 8.0   HGB 10.1* 11.7*   HCT 29.9* 33.8*   PLT 218 229     Recent Labs     10/11/17  1025 10/09/17  1823   NA 140 135*   K 3.2* 2.9*   CL 109* 101   CO2 21 26   GLU 118* 88   BUN 4* 5*   CREA 0.5* 0.6   CA 8.0* 9.1   PHOS 3.0  --    ALB 2.4* 3.2*   SGOT  --  31   ALT  --  25          Assessment:     Active Problems:    Back pain (10/09/2017)      Intractable vomiting with nausea (10/09/2017)           Plan:     Risk of deterioration: medium             1. Pyelonephritis. Continue antibiotics and PRN pain meds  2. Dehydration due to N/V continue IV fluids--antiemetics per OB/GYN  3. Hypokalemia. Added KCL to IV fluids  4. IUP per OB GYN         Total time spent with patient: 30 Minutes                  Care Plan discussed with: Patient, Care Manager, Nursing Staff and >50% of time spent in counseling and coordination of care    Discussed:  Care Plan    Prophylaxis:  SCD's    Disposition:  Home  w/Family           ___________________________________________________    Attending Physician: Marlene BastJeffrey G Lazaria Schaben, MD

## 2017-10-13 LAB — RENAL FUNCTION PANEL
Albumin: 2.7 gm/dl — ABNORMAL LOW (ref 3.4–5.0)
Albumin: 2.7 gm/dl — ABNORMAL LOW (ref 3.4–5.0)
Anion Gap: 8 mmol/L (ref 5–15)
Anion gap: 8 mmol/L (ref 5–15)
BUN: 4 mg/dl — ABNORMAL LOW (ref 7–25)
BUN: 4 mg/dl — ABNORMAL LOW (ref 7–25)
CO2: 23 mEq/L (ref 21–32)
CO2: 23 mEq/L (ref 21–32)
Calcium: 8.9 mg/dl (ref 8.5–10.1)
Calcium: 8.9 mg/dl (ref 8.5–10.1)
Chloride: 103 mEq/L (ref 98–107)
Chloride: 103 mEq/L (ref 98–107)
Creatinine: 0.4 mg/dl — ABNORMAL LOW (ref 0.6–1.3)
Creatinine: 0.4 mg/dl — ABNORMAL LOW (ref 0.6–1.3)
EGFR IF NonAfrican American: >60
GFR African American: >60
GFR est AA: >60
GFR est non-AA: >60
Glucose: 80 mg/dl (ref 74–106)
Glucose: 80 mg/dl (ref 74–106)
Phosphorus: 4.5 mg/dl (ref 2.5–4.9)
Phosphorus: 4.5 mg/dl (ref 2.5–4.9)
Potassium: 4.3 mEq/L (ref 3.5–5.1)
Potassium: 4.3 mEq/L (ref 3.5–5.1)
Sodium: 135 mEq/L — ABNORMAL LOW (ref 136–145)
Sodium: 135 mEq/L — ABNORMAL LOW (ref 136–145)

## 2017-10-13 MED FILL — NS WITH POTASSIUM CHLORIDE 40 MEQ/L IV: 40 mEq/L | INTRAVENOUS | Qty: 1000

## 2017-10-13 MED FILL — LEVOTHYROXINE 25 MCG TAB: 25 mcg | ORAL | Qty: 1

## 2017-10-13 MED FILL — FAMOTIDINE (PF) 20 MG/2 ML IV: 20 mg/2 mL | INTRAVENOUS | Qty: 2

## 2017-10-13 MED FILL — BD POSIFLUSH NORMAL SALINE 0.9 % INJECTION SYRINGE: INTRAMUSCULAR | Qty: 10

## 2017-10-13 MED FILL — CEFTRIAXONE 1 GRAM SOLUTION FOR INJECTION: 1 gram | INTRAMUSCULAR | Qty: 1

## 2017-10-13 MED FILL — HYDROCODONE-ACETAMINOPHEN 5 MG-325 MG TAB: 5-325 mg | ORAL | Qty: 1

## 2017-10-13 MED FILL — PROMETHAZINE IN NS 25 MG/50 ML IV PIGGY BAG: 25 mg/50 ml | INTRAVENOUS | Qty: 50

## 2017-10-13 MED FILL — FERROUS SULFATE 325 MG (65 MG ELEMENTAL IRON) TAB: 325 mg (65 mg iron) | ORAL | Qty: 1

## 2017-10-13 NOTE — Progress Notes (Addendum)
Patient refusing lab draw this am.  0730  Clarification was given patient will only allow PICC team to draw labs. PICC team was notified.

## 2017-10-13 NOTE — Progress Notes (Signed)
Inpatient GYN Note    Patient: Linda [name]    MRN: [id number]    Date of Birth: September 14, 1990    Age: 27 y.o.    Admit Date: 10/09/2017    Attending Physician:Holladay, Versie Starks, MD    Subjective: Linda [name] how Linda [name] became involved with this unassigned patient. She is NOT a patient in our practice. She is 75 6/7, admitted for "pyelo" with R flank pain. She has not had fevers but is having nausea/vomiting. This am, she has eaten her entire breakfast, but feels like she wants to vomit. She denies HG in previous pregnancies. Her pain on the R flank is improved, but now has pain on the L flank. She is getting rocephin daily. Afebrile for >24h and no elevated WBC count.     This am, had some pelvic cramping and spotting with wiping only.     Current Problems/Procedures:   Hospital Problems as of 10/13/2017 Date Reviewed: 2014/11/25          Codes Class Noted - Resolved POA    Back pain ICD-10-CM: M54.9  ICD-9-CM: 724.5  10/09/2017 - Present Unknown        Intractable vomiting with nausea ICD-10-CM: R11.2  ICD-9-CM: 536.2  10/09/2017 - Present Unknown              Active Medications:   Current Facility-Administered Medications   Medication Dose Route Frequency   ??? promethazine (PHENERGAN) 25 mg in NS IVPB  25 mg IntraVENous Q8H   ??? 0.9% sodium chloride with KCl 40 mEq/L infusion   IntraVENous CONTINUOUS   ??? levothyroxine (SYNTHROID) tablet 75 mcg  75 mcg Oral Q24H   ??? sodium chloride (NS) flush 5-10 mL  5-10 mL IntraVENous Q8H   ??? sodium chloride (NS) flush 5-10 mL  5-10 mL IntraVENous PRN   ??? naloxone (NARCAN) injection 0.1 mg  0.1 mg IntraVENous PRN   ??? cefTRIAXone (ROCEPHIN) 1 g in 0.9% sodium chloride (MBP/ADV) 50 mL MBP  1 g IntraVENous Q24H   ??? ondansetron (ZOFRAN) injection 4 mg  4 mg IntraVENous Q6H PRN   ??? HYDROcodone-acetaminophen (NORCO) 5-325 mg per tablet 1 Tab  1 Tab Oral Q6H PRN   ??? famotidine (PF) (PEPCID) 20 mg in sodium chloride 0.9% 10 mL injection  20 mg IntraVENous Q12H       Vital Signs:   Vitals:    10/12/17 2014 10/13/17 0015 10/13/17 0427 10/13/17 0806   BP: 129/57 127/45 120/65 146/62   Pulse: 85 77 72 80   Resp: 23 16 17 16    Temp: 98.1 ??F (36.7 ??C) 98.2 ??F (36.8 ??C) 98.2 ??F (36.8 ??C) 98 ??F (36.7 ??C)   SpO2: 100% 100% 100% 100%   Weight:       Height:           Physical Exam: GENERAL: alert, cooperative, no distress, appears stated age  LUNG: clear to auscultation bilaterally  HEART: regular rate and rhythm, S1, S2 normal, no murmur, click, rub or gallop  ABDOMEN: soft, non-tender. Bowel sounds normal. No masses,  no organomegaly    Laboratory: No results found.    Radiology:        [place] Results (maximum last 3):  Results from Hospital Encounter encounter on 10/09/17   [place] PELV NON OBS  LTD    Narrative Pregnancy ultrasound. Comparison 09/25/2017.    INDICATION: Fetal heart rate.    FINDINGS: Fetal heart rate 158 bpm. Previous fetal heart rate 142 bpm.  Crown-rump  length 12 weeks 3 days. Both ovaries identified and appear to be  normal.      Impression IMPRESSION: Fetal heart rate 158 bpm. See above discussion.     US PREG UTS < 14 WKS SNGL    Narrative Pregnancy ultrasound. Comparison 09/25/2017.    INDICATION: Fetal heart rate.    FINDINGS: Fetal heart rate 158 bpm. Previous fetal heart rate 142 bpm.  Crown-rump length 12 weeks 3 days. Both ovaries identified and appear to be  normal.      Impression IMPRESSION: Fetal heart rate 158 bpm. See above discussion.     US RETROPERITONEUM COMP    Narrative Clinical history: Pyelonephritis, right flank pain    EXAMINATION:  Renal ultrasound 10/09/2017    FINDINGS:  Right kidney measures 11.8 x 4.3 x 4.6 cm. Left kidney measures 11.7 x 6.2 x 5.6  cm. Normal corticomedullary differentiation. No hydronephrosis. Bladder is  unremarkable, prevoid bladder volume 140 mL.      Impression IMPRESSION:  Normal renal ultrasound.           Assessment/Plan: Flank pain, being treated for pyelo, 12 6/7  -continue antiemetics and pain medication.    -perhaps she can be dc'd home with home health for IVF and antiemetics  -she currently has no OBGYN for this pregnancy. She will have to call and make an appointment for prenatal care if she wishes to f/u with our practice.       Golden Poparyn Nya Monds, MD  October 13, 2017  10:28 AM

## 2017-10-13 NOTE — Other (Signed)
Bedside shift change report given to Amy L Runge   (oncoming nurse) by trinidad (offgoing nurse). Report included the following information SBAR.

## 2017-10-13 NOTE — Progress Notes (Signed)
Received report to Trinidad RN from from Gina RN, SBAR reviewed. Assisted with needs, resting in bed. Requested IVF refill from pharmacy.

## 2017-10-13 NOTE — Progress Notes (Signed)
4213-A. Boroff reporting abd cramping started last noc and states she had some blood when she wiped after going to the bathroom. Thanks, Gina [phone]    MD paged with above.

## 2017-10-13 NOTE — Progress Notes (Signed)
Bedside shift change report given to Gina (oncoming nurse) by Selina (offgoing nurse). Report included the following information SBAR, Kardex and MAR.

## 2017-10-13 NOTE — Progress Notes (Signed)
Dr. Hollander notified of pt condition. MD states to continue to monitor.

## 2017-10-13 NOTE — Progress Notes (Signed)
Medical Progress Note      NAME: Linda [name]   DOB:  21-Feb-1991  MRM:  [id number]    Date/Time: 10/13/2017  12:26 PM         Subjective:     27 year old woman with IUP admitted with pyelonephritis. Pt CO flank pain and nausea.     Past Medical History reviewed and unchanged from Admission History and Physical    Review of Systems   Constitutional: Positive for fatigue.   HENT: Negative.    Eyes: Negative.    Respiratory: Negative.    Cardiovascular: Negative.    Gastrointestinal: Positive for nausea.   Endocrine: Negative.    Genitourinary: Positive for flank pain. Negative for dysuria.   Musculoskeletal: Positive for back pain.   Neurological: Negative.    Hematological: Negative.    Psychiatric/Behavioral: Negative.             Objective:       Vitals:      Last 24hrs VS reviewed since prior progress note. Most recent are:    Visit Vitals  BP 123/54 (BP 1 Location: Left arm, BP Patient Position: Supine)   Pulse 95   Temp 98.1 ??F (36.7 ??C)   Resp 20   Ht 5\' 5"  (1.651 m)   Wt 149.7 kg (330 lb)   SpO2 100%   BMI 54.91 kg/m??     SpO2 Readings from Last 6 Encounters:   10/13/17 100%   09/25/17 100%   09/14/17 99%   08/05/17 99%   12/11/15 100%   08/02/15 99%            Intake/Output Summary (Last 24 hours) at 10/13/2017 1306  Last data filed at 10/13/2017 0600  Gross per 24 hour   Intake 5110.41 ml   Output ???   Net 5110.41 ml          Exam:      Physical Exam   Nursing note and vitals reviewed.  Constitutional: She is oriented to person, place, and time. She appears well-developed and well-nourished.   HENT:   Head: Normocephalic and atraumatic.   Eyes: EOM are normal. Pupils are equal, round, and reactive to light.   Neck: Normal range of motion. Neck supple.   Cardiovascular: Normal rate and regular rhythm.   Pulmonary/Chest: Effort normal and breath sounds normal.   Abdominal: Soft. Bowel sounds are normal.   Musculoskeletal: Normal range of motion. She exhibits edema.    Neurological: She is alert and oriented to person, place, and time.   Skin: Skin is warm and dry.       Lab Data Reviewed: (see below)      Medications:  Current Facility-Administered Medications   Medication Dose Route Frequency   ??? promethazine (PHENERGAN) 25 mg in NS IVPB  25 mg IntraVENous Q8H   ??? 0.9% sodium chloride with KCl 40 mEq/L infusion   IntraVENous CONTINUOUS   ??? levothyroxine (SYNTHROID) tablet 75 mcg  75 mcg Oral Q24H   ??? sodium chloride (NS) flush 5-10 mL  5-10 mL IntraVENous Q8H   ??? sodium chloride (NS) flush 5-10 mL  5-10 mL IntraVENous PRN   ??? naloxone (NARCAN) injection 0.1 mg  0.1 mg IntraVENous PRN   ??? cefTRIAXone (ROCEPHIN) 1 g in 0.9% sodium chloride (MBP/ADV) 50 mL MBP  1 g IntraVENous Q24H   ??? ondansetron (ZOFRAN) injection 4 mg  4 mg IntraVENous Q6H PRN   ??? HYDROcodone-acetaminophen (NORCO) 5-325 mg per tablet 1 Tab  1 Tab Oral Q6H PRN   ??? famotidine (PF) (PEPCID) 20 mg in sodium chloride 0.9% 10 mL injection  20 mg IntraVENous Q12H       ______________________________________________________________________      Lab Review:     Recent Labs     10/12/17  1208 10/11/17  1025   WBC 4.6 4.7   HGB 9.7* 10.1*   HCT 28.6* 29.9*   PLT 227 218     Recent Labs     10/13/17  0400 10/12/17  1208 10/11/17  1025   NA 135* 137 140   K 4.3 4.5 3.2*   CL 103 108* 109*   CO2 23 23 21    GLU 80 101 118*   BUN 4* 4* 4*   CREA 0.4* 0.5* 0.5*   CA 8.9 8.8 8.0*   PHOS 4.5 3.2 3.0   ALB 2.7* 2.5* 2.4*          Assessment:     Active Problems:    Back pain (10/09/2017)      Intractable vomiting with nausea (10/09/2017)           Plan:     Risk of deterioration: medium             1. Pyelonephritis. Continue antibiotics and PRN pain meds  2. Dehydration due to N/V continue IV fluids--antiemetics per OB/GYN  3. Hypokalemia. Added KCL to IV fluids  4. IUP per OB GYN  5. Ambulation encouraged         Total time spent with patient: 30 Minutes                   Care Plan discussed with: Patient, Care Manager, Nursing Staff and >50% of time spent in counseling and coordination of care    Discussed:  Care Plan    Prophylaxis:  SCD's    Disposition:  Home w/Family           ___________________________________________________    Attending Physician: Marlene BastJeffrey G Mysti Haley, MD

## 2017-10-13 NOTE — Progress Notes (Signed)
Problem: Falls - Risk of  Goal: *Absence of Falls  Description  Document Schmid Fall Risk and appropriate interventions in the flowsheet.  Outcome: Progressing Towards Goal  Note:   Fall Risk Interventions:            Medication Interventions: Bed/chair exit alarm, Patient to call before getting OOB, Teach patient to arise slowly                   Problem: Patient Education: Go to Patient Education Activity  Goal: Patient/Family Education  Outcome: Progressing Towards Goal     Problem: Pain  Goal: *Control of Pain  Outcome: Progressing Towards Goal     Problem: Patient Education: Go to Patient Education Activity  Goal: Patient/Family Education  Outcome: Progressing Towards Goal

## 2017-10-13 NOTE — Progress Notes (Signed)
Medical Progress Note      NAME: Linda [name]   DOB:  March 30, 1990  MRM:  [id number]    Date/Time: 10/13/2017  12:26 PM         Subjective:     27 year old woman with IUP admitted with pyelonephritis. Pt CO flank pain and nausea.     Past Medical History reviewed and unchanged from Admission History and Physical    Review of Systems   Constitutional: Positive for fatigue.   HENT: Negative.    Eyes: Negative.    Respiratory: Negative.    Cardiovascular: Negative.    Gastrointestinal: Positive for nausea.   Endocrine: Negative.    Genitourinary: Positive for flank pain. Negative for dysuria.   Musculoskeletal: Positive for back pain.   Neurological: Negative.    Hematological: Negative.    Psychiatric/Behavioral: Negative.             Objective:       Vitals:      Last 24hrs VS reviewed since prior progress note. Most recent are:    Visit Vitals  BP 123/54 (BP 1 Location: Left arm, BP Patient Position: Supine)   Pulse 95   Temp 98.1 ??F (36.7 ??C)   Resp 20   Ht 5\' 5"  (1.651 m)   Wt 149.7 kg (330 lb)   SpO2 100%   BMI 54.91 kg/m??     SpO2 Readings from Last 6 Encounters:   10/13/17 100%   09/25/17 100%   09/14/17 99%   08/05/17 99%   12/11/15 100%   08/02/15 99%            Intake/Output Summary (Last 24 hours) at 10/13/2017 1306  Last data filed at 10/13/2017 0600  Gross per 24 hour   Intake 5110.41 ml   Output ???   Net 5110.41 ml          Exam:      Physical Exam   Nursing note and vitals reviewed.  Constitutional: She is oriented to person, place, and time. She appears well-developed and well-nourished.   HENT:   Head: Normocephalic and atraumatic.   Eyes: EOM are normal. Pupils are equal, round, and reactive to light.   Neck: Normal range of motion. Neck supple.   Cardiovascular: Normal rate and regular rhythm.   Pulmonary/Chest: Effort normal and breath sounds normal.   Abdominal: Soft. Bowel sounds are normal.   Musculoskeletal: Normal range of motion. She exhibits edema.   Neurological: She is alert and oriented to  person, place, and time.   Skin: Skin is warm and dry.       Lab Data Reviewed: (see below)      Medications:  Current Facility-Administered Medications   Medication Dose Route Frequency   ??? promethazine (PHENERGAN) 25 mg in NS IVPB  25 mg IntraVENous Q8H   ??? 0.9% sodium chloride with KCl 40 mEq/L infusion   IntraVENous CONTINUOUS   ??? levothyroxine (SYNTHROID) tablet 75 mcg  75 mcg Oral Q24H   ??? sodium chloride (NS) flush 5-10 mL  5-10 mL IntraVENous Q8H   ??? sodium chloride (NS) flush 5-10 mL  5-10 mL IntraVENous PRN   ??? naloxone (NARCAN) injection 0.1 mg  0.1 mg IntraVENous PRN   ??? cefTRIAXone (ROCEPHIN) 1 g in 0.9% sodium chloride (MBP/ADV) 50 mL MBP  1 g IntraVENous Q24H   ??? ondansetron (ZOFRAN) injection 4 mg  4 mg IntraVENous Q6H PRN   ??? HYDROcodone-acetaminophen (NORCO) 5-325 mg per tablet 1 Tab  1 Tab Oral Q6H PRN   ??? famotidine (PF) (PEPCID) 20 mg in sodium chloride 0.9% 10 mL injection  20 mg IntraVENous Q12H       ______________________________________________________________________      Lab Review:     Recent Labs     10/12/17  1208 10/11/17  1025   WBC 4.6 4.7   HGB 9.7* 10.1*   HCT 28.6* 29.9*   PLT 227 218     Recent Labs     10/13/17  0400 10/12/17  1208 10/11/17  1025   NA 135* 137 140   K 4.3 4.5 3.2*   CL 103 108* 109*   CO2 23 23 21    GLU 80 101 118*   BUN 4* 4* 4*   CREA 0.4* 0.5* 0.5*   CA 8.9 8.8 8.0*   PHOS 4.5 3.2 3.0   ALB 2.7* 2.5* 2.4*          Assessment:     Active Problems:    Back pain (10/09/2017)      Intractable vomiting with nausea (10/09/2017)           Plan:     Risk of deterioration: medium             1. Pyelonephritis. Continue antibiotics and PRN pain meds  2. Dehydration due to N/V continue IV fluids--antiemetics per OB/GYN  3. Hypokalemia. Added KCL to IV fluids  4. IUP per OB GYN  5. Ambulation encouraged         Total time spent with patient: 30 Minutes                  Care Plan discussed with: Patient, Care Manager, Nursing Staff and >50% of time spent in counseling and  coordination of care    Discussed:  Care Plan    Prophylaxis:  SCD's    Disposition:  Home w/Family           ___________________________________________________    Attending Physician: Marlene BastJeffrey G Anguel Delapena, MD

## 2017-10-13 NOTE — Progress Notes (Signed)
Inpatient GYN Note    Patient: Linda [name]    MRN: [id number]    Date of Birth: 03-13-1991    Age: 27 y.o.    Admit Date: 10/09/2017    Attending Physician:Holladay, Versie StarksJeffrey G, MD    Subjective: Posey ReaUnsure how [facility] became involved with this unassigned patient. She is NOT a patient in our practice. She is 7712 6/7, admitted for "pyelo" with R flank pain. She has not had fevers but is having nausea/vomiting. This am, she has eaten her entire breakfast, but feels like she wants to vomit. She denies HG in previous pregnancies. Her pain on the R flank is improved, but now has pain on the L flank. She is getting rocephin daily. Afebrile for >24h and no elevated WBC count.     This am, had some pelvic cramping and spotting with wiping only.     Current Problems/Procedures:   Hospital Problems as of 10/13/2017 Date Reviewed: 10/27/2014          Codes Class Noted - Resolved POA    Back pain ICD-10-CM: M54.9  ICD-9-CM: 724.5  10/09/2017 - Present Unknown        Intractable vomiting with nausea ICD-10-CM: R11.2  ICD-9-CM: 536.2  10/09/2017 - Present Unknown              Active Medications:   Current Facility-Administered Medications   Medication Dose Route Frequency   ??? promethazine (PHENERGAN) 25 mg in NS IVPB  25 mg IntraVENous Q8H   ??? 0.9% sodium chloride with KCl 40 mEq/L infusion   IntraVENous CONTINUOUS   ??? levothyroxine (SYNTHROID) tablet 75 mcg  75 mcg Oral Q24H   ??? sodium chloride (NS) flush 5-10 mL  5-10 mL IntraVENous Q8H   ??? sodium chloride (NS) flush 5-10 mL  5-10 mL IntraVENous PRN   ??? naloxone (NARCAN) injection 0.1 mg  0.1 mg IntraVENous PRN   ??? cefTRIAXone (ROCEPHIN) 1 g in 0.9% sodium chloride (MBP/ADV) 50 mL MBP  1 g IntraVENous Q24H   ??? ondansetron (ZOFRAN) injection 4 mg  4 mg IntraVENous Q6H PRN   ??? HYDROcodone-acetaminophen (NORCO) 5-325 mg per tablet 1 Tab  1 Tab Oral Q6H PRN   ??? famotidine (PF) (PEPCID) 20 mg in sodium chloride 0.9% 10 mL injection  20 mg IntraVENous Q12H       Vital Signs:  Vitals:     10/12/17 2014 10/13/17 0015 10/13/17 0427 10/13/17 0806   BP: 129/57 127/45 120/65 146/62   Pulse: 85 77 72 80   Resp: 23 16 17 16    Temp: 98.1 ??F (36.7 ??C) 98.2 ??F (36.8 ??C) 98.2 ??F (36.8 ??C) 98 ??F (36.7 ??C)   SpO2: 100% 100% 100% 100%   Weight:       Height:           Physical Exam: GENERAL: alert, cooperative, no distress, appears stated age  LUNG: clear to auscultation bilaterally  HEART: regular rate and rhythm, S1, S2 normal, no murmur, click, rub or gallop  ABDOMEN: soft, non-tender. Bowel sounds normal. No masses,  no organomegaly    Laboratory: No results found.    Radiology:        US Results (maximum last 3):  Results from Hospital Encounter encounter on 10/09/17   US PELV NON OBS  LTD    Narrative Pregnancy ultrasound. Comparison 09/25/2017.    INDICATION: Fetal heart rate.    FINDINGS: Fetal heart rate 158 bpm. Previous fetal heart rate 142 bpm.  Crown-rump  length 12 weeks 3 days. Both ovaries identified and appear to be  normal.      Impression IMPRESSION: Fetal heart rate 158 bpm. See above discussion.     [place] PREG UTS < 14 WKS SNGL    Narrative Pregnancy ultrasound. Comparison 09/25/2017.    INDICATION: Fetal heart rate.    FINDINGS: Fetal heart rate 158 bpm. Previous fetal heart rate 142 bpm.  Crown-rump length 12 weeks 3 days. Both ovaries identified and appear to be  normal.      Impression IMPRESSION: Fetal heart rate 158 bpm. See above discussion.     [place] RETROPERITONEUM COMP    Narrative Clinical history: Pyelonephritis, right flank pain    EXAMINATION:  Renal ultrasound 10/09/2017    FINDINGS:  Right kidney measures 11.8 x 4.3 x 4.6 cm. Left kidney measures 11.7 x 6.2 x 5.6  cm. Normal corticomedullary differentiation. No hydronephrosis. Bladder is  unremarkable, prevoid bladder volume 140 mL.      Impression IMPRESSION:  Normal renal ultrasound.           Assessment/Plan: Flank pain, being treated for pyelo, 12 6/7  -continue antiemetics and pain medication.   -perhaps she can be dc'd home with  home health for IVF and antiemetics  -she currently has no OBGYN for this pregnancy. She will have to call and make an appointment for prenatal care if she wishes to f/u with our practice.       Golden Pop, MD  October 13, 2017  10:28 AM

## 2017-10-13 NOTE — Progress Notes (Signed)
Dr. Nelle Don notified of pt condition. MD states to continue to monitor.

## 2017-10-13 NOTE — Progress Notes (Signed)
4213-A. Linda [name] reporting abd cramping started last noc and states she had some blood when she wiped after going to the bathroom. Thanks, Almira CoasterGina [phone]    MD paged with above.

## 2017-10-13 NOTE — Progress Notes (Signed)
Received report to [place] RN from from [employer] RN, SBAR reviewed. Assisted with needs, resting in bed. Requested IVF refill from pharmacy.

## 2017-10-13 NOTE — Progress Notes (Signed)
Patient refusing lab draw this am.  0730  Clarification was given patient will only allow PICC team to draw labs. PICC team was notified.

## 2017-10-14 ENCOUNTER — Inpatient Hospital Stay: Admit: 2017-10-14 | Payer: MEDICAID

## 2017-10-14 LAB — RENAL FUNCTION PANEL
Albumin: 2.7 gm/dl — ABNORMAL LOW (ref 3.4–5.0)
Albumin: 2.7 gm/dl — ABNORMAL LOW (ref 3.4–5.0)
Anion Gap: 6 mmol/L (ref 5–15)
Anion gap: 6 mmol/L (ref 5–15)
BUN: 10 mg/dl (ref 7–25)
BUN: 10 mg/dl (ref 7–25)
CO2: 24 mEq/L (ref 21–32)
CO2: 24 mEq/L (ref 21–32)
Calcium: 9.1 mg/dl (ref 8.5–10.1)
Calcium: 9.1 mg/dl (ref 8.5–10.1)
Chloride: 104 mEq/L (ref 98–107)
Chloride: 104 mEq/L (ref 98–107)
Creatinine: 0.5 mg/dl — ABNORMAL LOW (ref 0.6–1.3)
Creatinine: 0.5 mg/dl — ABNORMAL LOW (ref 0.6–1.3)
EGFR IF NonAfrican American: >60
GFR African American: >60
GFR est AA: >60
GFR est non-AA: >60
Glucose: 89 mg/dl (ref 74–106)
Glucose: 89 mg/dl (ref 74–106)
Phosphorus: 4.8 mg/dl (ref 2.5–4.9)
Phosphorus: 4.8 mg/dl (ref 2.5–4.9)
Potassium: 4.2 mEq/L (ref 3.5–5.1)
Potassium: 4.2 mEq/L (ref 3.5–5.1)
Sodium: 135 mEq/L — ABNORMAL LOW (ref 136–145)
Sodium: 135 mEq/L — ABNORMAL LOW (ref 136–145)

## 2017-10-14 LAB — CULTURE, BLOOD

## 2017-10-14 LAB — CULTURE, BLOOD 1

## 2017-10-14 MED ORDER — LABETALOL 100 MG TAB
100 mg | Freq: Every day | ORAL | Status: DC
Start: 2017-10-14 — End: 2017-10-15
  Administered 2017-10-14 – 2017-10-15 (×2): via ORAL

## 2017-10-14 MED FILL — PROMETHAZINE IN NS 25 MG/50 ML IV PIGGY BAG: 25 mg/50 ml | INTRAVENOUS | Qty: 50

## 2017-10-14 MED FILL — NS WITH POTASSIUM CHLORIDE 40 MEQ/L IV: 40 mEq/L | INTRAVENOUS | Qty: 1000

## 2017-10-14 MED FILL — HYDROCODONE-ACETAMINOPHEN 5 MG-325 MG TAB: 5-325 mg | ORAL | Qty: 1

## 2017-10-14 MED FILL — FAMOTIDINE (PF) 20 MG/2 ML IV: 20 mg/2 mL | INTRAVENOUS | Qty: 2

## 2017-10-14 MED FILL — LABETALOL 100 MG TAB: 100 mg | ORAL | Qty: 1

## 2017-10-14 MED FILL — LEVOTHYROXINE 25 MCG TAB: 25 mcg | ORAL | Qty: 1

## 2017-10-14 MED FILL — ONDANSETRON (PF) 4 MG/2 ML INJECTION: 4 mg/2 mL | INTRAMUSCULAR | Qty: 2

## 2017-10-14 NOTE — Other (Signed)
Dr. Renita Papawhitted made aware of attempt to obtain fetal heart rate. Made aware of positioning and attempt for at least 20 minutes or more. He states she has an ultrasound ordered.

## 2017-10-14 NOTE — Progress Notes (Signed)
Medical Progress Note      NAME: Linda [name]   DOB:  Aug 17, 1990  MRM:  [id number]    Date/Time: 10/14/2017  12:26 PM         Subjective:     27 year old woman with IUP admitted with pyelonephritis. Pt CO flank pain and nausea.     Past Medical History reviewed and unchanged from Admission History and Physical    Review of Systems   Constitutional: Positive for fatigue.   HENT: Negative.    Eyes: Negative.    Respiratory: Negative.    Cardiovascular: Negative.    Gastrointestinal: Positive for nausea.   Endocrine: Negative.    Genitourinary: Positive for flank pain. Negative for dysuria.   Musculoskeletal: Positive for back pain.   Neurological: Negative.    Hematological: Negative.    Psychiatric/Behavioral: Negative.             Objective:       Vitals:      Last 24hrs VS reviewed since prior progress note. Most recent are:    Visit Vitals  BP 142/63 (BP 1 Location: Left arm, BP Patient Position: Supine)   Pulse 83   Temp 98.1 ??F (36.7 ??C)   Resp 19   Ht 5\' 5"  (1.651 m)   Wt 149.7 kg (330 lb)   SpO2 98%   BMI 54.91 kg/m??     SpO2 Readings from Last 6 Encounters:   10/14/17 98%   09/25/17 100%   09/14/17 99%   08/05/17 99%   12/11/15 100%   08/02/15 99%            Intake/Output Summary (Last 24 hours) at 10/14/2017 1712  Last data filed at 10/14/2017 1525  Gross per 24 hour   Intake 4357.91 ml   Output 500 ml   Net 3857.91 ml          Exam:      Physical Exam   Nursing note and vitals reviewed.  Constitutional: She is oriented to person, place, and time. She appears well-developed and well-nourished.   HENT:   Head: Normocephalic and atraumatic.   Eyes: EOM are normal. Pupils are equal, round, and reactive to light.   Neck: Normal range of motion. Neck supple.   Cardiovascular: Normal rate and regular rhythm.   Pulmonary/Chest: Effort normal and breath sounds normal.   Abdominal: Soft. Bowel sounds are normal.   Musculoskeletal: Normal range of motion. She exhibits edema.    Neurological: She is alert and oriented to person, place, and time.   Skin: Skin is warm and dry.       Lab Data Reviewed: (see below)      Medications:  Current Facility-Administered Medications   Medication Dose Route Frequency   ??? labetalol (NORMODYNE) tablet 100 mg  100 mg Oral DAILY   ??? promethazine (PHENERGAN) 25 mg in NS IVPB  25 mg IntraVENous Q8H   ??? 0.9% sodium chloride with KCl 40 mEq/L infusion   IntraVENous CONTINUOUS   ??? levothyroxine (SYNTHROID) tablet 75 mcg  75 mcg Oral Q24H   ??? sodium chloride (NS) flush 5-10 mL  5-10 mL IntraVENous Q8H   ??? sodium chloride (NS) flush 5-10 mL  5-10 mL IntraVENous PRN   ??? naloxone (NARCAN) injection 0.1 mg  0.1 mg IntraVENous PRN   ??? cefTRIAXone (ROCEPHIN) 1 g in 0.9% sodium chloride (MBP/ADV) 50 mL MBP  1 g IntraVENous Q24H   ??? ondansetron (ZOFRAN) injection 4 mg  4 mg  IntraVENous Q6H PRN   ??? HYDROcodone-acetaminophen (NORCO) 5-325 mg per tablet 1 Tab  1 Tab Oral Q6H PRN   ??? famotidine (PF) (PEPCID) 20 mg in sodium chloride 0.9% 10 mL injection  20 mg IntraVENous Q12H       ______________________________________________________________________      Lab Review:     Recent Labs     10/12/17  1208   WBC 4.6   HGB 9.7*   HCT 28.6*   PLT 227     Recent Labs     10/14/17  0436 10/13/17  0400 10/12/17  1208   NA 135* 135* 137   K 4.2 4.3 4.5   CL 104 103 108*   CO2 24 23 23    GLU 89 80 101   BUN 10 4* 4*   CREA 0.5* 0.4* 0.5*   CA 9.1 8.9 8.8   PHOS 4.8 4.5 3.2   ALB 2.7* 2.7* 2.5*          Assessment:     Active Problems:    Back pain (10/09/2017)      Intractable vomiting with nausea (10/09/2017)           Plan:     Risk of deterioration: medium             1. Pyelonephritis. Continue antibiotics and PRN pain meds  2. Dehydration due to N/V continue IV fluids--antiemetics per OB/GYN  3. Hypokalemia. Added KCL to IV fluids  4. IUP per OB GYN  5. Ambulation encouraged  6. 24 hour urine ordered per OBGYN.    7. Still nauseated and unable to tolerate PO meds including antibiotics. Will continue IV antibiotics.          Total time spent with patient: 30 Minutes                  Care Plan discussed with: Patient, Care Manager, Nursing Staff and >50% of time spent in counseling and coordination of care    Discussed:  Care Plan    Prophylaxis:  SCD's    Disposition:  Home w/Family           ___________________________________________________    Attending Physician: Marlene Bast, MD

## 2017-10-14 NOTE — Progress Notes (Addendum)
Obstetrics Progress Note    Name: Linda [name] MRN: [id number]  SSN: [id number]    Date of Birth: 01/12/1991  Age: 26 y.o.  Sex: female     LOS: 5 days   Estimated Date of Delivery: None noted.  Gestational Age Today: Unknown      Subjective:     Patient admitted for pyelonephritis. States she  does have mild vaginal bleeding and Bilateral back pain.    Objective:     Vitals:  Blood pressure 142/49, pulse 87, temperature 98 ??F (36.7 ??C), resp. rate 20, height 5' 5" (1.651 m), weight 149.7 kg (330 lb), last menstrual period 08/14/2017, SpO2 97 %, unknown if currently breastfeeding.   Temp (24hrs), Avg:98 ??F (36.7 ??C), Min:97.9 ??F (36.6 ??C), Max:98.2 ??F (36.8 ??C)    Systolic (24hrs), Avg:144 , Min:107 , Max:178      Diastolic (24hrs), Avg:64, Min:49, Max:78       Intake and Output:     Date 10/14/17 0700 - 10/15/17 0659   Shift 0700-1459 1500-2259 2300-0659 24 Hour Total   INTAKE   P.O. 120   120   Shift Total(mL/kg) 120(0.8)   120(0.8)   OUTPUT   Shift Total(mL/kg)       Weight (kg) 149.7 149.7 149.7 149.7       Physical Exam:  Back: costovertebral angle tenderness present       Membranes:  Intact    Uterine Activity:  None    Fetal Heart Rate: Not done today      Labs:   Recent Results (from the past 36 hour(s))   RENAL FUNCTION PANEL    Collection Time: 10/13/17  4:00 AM   Result Value Ref Range    Sodium 135 (L) 136 - 145 mEq/L    Potassium 4.3 3.5 - 5.1 mEq/L    Chloride 103 98 - 107 mEq/L    CO2 23 21 - 32 mEq/L    Glucose 80 74 - 106 mg/dl    BUN 4 (L) 7 - 25 mg/dl    Creatinine 0.4 (L) 0.6 - 1.3 mg/dl    GFR est AA >60.0      GFR est non-AA >60      Calcium 8.9 8.5 - 10.1 mg/dl    Albumin 2.7 (L) 3.4 - 5.0 gm/dl    Phosphorus 4.5 2.5 - 4.9 mg/dl    Anion gap 8 5 - 15 mmol/L   RENAL FUNCTION PANEL    Collection Time: 10/14/17  4:36 AM   Result Value Ref Range    Sodium 135 (L) 136 - 145 mEq/L    Potassium 4.2 3.5 - 5.1 mEq/L    Chloride 104 98 - 107 mEq/L    CO2 24 21 - 32 mEq/L    Glucose 89 74 - 106 mg/dl     BUN 10 7 - 25 mg/dl    Creatinine 0.5 (L) 0.6 - 1.3 mg/dl    GFR est AA >60.0      GFR est non-AA >60      Calcium 9.1 8.5 - 10.1 mg/dl    Albumin 2.7 (L) 3.4 - 5.0 gm/dl    Phosphorus 4.8 2.5 - 4.9 mg/dl    Anion gap 6 5 - 15 mmol/L       Assessment and Plan:      Active Problems:    Back pain (10/09/2017)      Intractable vomiting with nausea (10/09/2017)       Pyelonephritis treatment per medicine   service.  The patient's nurse was asked to obtain a fetal heart rate.  A pelvic ultrasound examination will be obtained secondary to vaginal bleeding.  The patient will be started on labetalol secondary to elevated systolic blood pressures.  Also a 24-hour urine collection for total protein and creatinine clearance will be obtained to be used as a baseline.    Signed By: Iliani Vejar L Eylin Pontarelli, MD     October 14, 2017

## 2017-10-14 NOTE — Progress Notes (Signed)
Problem: Falls - Risk of  Goal: *Absence of Falls  Description  Document Schmid Fall Risk and appropriate interventions in the flowsheet.  Note:   Fall Risk Interventions:            Medication Interventions: Patient to call before getting OOB, Teach patient to arise slowly

## 2017-10-14 NOTE — Other (Signed)
Bedside shift change report given to Amy L Runge   (oncoming nurse) by tabitha (offgoing nurse). Report included the following information SBAR.

## 2017-10-14 NOTE — Progress Notes (Signed)
Problem: Falls - Risk of  Goal: *Absence of Falls  Description  Document Schmid Fall Risk and appropriate interventions in the flowsheet.  Outcome: Progressing Towards Goal  Note:   Fall Risk Interventions:            Medication Interventions: Teach patient to arise slowly, Patient to call before getting OOB                   Problem: Patient Education: Go to Patient Education Activity  Goal: Patient/Family Education  Outcome: Progressing Towards Goal     Problem: Pain  Goal: *Control of Pain  Outcome: Progressing Towards Goal     Problem: Patient Education: Go to Patient Education Activity  Goal: Patient/Family Education  Outcome: Progressing Towards Goal

## 2017-10-14 NOTE — Progress Notes (Signed)
24 hour urine start collection.

## 2017-10-14 NOTE — Progress Notes (Signed)
Medical Progress Note      NAME: Linda [name]   DOB:  1991-02-08  MRM:  [id number]    Date/Time: 10/14/2017  12:26 PM         Subjective:     27 year old woman with IUP admitted with pyelonephritis. Pt CO flank pain and nausea.     Past Medical History reviewed and unchanged from Admission History and Physical    Review of Systems   Constitutional: Positive for fatigue.   HENT: Negative.    Eyes: Negative.    Respiratory: Negative.    Cardiovascular: Negative.    Gastrointestinal: Positive for nausea.   Endocrine: Negative.    Genitourinary: Positive for flank pain. Negative for dysuria.   Musculoskeletal: Positive for back pain.   Neurological: Negative.    Hematological: Negative.    Psychiatric/Behavioral: Negative.             Objective:       Vitals:      Last 24hrs VS reviewed since prior progress note. Most recent are:    Visit Vitals  BP 142/63 (BP 1 Location: Left arm, BP Patient Position: Supine)   Pulse 83   Temp 98.1 ??F (36.7 ??C)   Resp 19   Ht 5\' 5"  (1.651 m)   Wt 149.7 kg (330 lb)   SpO2 98%   BMI 54.91 kg/m??     SpO2 Readings from Last 6 Encounters:   10/14/17 98%   09/25/17 100%   09/14/17 99%   08/05/17 99%   12/11/15 100%   08/02/15 99%            Intake/Output Summary (Last 24 hours) at 10/14/2017 1712  Last data filed at 10/14/2017 1525  Gross per 24 hour   Intake 4357.91 ml   Output 500 ml   Net 3857.91 ml          Exam:      Physical Exam   Nursing note and vitals reviewed.  Constitutional: She is oriented to person, place, and time. She appears well-developed and well-nourished.   HENT:   Head: Normocephalic and atraumatic.   Eyes: EOM are normal. Pupils are equal, round, and reactive to light.   Neck: Normal range of motion. Neck supple.   Cardiovascular: Normal rate and regular rhythm.   Pulmonary/Chest: Effort normal and breath sounds normal.   Abdominal: Soft. Bowel sounds are normal.   Musculoskeletal: Normal range of motion. She exhibits edema.   Neurological: She is alert and oriented to  person, place, and time.   Skin: Skin is warm and dry.       Lab Data Reviewed: (see below)      Medications:  Current Facility-Administered Medications   Medication Dose Route Frequency   ??? labetalol (NORMODYNE) tablet 100 mg  100 mg Oral DAILY   ??? promethazine (PHENERGAN) 25 mg in NS IVPB  25 mg IntraVENous Q8H   ??? 0.9% sodium chloride with KCl 40 mEq/L infusion   IntraVENous CONTINUOUS   ??? levothyroxine (SYNTHROID) tablet 75 mcg  75 mcg Oral Q24H   ??? sodium chloride (NS) flush 5-10 mL  5-10 mL IntraVENous Q8H   ??? sodium chloride (NS) flush 5-10 mL  5-10 mL IntraVENous PRN   ??? naloxone (NARCAN) injection 0.1 mg  0.1 mg IntraVENous PRN   ??? cefTRIAXone (ROCEPHIN) 1 g in 0.9% sodium chloride (MBP/ADV) 50 mL MBP  1 g IntraVENous Q24H   ??? ondansetron (ZOFRAN) injection 4 mg  4 mg  IntraVENous Q6H PRN   ??? HYDROcodone-acetaminophen (NORCO) 5-325 mg per tablet 1 Tab  1 Tab Oral Q6H PRN   ??? famotidine (PF) (PEPCID) 20 mg in sodium chloride 0.9% 10 mL injection  20 mg IntraVENous Q12H       ______________________________________________________________________      Lab Review:     Recent Labs     10/12/17  1208   WBC 4.6   HGB 9.7*   HCT 28.6*   PLT 227     Recent Labs     10/14/17  0436 10/13/17  0400 10/12/17  1208   NA 135* 135* 137   K 4.2 4.3 4.5   CL 104 103 108*   CO2 24 23 23    GLU 89 80 101   BUN 10 4* 4*   CREA 0.5* 0.4* 0.5*   CA 9.1 8.9 8.8   PHOS 4.8 4.5 3.2   ALB 2.7* 2.7* 2.5*          Assessment:     Active Problems:    Back pain (10/09/2017)      Intractable vomiting with nausea (10/09/2017)           Plan:     Risk of deterioration: medium             1. Pyelonephritis. Continue antibiotics and PRN pain meds  2. Dehydration due to N/V continue IV fluids--antiemetics per OB/GYN  3. Hypokalemia. Added KCL to IV fluids  4. IUP per OB GYN  5. Ambulation encouraged  6. 24 hour urine ordered per OBGYN.   7. Still nauseated and unable to tolerate PO meds including antibiotics. Will continue IV antibiotics.           Total time spent with patient: 30 Minutes                  Care Plan discussed with: Patient, Care Manager, Nursing Staff and >50% of time spent in counseling and coordination of care    Discussed:  Care Plan    Prophylaxis:  SCD's    Disposition:  Home w/Family           ___________________________________________________    Attending Physician: Marlene BastJeffrey G Avyn Aden, MD

## 2017-10-14 NOTE — Progress Notes (Signed)
Obstetrics Progress Note    Name: Linda [name] MRN: [id number]  SSN: [id number]    Date of Birth: May 10, 1990  Age: 27 y.o.  Sex: female     LOS: 5 days   Estimated Date of Delivery: None noted.  Gestational Age Today: Unknown      Subjective:     Patient admitted for pyelonephritis. States she  does have mild vaginal bleeding and Bilateral back pain.    Objective:     Vitals:  Blood pressure 142/49, pulse 87, temperature 98 ??F (36.7 ??C), resp. rate 20, height 5\' 5"  (1.651 m), weight 149.7 kg (330 lb), last menstrual period 08/14/2017, SpO2 97 %, unknown if currently breastfeeding.   Temp (24hrs), Avg:98 ??F (36.7 ??C), Min:97.9 ??F (36.6 ??C), Max:98.2 ??F (36.8 ??C)    Systolic (24hrs), Avg:144 , Min:107 , Max:178      Diastolic (24hrs), Avg:64, Min:49, Max:78       Intake and Output:     Date 10/14/17 0700 - 10/15/17 0659   Shift 0700-1459 1500-2259 2300-0659 24 Hour Total   INTAKE   P.O. 120   120   Shift Total(mL/kg) 120(0.8)   120(0.8)   OUTPUT   Shift Total(mL/kg)       Weight (kg) 149.7 149.7 149.7 149.7       Physical Exam:  Back: costovertebral angle tenderness present       Membranes:  Intact    Uterine Activity:  None    Fetal Heart Rate: Not done today      Labs:   Recent Results (from the past 36 hour(s))   RENAL FUNCTION PANEL    Collection Time: 10/13/17  4:00 AM   Result Value Ref Range    Sodium 135 (L) 136 - 145 mEq/L    Potassium 4.3 3.5 - 5.1 mEq/L    Chloride 103 98 - 107 mEq/L    CO2 23 21 - 32 mEq/L    Glucose 80 74 - 106 mg/dl    BUN 4 (L) 7 - 25 mg/dl    Creatinine 0.4 (L) 0.6 - 1.3 mg/dl    GFR est AA >82.9      GFR est non-AA >60      Calcium 8.9 8.5 - 10.1 mg/dl    Albumin 2.7 (L) 3.4 - 5.0 gm/dl    Phosphorus 4.5 2.5 - 4.9 mg/dl    Anion gap 8 5 - 15 mmol/L   RENAL FUNCTION PANEL    Collection Time: 10/14/17  4:36 AM   Result Value Ref Range    Sodium 135 (L) 136 - 145 mEq/L    Potassium 4.2 3.5 - 5.1 mEq/L    Chloride 104 98 - 107 mEq/L    CO2 24 21 - 32 mEq/L    Glucose 89 74 - 106 mg/dl     BUN 10 7 - 25 mg/dl    Creatinine 0.5 (L) 0.6 - 1.3 mg/dl    GFR est AA >56.2      GFR est non-AA >60      Calcium 9.1 8.5 - 10.1 mg/dl    Albumin 2.7 (L) 3.4 - 5.0 gm/dl    Phosphorus 4.8 2.5 - 4.9 mg/dl    Anion gap 6 5 - 15 mmol/L       Assessment and Plan:      Active Problems:    Back pain (10/09/2017)      Intractable vomiting with nausea (10/09/2017)       Pyelonephritis treatment per medicine  service.  The patient's nurse was asked to obtain a fetal heart rate.  A pelvic ultrasound examination will be obtained secondary to vaginal bleeding.  The patient will be started on labetalol secondary to elevated systolic blood pressures.  Also a 24-hour urine collection for total protein and creatinine clearance will be obtained to be used as a baseline.    Signed By: Wynetta FinesMatthew L Willys Salvino, MD     October 14, 2017

## 2017-10-14 NOTE — Progress Notes (Signed)
24 hour urine start collection.

## 2017-10-14 NOTE — Progress Notes (Signed)
Problem: Falls - Risk of  Goal: *Absence of Falls  Description: Document Linda [name] Fall Risk and appropriate interventions in the flowsheet.  Outcome: Progressing Towards Goal  Note: Fall Risk Interventions:            Medication Interventions: Teach patient to arise slowly,Patient to call before getting OOB                   Problem: Patient Education: Go to Patient Education Activity  Goal: Patient/Family Education  Outcome: Progressing Towards Goal     Problem: Pain  Goal: *Control of Pain  Outcome: Progressing Towards Goal     Problem: Patient Education: Go to Patient Education Activity  Goal: Patient/Family Education  Outcome: Progressing Towards Goal

## 2017-10-15 LAB — URINALYSIS W/ RFLX MICROSCOPIC
Bilirubin, Urine: NEGATIVE
Bilirubin: NEGATIVE
Blood, Urine: NEGATIVE
Blood: NEGATIVE
Glucose, Ur: NEGATIVE mg/dl
Glucose: NEGATIVE mg/dl
Ketone: NEGATIVE mg/dl
Ketones, Urine: NEGATIVE mg/dl
Leukocyte Esterase, Urine: NEGATIVE
Leukocyte Esterase: NEGATIVE
Nitrite, Urine: NEGATIVE
Nitrites: NEGATIVE
Protein, UA: NEGATIVE mg/dl
Protein: NEGATIVE mg/dl
Specific Gravity, UA: 1.01 (ref 1.005–1.030)
Specific gravity: 1.01 (ref 1.005–1.030)
Urobilinogen, UA, POCT: 0.2 mg/dl (ref 0.0–1.0)
Urobilinogen: 0.2 mg/dl (ref 0.0–1.0)
pH (UA): 7 (ref 5.0–9.0)
pH, UA: 7 (ref 5.0–9.0)

## 2017-10-15 LAB — RENAL FUNCTION PANEL
Albumin: 2.8 gm/dl — ABNORMAL LOW (ref 3.4–5.0)
Albumin: 2.8 gm/dl — ABNORMAL LOW (ref 3.4–5.0)
Anion Gap: 8 mmol/L (ref 5–15)
Anion gap: 8 mmol/L (ref 5–15)
BUN: 7 mg/dl (ref 7–25)
BUN: 7 mg/dl (ref 7–25)
CO2: 24 mEq/L (ref 21–32)
CO2: 24 mEq/L (ref 21–32)
Calcium: 9.2 mg/dl (ref 8.5–10.1)
Calcium: 9.2 mg/dl (ref 8.5–10.1)
Chloride: 104 mEq/L (ref 98–107)
Chloride: 104 mEq/L (ref 98–107)
Creatinine: 0.5 mg/dl — ABNORMAL LOW (ref 0.6–1.3)
Creatinine: 0.5 mg/dl — ABNORMAL LOW (ref 0.6–1.3)
EGFR IF NonAfrican American: >60
GFR African American: >60
GFR est AA: >60
GFR est non-AA: >60
Glucose: 89 mg/dl (ref 74–106)
Glucose: 89 mg/dl (ref 74–106)
Phosphorus: 4.7 mg/dl (ref 2.5–4.9)
Phosphorus: 4.7 mg/dl (ref 2.5–4.9)
Potassium: 4.3 mEq/L (ref 3.5–5.1)
Potassium: 4.3 mEq/L (ref 3.5–5.1)
Sodium: 136 mEq/L (ref 136–145)
Sodium: 136 mEq/L (ref 136–145)

## 2017-10-15 LAB — CULTURE, BLOOD: Blood Culture Result: NO GROWTH

## 2017-10-15 LAB — CULTURE, BLOOD 1: BLOOD CULTURE RESULT: NO GROWTH

## 2017-10-15 MED ORDER — POLYETHYLENE GLYCOL 3350 17 GRAM (100 %) ORAL POWDER PACKET
17 gram | Freq: Every day | ORAL | Status: DC
Start: 2017-10-15 — End: 2017-10-17
  Administered 2017-10-15: 17:00:00 via ORAL

## 2017-10-15 MED ORDER — LABETALOL 100 MG TAB
100 mg | Freq: Two times a day (BID) | ORAL | Status: DC
Start: 2017-10-15 — End: 2017-10-16
  Administered 2017-10-16 (×2): via ORAL

## 2017-10-15 MED ORDER — PROMETHAZINE IN NS 25 MG/50 ML IV PIGGY BAG
25 mg/50 ml | Freq: Three times a day (TID) | INTRAVENOUS | Status: DC | PRN
Start: 2017-10-15 — End: 2017-10-16
  Administered 2017-10-15 – 2017-10-16 (×2): via INTRAVENOUS

## 2017-10-15 MED ORDER — BISACODYL 10 MG RECTAL SUPPOSITORY
10 mg | Freq: Every day | RECTAL | Status: DC | PRN
Start: 2017-10-15 — End: 2017-10-17

## 2017-10-15 MED ORDER — CYCLOBENZAPRINE 10 MG TAB
10 mg | Freq: Every evening | ORAL | Status: DC
Start: 2017-10-15 — End: 2017-10-17
  Administered 2017-10-16 – 2017-10-17 (×2): via ORAL

## 2017-10-15 MED ORDER — BISACODYL 5 MG TAB, DELAYED RELEASE
5 mg | Freq: Every day | ORAL | Status: DC | PRN
Start: 2017-10-15 — End: 2017-10-17
  Administered 2017-10-15: 19:00:00 via ORAL

## 2017-10-15 MED ORDER — MAGNESIUM HYDROXIDE 400 MG/5 ML ORAL SUSP
400 mg/5 mL | Freq: Every day | ORAL | Status: DC | PRN
Start: 2017-10-15 — End: 2017-10-17

## 2017-10-15 MED FILL — HYDROCODONE-ACETAMINOPHEN 5 MG-325 MG TAB: 5-325 mg | ORAL | Qty: 1

## 2017-10-15 MED FILL — NS WITH POTASSIUM CHLORIDE 40 MEQ/L IV: 40 mEq/L | INTRAVENOUS | Qty: 1000

## 2017-10-15 MED FILL — CEFTRIAXONE 1 GRAM SOLUTION FOR INJECTION: 1 gram | INTRAMUSCULAR | Qty: 1

## 2017-10-15 MED FILL — LABETALOL 100 MG TAB: 100 mg | ORAL | Qty: 1

## 2017-10-15 MED FILL — PROMETHAZINE IN NS 25 MG/50 ML IV PIGGY BAG: 25 mg/50 ml | INTRAVENOUS | Qty: 50

## 2017-10-15 MED FILL — BISACODYL 5 MG TAB, DELAYED RELEASE: 5 mg | ORAL | Qty: 1

## 2017-10-15 MED FILL — FAMOTIDINE (PF) 20 MG/2 ML IV: 20 mg/2 mL | INTRAVENOUS | Qty: 2

## 2017-10-15 MED FILL — POLYETHYLENE GLYCOL 3350 17 GRAM (100 %) ORAL POWDER PACKET: 17 gram | ORAL | Qty: 1

## 2017-10-15 MED FILL — LEVOTHYROXINE 25 MCG TAB: 25 mcg | ORAL | Qty: 1

## 2017-10-15 NOTE — Progress Notes (Signed)
1246-  PAGER ID: [phone]   MESSAGE: Pt in rm [id number] Linda [name] is complaining of constipation no bm in 2 days. Thank you. Shaniqua Murphy [phone]

## 2017-10-15 NOTE — Progress Notes (Signed)
Called Lab about new U/A order. Lab stated it would be added from the urine sent at 1500.

## 2017-10-15 NOTE — Progress Notes (Signed)
Problem: Falls - Risk of  Goal: *Absence of Falls  Description  Document Schmid Fall Risk and appropriate interventions in the flowsheet.  10/15/2017 0325 by Runge, Amy L  Outcome: Progressing Towards Goal  Note:   Fall Risk Interventions:            Medication Interventions: Patient to call before getting OOB, Teach patient to arise slowly                10/15/2017 0325 by Runge, Amy L  Outcome: Progressing Towards Goal  Note:   Fall Risk Interventions:            Medication Interventions: Patient to call before getting OOB, Teach patient to arise slowly                   Problem: Patient Education: Go to Patient Education Activity  Goal: Patient/Family Education  10/15/2017 0325 by Runge, Amy L  Outcome: Progressing Towards Goal  10/15/2017 0325 by Runge, Amy L  Outcome: Progressing Towards Goal     Problem: Pain  Goal: *Control of Pain  10/15/2017 0325 by Runge, Amy L  Outcome: Progressing Towards Goal  10/15/2017 0325 by Runge, Amy L  Outcome: Progressing Towards Goal     Problem: Patient Education: Go to Patient Education Activity  Goal: Patient/Family Education  10/15/2017 0325 by Runge, Amy L  Outcome: Progressing Towards Goal  10/15/2017 0325 by Runge, Amy L  Outcome: Progressing Towards Goal

## 2017-10-15 NOTE — Progress Notes (Addendum)
Orders received to d/c IV fluids and change phenergan IV to Q8hrs PRN. Orders carried out. Pt notified with expressed understanding.

## 2017-10-15 NOTE — Progress Notes (Signed)
Obstetrics Progress Note    Name: Linda [name] MRN: [id number]  SSN: [id number]    Date of Birth: September 08, 1990  Age: 27 y.o.  Sex: female      Subjective:      LOS: 6 days    Estimated Date of Delivery:  04/21/2018  Gestational Age Today: 13 1/7 weeks     Patient seen today has no fever or chills ,she had bm yesterday ,her pain is less than before but still there she feels when her  promethazine wear off ,she feels sleepy  ,tired ,she kept her breakfast and lunch ,dinner yesterday ,vomited once middle of night after she ate cereal with milk . Kept her breakfast toady .     Objective:     Vitals:  Blood pressure 142/42, pulse 94, temperature 98.1 ??F (36.7 ??C), resp. rate 19, height 5\' 5"  (1.651 m), weight 149.7 kg (330 lb), last menstrual period 08/14/2017, SpO2 99 %, unknown if currently breastfeeding.   Temp (24hrs), Avg:98.2 ??F (36.8 ??C), Min:98 ??F (36.7 ??C), Max:98.5 ??F (36.9 ??C)    Systolic (24hrs), Avg:135 , Min:121 , Max:144      Diastolic (24hrs), Avg:51, Min:41, Max:63       Intake and Output:     Date 10/15/17 0700 - 10/16/17 0659   Shift 0700-1459 1500-2259 2300-0659 24 Hour Total   INTAKE   P.O. 470   470   Shift Total(mL/kg) 470(3.1)   470(3.1)   OUTPUT   Shift Total(mL/kg)       Weight (kg) 149.7 149.7 149.7 149.7       Physical Exam:  Patient without distress.  Heart: Regular rate and rhythm, S1S2 present or without murmur or extra heart sounds  Lung: clear to auscultation throughout lung fields, no wheezes, no rales, no rhonchi and normal respiratory effort  Back: costovertebral angle tenderness present  Abdomen: soft, nontender  Fundus: soft and non tender  Perineum: blood absent, amniotic fluid absent  Lower Extremities:  - Edema No   - No evidence of DVT seen on physical exam.       Fetal Heart Rate:  Doppler not done yet     Exam Information     Status Exam Begun  Exam Ended    Final [99] 10/14/2017 16:37 10/14/2017 16:58   Result Information      Status: Edited Result - FINAL (Exam End: 10/14/2017 16:58) Provider Status: Open   Study Result     Indication: Spotting  ??  Transvaginal ultrasound demonstrates single intrauterine gestation in breech  presentation with anterior placenta and no previa. Fetal heart rate 153 bpm.  Amniotic fluid volume normal with single deepest pocket 4.2 cm. Cervical length  3.5 cm. Crown-rump length 65 mm. This corresponds to 12 weeks 6 days with [facility]  04/22/2018.  ??  IMPRESSION  IMPRESSION: Single live intrauterine gestation as described.  ??   Imaging     [place] PREG UTS < 14 WKS SNGL (Order: [id number]) - 10/14/2017     Labs:   Recent Results (from the past 36 hour(s))   RENAL FUNCTION PANEL    Collection Time: 10/14/17  4:36 AM   Result Value Ref Range    Sodium 135 (L) 136 - 145 mEq/L    Potassium 4.2 3.5 - 5.1 mEq/L    Chloride 104 98 - 107 mEq/L    CO2 24 21 - 32 mEq/L    Glucose 89 74 - 106 mg/dl    BUN 10 7 - 25 mg/dl  Creatinine 0.5 (L) 0.6 - 1.3 mg/dl    GFR est AA >16.1>60.0      GFR est non-AA >60      Calcium 9.1 8.5 - 10.1 mg/dl    Albumin 2.7 (L) 3.4 - 5.0 gm/dl    Phosphorus 4.8 2.5 - 4.9 mg/dl    Anion gap 6 5 - 15 mmol/L   RENAL FUNCTION PANEL    Collection Time: 10/15/17  4:45 AM   Result Value Ref Range    Sodium 136 136 - 145 mEq/L    Potassium 4.3 3.5 - 5.1 mEq/L    Chloride 104 98 - 107 mEq/L    CO2 24 21 - 32 mEq/L    Glucose 89 74 - 106 mg/dl    BUN 7 7 - 25 mg/dl    Creatinine 0.5 (L) 0.6 - 1.3 mg/dl    GFR est AA >09.6>60.0      GFR est non-AA >60      Calcium 9.2 8.5 - 10.1 mg/dl    Albumin 2.8 (L) 3.4 - 5.0 gm/dl    Phosphorus 4.7 2.5 - 4.9 mg/dl    Anion gap 8 5 - 15 mmol/L       Assessment and Plan:      Active Problems:    Back pain (10/09/2017)      Intractable vomiting with nausea (10/09/2017)     24 hours urine for proteins pending in the process of collecting   Will increase her labetalol to 100 mg po bid   Continue iv antibiotic till able to change to oral   consider home health care .   Will add flexeril for muscular pain     Signed By: Leonette NuttingSeham N Sha Burling, MD     October 15, 2017

## 2017-10-15 NOTE — Progress Notes (Signed)
Problem: Falls - Risk of  Goal: *Absence of Falls  Description  Document Schmid Fall Risk and appropriate interventions in the flowsheet.  Outcome: Progressing Towards Goal  Note:   Fall Risk Interventions:            Medication Interventions: Patient to call before getting OOB, Teach patient to arise slowly                   Problem: Patient Education: Go to Patient Education Activity  Goal: Patient/Family Education  Outcome: Progressing Towards Goal     Problem: Pain  Goal: *Control of Pain  Outcome: Progressing Towards Goal     Problem: Patient Education: Go to Patient Education Activity  Goal: Patient/Family Education  Outcome: Progressing Towards Goal

## 2017-10-15 NOTE — Progress Notes (Signed)
Medical Progress Note      NAME: Linda [name]   DOB:  1990-07-08  MRM:  [id number]    Date/Time: 10/15/2017  12:26 PM         Subjective:     27 year old woman with IUP admitted with pyelonephritis. Pt feeling better today    Past Medical History reviewed and unchanged from Admission History and Physical    Review of Systems   Constitutional: Positive for fatigue.   HENT: Negative.    Eyes: Negative.    Respiratory: Negative.    Cardiovascular: Negative.    Gastrointestinal: Negative for nausea.   Endocrine: Negative.    Genitourinary: Negative for dysuria and flank pain.   Musculoskeletal: Negative for back pain.   Neurological: Negative.    Hematological: Negative.    Psychiatric/Behavioral: Negative.             Objective:       Vitals:      Last 24hrs VS reviewed since prior progress note. Most recent are:    Visit Vitals  BP 151/58 (BP 1 Location: Left arm, BP Patient Position: Supine)   Pulse 95   Temp 98.3 ??F (36.8 ??C)   Resp 19   Ht 5\' 5"  (1.651 m)   Wt 149.7 kg (330 lb)   SpO2 100%   BMI 54.91 kg/m??     SpO2 Readings from Last 6 Encounters:   10/15/17 100%   09/25/17 100%   09/14/17 99%   08/05/17 99%   12/11/15 100%   08/02/15 99%            Intake/Output Summary (Last 24 hours) at 10/15/2017 1550  Last data filed at 10/15/2017 0945  Gross per 24 hour   Intake 3185 ml   Output 2700 ml   Net 485 ml          Exam:      Physical Exam   Nursing note and vitals reviewed.  Constitutional: She is oriented to person, place, and time. She appears well-developed and well-nourished.   HENT:   Head: Normocephalic and atraumatic.   Eyes: EOM are normal. Pupils are equal, round, and reactive to light.   Neck: Normal range of motion. Neck supple.   Cardiovascular: Normal rate and regular rhythm.   Pulmonary/Chest: Effort normal and breath sounds normal.   Abdominal: Soft. Bowel sounds are normal.   Musculoskeletal: Normal range of motion. She exhibits edema.    Neurological: She is alert and oriented to person, place, and time.   Skin: Skin is warm and dry.       Lab Data Reviewed: (see below)      Medications:  Current Facility-Administered Medications   Medication Dose Route Frequency   ??? bisacodyl (DULCOLAX) suppository 10 mg  10 mg Rectal DAILY PRN   ??? bisacodyl (DULCOLAX) tablet 5 mg  5 mg Oral DAILY PRN   ??? magnesium hydroxide (MILK OF MAGNESIA) 400 mg/5 mL oral suspension 30 mL  30 mL Oral DAILY PRN   ??? polyethylene glycol (MIRALAX) packet 17 g  17 g Oral DAILY   ??? labetalol (NORMODYNE) tablet 100 mg  100 mg Oral Q12H   ??? cyclobenzaprine (FLEXERIL) tablet 5 mg  5 mg Oral QHS   ??? promethazine (PHENERGAN) 25 mg in NS IVPB  25 mg IntraVENous Q8H   ??? 0.9% sodium chloride with KCl 40 mEq/L infusion   IntraVENous CONTINUOUS   ??? levothyroxine (SYNTHROID) tablet 75 mcg  75 mcg Oral Q24H   ???  sodium chloride (NS) flush 5-10 mL  5-10 mL IntraVENous Q8H   ??? sodium chloride (NS) flush 5-10 mL  5-10 mL IntraVENous PRN   ??? naloxone (NARCAN) injection 0.1 mg  0.1 mg IntraVENous PRN   ??? ondansetron (ZOFRAN) injection 4 mg  4 mg IntraVENous Q6H PRN   ??? HYDROcodone-acetaminophen (NORCO) 5-325 mg per tablet 1 Tab  1 Tab Oral Q6H PRN   ??? famotidine (PF) (PEPCID) 20 mg in sodium chloride 0.9% 10 mL injection  20 mg IntraVENous Q12H       ______________________________________________________________________      Lab Review:     No results for input(s): WBC, HGB, HGBEXT, HCT, HCTEXT, PLT, PLTEXT, HGBEXT, HCTEXT, PLTEXT, HGBEXT, HCTEXT, PLTEXT in the last 72 hours.  Recent Labs     10/15/17  0445 10/14/17  0436 10/13/17  0400   NA 136 135* 135*   K 4.3 4.2 4.3   CL 104 104 103   CO2 24 24 23    GLU 89 89 80   BUN 7 10 4*   CREA 0.5* 0.5* 0.4*   CA 9.2 9.1 8.9   PHOS 4.7 4.8 4.5   ALB 2.8* 2.7* 2.7*          Assessment:     Active Problems:    Back pain (10/09/2017)      Intractable vomiting with nausea (10/09/2017)           Plan:     Risk of deterioration: medium              1. Pyelonephritis. Continue antibiotics and PRN pain meds  2. Dehydration due to N/V improved. DC IV fluids--antiemetics per OB/GYN  3. Hypokalemia. improved  4. IUP per OB GYN  5. 24 hour urine for protein and creatinine per Ob GYN in progress.   6. Ambulation encouraged    Antibiotics for UTI completed. Repeat UA in AM  CO constipation. Cathartics PRN ordered.          Total time spent with patient: 30 Minutes                  Care Plan discussed with: Patient, Care Manager, Nursing Staff and >50% of time spent in counseling and coordination of care    Discussed:  Care Plan    Prophylaxis:  SCD's    Disposition:  Home w/Family           ___________________________________________________    Attending Physician: Marlene BastJeffrey G Harmon Bommarito, MD

## 2017-10-15 NOTE — Progress Notes (Signed)
Bedside and Verbal shift change report given to Shaniqua Murphy   (oncoming nurse) by Amy Runge RN (offgoing nurse). Report included the following information SBAR, Kardex and MAR.

## 2017-10-15 NOTE — Other (Signed)
Bedside and Verbal shift change report given to Nanette (oncoming nurse) by Shaniqua (offgoing nurse). Report included the following information SBAR and Kardex.

## 2017-10-15 NOTE — Progress Notes (Signed)
Problem: Falls - Risk of  Goal: *Absence of Falls  Description  Document Schmid Fall Risk and appropriate interventions in the flowsheet.  Outcome: Progressing Towards Goal  Note:   Fall Risk Interventions:     Medication Interventions: Patient to call before getting OOB, Teach patient to arise slowly       Problem: Patient Education: Go to Patient Education Activity  Goal: Patient/Family Education  Outcome: Progressing Towards Goal

## 2017-10-15 NOTE — Progress Notes (Signed)
Orders received to d/c IV fluids and change phenergan IV to Q8hrs PRN. Orders carried out. Pt notified with expressed understanding.

## 2017-10-15 NOTE — Progress Notes (Signed)
Problem: Falls - Risk of  Goal: *Absence of Falls  Description  Document Linda [name] Fall Risk and appropriate interventions in the flowsheet.  Outcome: Progressing Towards Goal  Note:   Fall Risk Interventions:     Medication Interventions: Patient to call before getting OOB, Teach patient to arise slowly       Problem: Patient Education: Go to Patient Education Activity  Goal: Patient/Family Education  Outcome: Progressing Towards Goal

## 2017-10-15 NOTE — Progress Notes (Signed)
1246-  PAGER ID: [phone]   MESSAGE: Pt in rm 4213 Linda [name] is complaining of constipation no bm in 2 days. Thank you. Kennyth LoseShaniqua Eulah PontMurphy [phone]

## 2017-10-15 NOTE — Progress Notes (Signed)
Called Lab about new U/A order. Lab stated it would be added from the urine sent at 1500.

## 2017-10-15 NOTE — Progress Notes (Signed)
Medical Progress Note      NAME: Linda [name]   DOB:  Dec 23, 1990  MRM:  [id number]    Date/Time: 10/15/2017  12:26 PM         Subjective:     27 year old woman with IUP admitted with pyelonephritis. Pt feeling better today    Past Medical History reviewed and unchanged from Admission History and Physical    Review of Systems   Constitutional: Positive for fatigue.   HENT: Negative.    Eyes: Negative.    Respiratory: Negative.    Cardiovascular: Negative.    Gastrointestinal: Negative for nausea.   Endocrine: Negative.    Genitourinary: Negative for dysuria and flank pain.   Musculoskeletal: Negative for back pain.   Neurological: Negative.    Hematological: Negative.    Psychiatric/Behavioral: Negative.             Objective:       Vitals:      Last 24hrs VS reviewed since prior progress note. Most recent are:    Visit Vitals  BP 151/58 (BP 1 Location: Left arm, BP Patient Position: Supine)   Pulse 95   Temp 98.3 ??F (36.8 ??C)   Resp 19   Ht 5\' 5"  (1.651 m)   Wt 149.7 kg (330 lb)   SpO2 100%   BMI 54.91 kg/m??     SpO2 Readings from Last 6 Encounters:   10/15/17 100%   09/25/17 100%   09/14/17 99%   08/05/17 99%   12/11/15 100%   08/02/15 99%            Intake/Output Summary (Last 24 hours) at 10/15/2017 1550  Last data filed at 10/15/2017 0945  Gross per 24 hour   Intake 3185 ml   Output 2700 ml   Net 485 ml          Exam:      Physical Exam   Nursing note and vitals reviewed.  Constitutional: She is oriented to person, place, and time. She appears well-developed and well-nourished.   HENT:   Head: Normocephalic and atraumatic.   Eyes: EOM are normal. Pupils are equal, round, and reactive to light.   Neck: Normal range of motion. Neck supple.   Cardiovascular: Normal rate and regular rhythm.   Pulmonary/Chest: Effort normal and breath sounds normal.   Abdominal: Soft. Bowel sounds are normal.   Musculoskeletal: Normal range of motion. She exhibits edema.   Neurological: She is alert and oriented to person, place, and  time.   Skin: Skin is warm and dry.       Lab Data Reviewed: (see below)      Medications:  Current Facility-Administered Medications   Medication Dose Route Frequency   ??? bisacodyl (DULCOLAX) suppository 10 mg  10 mg Rectal DAILY PRN   ??? bisacodyl (DULCOLAX) tablet 5 mg  5 mg Oral DAILY PRN   ??? magnesium hydroxide (MILK OF MAGNESIA) 400 mg/5 mL oral suspension 30 mL  30 mL Oral DAILY PRN   ??? polyethylene glycol (MIRALAX) packet 17 g  17 g Oral DAILY   ??? labetalol (NORMODYNE) tablet 100 mg  100 mg Oral Q12H   ??? cyclobenzaprine (FLEXERIL) tablet 5 mg  5 mg Oral QHS   ??? promethazine (PHENERGAN) 25 mg in NS IVPB  25 mg IntraVENous Q8H   ??? 0.9% sodium chloride with KCl 40 mEq/L infusion   IntraVENous CONTINUOUS   ??? levothyroxine (SYNTHROID) tablet 75 mcg  75 mcg Oral Q24H   ???  sodium chloride (NS) flush 5-10 mL  5-10 mL IntraVENous Q8H   ??? sodium chloride (NS) flush 5-10 mL  5-10 mL IntraVENous PRN   ??? naloxone (NARCAN) injection 0.1 mg  0.1 mg IntraVENous PRN   ??? ondansetron (ZOFRAN) injection 4 mg  4 mg IntraVENous Q6H PRN   ??? HYDROcodone-acetaminophen (NORCO) 5-325 mg per tablet 1 Tab  1 Tab Oral Q6H PRN   ??? famotidine (PF) (PEPCID) 20 mg in sodium chloride 0.9% 10 mL injection  20 mg IntraVENous Q12H       ______________________________________________________________________      Lab Review:     No results for input(s): WBC, HGB, HGBEXT, HCT, HCTEXT, PLT, PLTEXT, HGBEXT, HCTEXT, PLTEXT, HGBEXT, HCTEXT, PLTEXT in the last 72 hours.  Recent Labs     10/15/17  0445 10/14/17  0436 10/13/17  0400   NA 136 135* 135*   K 4.3 4.2 4.3   CL 104 104 103   CO2 24 24 23    GLU 89 89 80   BUN 7 10 4*   CREA 0.5* 0.5* 0.4*   CA 9.2 9.1 8.9   PHOS 4.7 4.8 4.5   ALB 2.8* 2.7* 2.7*          Assessment:     Active Problems:    Back pain (10/09/2017)      Intractable vomiting with nausea (10/09/2017)           Plan:     Risk of deterioration: medium             1. Pyelonephritis. Continue antibiotics and PRN pain meds  2. Dehydration due  to N/V improved. DC IV fluids--antiemetics per OB/GYN  3. Hypokalemia. improved  4. IUP per OB GYN  5. 24 hour urine for protein and creatinine per Ob GYN in progress.   6. Ambulation encouraged    Antibiotics for UTI completed. Repeat UA in AM  CO constipation. Cathartics PRN ordered.          Total time spent with patient: 30 Minutes                  Care Plan discussed with: Patient, Care Manager, Nursing Staff and >50% of time spent in counseling and coordination of care    Discussed:  Care Plan    Prophylaxis:  SCD's    Disposition:  Home w/Family           ___________________________________________________    Attending Physician: Marlene BastJeffrey G Torre [place]umb[place], MD

## 2017-10-15 NOTE — Progress Notes (Signed)
Problem: Falls - Risk of  Goal: *Absence of Falls  Description  Document Bridgette HabermannSchmid Fall Risk and appropriate interventions in the flowsheet.  10/15/2017 0325 by Toma Aranunge, Amy L  Outcome: Progressing Towards Goal  Note:   Fall Risk Interventions:            Medication Interventions: Patient to call before getting OOB, Teach patient to arise slowly                10/15/2017 0325 by Wynetta Finesunge, Amy L  Outcome: Progressing Towards Goal  Note:   Fall Risk Interventions:            Medication Interventions: Patient to call before getting OOB, Teach patient to arise slowly                   Problem: Patient Education: Go to Patient Education Activity  Goal: Patient/Family Education  10/15/2017 0325 by Wynetta Finesunge, Amy L  Outcome: Progressing Towards Goal  10/15/2017 0325 by Wynetta Finesunge, Amy L  Outcome: Progressing Towards Goal     Problem: Pain  Goal: *Control of Pain  10/15/2017 0325 by Wynetta Finesunge, Amy L  Outcome: Progressing Towards Goal  10/15/2017 0325 by Wynetta Finesunge, Amy L  Outcome: Progressing Towards Goal     Problem: Patient Education: Go to Patient Education Activity  Goal: Patient/Family Education  10/15/2017 0325 by Wynetta Finesunge, Amy L  Outcome: Progressing Towards Goal  10/15/2017 0325 by Wynetta Finesunge, Amy L  Outcome: Progressing Towards Goal

## 2017-10-15 NOTE — Progress Notes (Signed)
Obstetrics Progress Note    Name: Linda [name] MRN: [id number]  SSN: [id number]    Date of Birth: [birth year]  Age: 27 y.o.  Sex: female      Subjective:      LOS: 6 days    Estimated Date of Delivery:  04/21/2018  Gestational Age Today: 13 1/7 weeks     Patient seen today has no fever or chills ,she had bm yesterday ,her pain is less than before but still there she feels when her  promethazine wear off ,she feels sleepy  ,tired ,she kept her breakfast and lunch ,dinner yesterday ,vomited once middle of night after she ate cereal with milk . Kept her breakfast toady .     Objective:     Vitals:  Blood pressure 142/42, pulse 94, temperature 98.1 ??F (36.7 ??C), resp. rate 19, height 5\' 5"  (1.651 m), weight 149.7 kg (330 lb), last menstrual period 08/14/2017, SpO2 99 %, unknown if currently breastfeeding.   Temp (24hrs), Avg:98.2 ??F (36.8 ??C), Min:98 ??F (36.7 ??C), Max:98.5 ??F (36.9 ??C)    Systolic (24hrs), Avg:135 , Min:121 , Max:144      Diastolic (24hrs), Avg:51, Min:41, Max:63       Intake and Output:     Date 10/15/17 0700 - 10/16/17 0659   Shift 0700-1459 1500-2259 2300-0659 24 Hour Total   INTAKE   P.O. 470   470   Shift Total(mL/kg) 470(3.1)   470(3.1)   OUTPUT   Shift Total(mL/kg)       Weight (kg) 149.7 149.7 149.7 149.7       Physical Exam:  Patient without distress.  Heart: Regular rate and rhythm, S1S2 present or without murmur or extra heart sounds  Lung: clear to auscultation throughout lung fields, no wheezes, no rales, no rhonchi and normal respiratory effort  Back: costovertebral angle tenderness present  Abdomen: soft, nontender  Fundus: soft and non tender  Perineum: blood absent, amniotic fluid absent  Lower Extremities:  - Edema No   - No evidence of DVT seen on physical exam.       Fetal Heart Rate:  Doppler not done yet     Exam Information     Status Exam Begun  Exam Ended    Final [99] 10/14/2017 16:37 10/14/2017 16:58   Result Information     Status: Edited Result - FINAL (Exam End: 10/14/2017  16:58) Provider Status: Open   Study Result     Indication: Spotting  ??  Transvaginal ultrasound demonstrates single intrauterine gestation in breech  presentation with anterior placenta and no previa. Fetal heart rate 153 bpm.  Amniotic fluid volume normal with single deepest pocket 4.2 cm. Cervical length  3.5 cm. Crown-rump length 65 mm. This corresponds to 12 weeks 6 days with [facility]  04/22/2018.  ??  IMPRESSION  IMPRESSION: Single live intrauterine gestation as described.  ??   Imaging     [place] PREG UTS < 14 WKS SNGL (Order: [id number]) - 10/14/2017     Labs:   Recent Results (from the past 36 hour(s))   RENAL FUNCTION PANEL    Collection Time: 10/14/17  4:36 AM   Result Value Ref Range    Sodium 135 (L) 136 - 145 mEq/L    Potassium 4.2 3.5 - 5.1 mEq/L    Chloride 104 98 - 107 mEq/L    CO2 24 21 - 32 mEq/L    Glucose 89 74 - 106 mg/dl    BUN 10 7 - 25 mg/dl  Creatinine 0.5 (L) 0.6 - 1.3 mg/dl    GFR est AA >16.1>60.0      GFR est non-AA >60      Calcium 9.1 8.5 - 10.1 mg/dl    Albumin 2.7 (L) 3.4 - 5.0 gm/dl    Phosphorus 4.8 2.5 - 4.9 mg/dl    Anion gap 6 5 - 15 mmol/L   RENAL FUNCTION PANEL    Collection Time: 10/15/17  4:45 AM   Result Value Ref Range    Sodium 136 136 - 145 mEq/L    Potassium 4.3 3.5 - 5.1 mEq/L    Chloride 104 98 - 107 mEq/L    CO2 24 21 - 32 mEq/L    Glucose 89 74 - 106 mg/dl    BUN 7 7 - 25 mg/dl    Creatinine 0.5 (L) 0.6 - 1.3 mg/dl    GFR est AA >09.6>60.0      GFR est non-AA >60      Calcium 9.2 8.5 - 10.1 mg/dl    Albumin 2.8 (L) 3.4 - 5.0 gm/dl    Phosphorus 4.7 2.5 - 4.9 mg/dl    Anion gap 8 5 - 15 mmol/L       Assessment and Plan:      Active Problems:    Back pain (10/09/2017)      Intractable vomiting with nausea (10/09/2017)     24 hours urine for proteins pending in the process of collecting   Will increase her labetalol to 100 mg po bid   Continue iv antibiotic till able to change to oral   consider home health care .  Will add flexeril for muscular pain     Signed By: Leonette NuttingSeham N Rashae Rother, MD     October 15, 2017

## 2017-10-16 LAB — CULTURE, BLOOD
Blood Culture Result: NO GROWTH
Blood Culture Result: NO GROWTH

## 2017-10-16 LAB — PROTEIN, URINE, 24 HR
PROTEIN TOTAL, URINE: 6.6 mg/dl
PROTEIN, URINE 24 HR, 013219: 203 mg/24 Hr — ABNORMAL HIGH (ref 10–150)
Protein total, urine: 6.6 mg/dl
Protein urine, 24 hr calc.: 203 mg/24 Hr — ABNORMAL HIGH (ref 10–150)
Total Volume: 3075 ml — ABNORMAL HIGH (ref 400–2500)
Total volume: 3075 ml — ABNORMAL HIGH (ref 400–2500)

## 2017-10-16 LAB — RENAL FUNCTION PANEL
Albumin: 3 gm/dl — ABNORMAL LOW (ref 3.4–5.0)
Albumin: 3 gm/dl — ABNORMAL LOW (ref 3.4–5.0)
Anion Gap: 10 mmol/L (ref 5–15)
Anion gap: 10 mmol/L (ref 5–15)
BUN: 6 mg/dl — ABNORMAL LOW (ref 7–25)
BUN: 6 mg/dl — ABNORMAL LOW (ref 7–25)
CO2: 23 mEq/L (ref 21–32)
CO2: 23 mEq/L (ref 21–32)
Calcium: 9.5 mg/dl (ref 8.5–10.1)
Calcium: 9.5 mg/dl (ref 8.5–10.1)
Chloride: 101 mEq/L (ref 98–107)
Chloride: 101 mEq/L (ref 98–107)
Creatinine: 0.6 mg/dl (ref 0.6–1.3)
Creatinine: 0.6 mg/dl (ref 0.6–1.3)
EGFR IF NonAfrican American: >60
GFR African American: >60
GFR est AA: >60
GFR est non-AA: >60
Glucose: 95 mg/dl (ref 74–106)
Glucose: 95 mg/dl (ref 74–106)
Phosphorus: 4.7 mg/dl (ref 2.5–4.9)
Phosphorus: 4.7 mg/dl (ref 2.5–4.9)
Potassium: 3.9 mEq/L (ref 3.5–5.1)
Potassium: 3.9 mEq/L (ref 3.5–5.1)
Sodium: 135 mEq/L — ABNORMAL LOW (ref 136–145)
Sodium: 135 mEq/L — ABNORMAL LOW (ref 136–145)

## 2017-10-16 LAB — CREATININE CLEARANCE
CREATININE CLEARANCE, 013029: 18 mL/min — ABNORMAL LOW (ref 88–128)
Creatinine Clearance: 18 mL/min — ABNORMAL LOW (ref 88–128)
Creatinine, Ur: 6.6 mg/dl
Creatinine, urine random: 6.6 mg/dl
Creatinine: 0.5 mg/dl — ABNORMAL LOW (ref 0.6–1.3)
Creatinine: 0.5 mg/dl — ABNORMAL LOW (ref 0.6–1.3)
Number of hours: 24 h
Number of hours: 24 h
Patient Height (In): 65 [in_us]
Patient Weight (lbs): 339 [lb_av]
Patient height: 65 [in_us]
Patient weight: 339 [lb_av]
Total Volume, Urine: 3075 ml
Total volume, urine: 3075 ml

## 2017-10-16 LAB — CULTURE, BLOOD 1
BLOOD CULTURE RESULT: NO GROWTH
BLOOD CULTURE RESULT: NO GROWTH

## 2017-10-16 MED ORDER — PROMETHAZINE 25 MG TAB
25 mg | Freq: Two times a day (BID) | ORAL | Status: DC
Start: 2017-10-16 — End: 2017-10-17
  Administered 2017-10-16 – 2017-10-17 (×2): via ORAL

## 2017-10-16 MED ORDER — PROMETHAZINE IN NS 25 MG/50 ML IV PIGGY BAG
25 mg/50 ml | Freq: Once | INTRAVENOUS | Status: AC
Start: 2017-10-16 — End: 2017-10-16
  Administered 2017-10-17: 02:00:00 via INTRAVENOUS

## 2017-10-16 MED ORDER — LABETALOL 100 MG TAB
100 mg | Freq: Two times a day (BID) | ORAL | Status: DC
Start: 2017-10-16 — End: 2017-10-17
  Administered 2017-10-17 (×2): via ORAL

## 2017-10-16 MED FILL — BD POSIFLUSH NORMAL SALINE 0.9 % INJECTION SYRINGE: INTRAMUSCULAR | Qty: 10

## 2017-10-16 MED FILL — LABETALOL 100 MG TAB: 100 mg | ORAL | Qty: 1

## 2017-10-16 MED FILL — LEVOTHYROXINE 25 MCG TAB: 25 mcg | ORAL | Qty: 1

## 2017-10-16 MED FILL — PROMETHAZINE IN NS 25 MG/50 ML IV PIGGY BAG: 25 mg/50 ml | INTRAVENOUS | Qty: 50

## 2017-10-16 MED FILL — FAMOTIDINE (PF) 20 MG/2 ML IV: 20 mg/2 mL | INTRAVENOUS | Qty: 2

## 2017-10-16 MED FILL — PROMETHAZINE 25 MG TAB: 25 mg | ORAL | Qty: 1

## 2017-10-16 MED FILL — HYDROCODONE-ACETAMINOPHEN 5 MG-325 MG TAB: 5-325 mg | ORAL | Qty: 1

## 2017-10-16 MED FILL — POLYETHYLENE GLYCOL 3350 17 GRAM (100 %) ORAL POWDER PACKET: 17 gram | ORAL | Qty: 1

## 2017-10-16 MED FILL — CYCLOBENZAPRINE 10 MG TAB: 10 mg | ORAL | Qty: 1

## 2017-10-16 NOTE — Progress Notes (Signed)
Problem: Falls - Risk of  Goal: *Absence of Falls  Description  Document Schmid Fall Risk and appropriate interventions in the flowsheet.  Outcome: Progressing Towards Goal  Note:   Fall Risk Interventions:            Medication Interventions: Patient to call before getting OOB, Evaluate medications/consider consulting pharmacy                   Problem: Pain  Goal: *Control of Pain  Outcome: Progressing Towards Goal

## 2017-10-16 NOTE — Other (Signed)
Bedside and Verbal shift change report given to Jolanda SHort RN (oncoming nurse) by Nannette RN (offgoing nurse). Report included the following information SBAR.
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
## 2017-10-16 NOTE — Other (Signed)
Bedside and Verbal shift change report given to Nanette (Cabin crewoncoming nurse) by Angelena SoleJolanda (offgoing nurse). Report included the following information SBAR and Kardex.

## 2017-10-16 NOTE — Progress Notes (Signed)
Patient refused to have am labs drawn

## 2017-10-16 NOTE — Progress Notes (Signed)
Obstetrics Progress Note    Name: Linda [name] MRN: [id number]  SSN: [id number]    Date of Birth: 11/17/1990  Age: 27 y.o.  Sex: female      Subjective:      LOS: 7 days    Estimated Date of Delivery: 04/21/2018   Gestational Age Today: 13 2/7 weeks     Patient seen feels better ,she vomited yesterday her lunch ,but able to keep breakfast today ,no fever or chills  ,said her muscle relaxant did not do anything ,but pain is less ,she took phenergan iv one time only yesterday ,she wants to see if she can go without ,but she vomited her dinner .    Objective:     Vitals:  Blood pressure 149/64, pulse 94, temperature 98.3 ??F (36.8 ??C), resp. rate 20, height 5\' 5"  (1.651 m), weight 154.1 kg (339 lb 12.8 oz), last menstrual period 08/14/2017, SpO2 96 %, unknown if currently breastfeeding.   Temp (24hrs), Avg:98.3 ??F (36.8 ??C), Min:98.1 ??F (36.7 ??C), Max:98.6 ??F (37 ??C)    Systolic (24hrs), Avg:129 , Min:103 , Max:151      Diastolic (24hrs), Avg:50, Min:41, Max:64       Intake and Output:         Physical Exam:  Patient without distress.  Heart: Regular rate and rhythm, S1S2 present or without murmur or extra heart sounds  Lung: clear to auscultation throughout lung fields, no wheezes, no rales, no rhonchi and normal respiratory effort  Back: costovertebral angle tenderness present ,mild right side .  Abdomen: soft, non tender ,morbid obesity   Fundus: soft and non tender  Perineum: blood absent, amniotic fluid absent  Lower Extremities:  - Edema No   - No evidence of DVT seen on physical exam.       Fetal Heart Rate:  Not done today        Labs:   Recent Results (from the past 36 hour(s))   RENAL FUNCTION PANEL    Collection Time: 10/15/17  4:45 AM   Result Value Ref Range    Sodium 136 136 - 145 mEq/L    Potassium 4.3 3.5 - 5.1 mEq/L    Chloride 104 98 - 107 mEq/L    CO2 24 21 - 32 mEq/L    Glucose 89 74 - 106 mg/dl    BUN 7 7 - 25 mg/dl    Creatinine 0.5 (L) 0.6 - 1.3 mg/dl    GFR est AA >91.4       GFR est non-AA >60      Calcium 9.2 8.5 - 10.1 mg/dl    Albumin 2.8 (L) 3.4 - 5.0 gm/dl    Phosphorus 4.7 2.5 - 4.9 mg/dl    Anion gap 8 5 - 15 mmol/L   URINALYSIS W/ RFLX MICROSCOPIC    Collection Time: 10/15/17  2:50 PM   Result Value Ref Range    Color YELLOW YELLOW,STRAW      Appearance CLEAR CLEAR      Glucose NEGATIVE NEGATIVE,Negative mg/dl    Bilirubin NEGATIVE NEGATIVE,Negative      Ketone NEGATIVE NEGATIVE,Negative mg/dl    Specific gravity 7.829 1.005 - 1.030      Blood NEGATIVE NEGATIVE,Negative      pH (UA) 7.0 5.0 - 9.0      Protein NEGATIVE NEGATIVE,Negative mg/dl    Urobilinogen 0.2 0.0 - 1.0 mg/dl    Nitrites NEGATIVE NEGATIVE,Negative      Leukocyte Esterase NEGATIVE NEGATIVE,Negative  RENAL FUNCTION PANEL    Collection Time: 10/16/17  9:40 AM   Result Value Ref Range    Sodium 135 (L) 136 - 145 mEq/L    Potassium 3.9 3.5 - 5.1 mEq/L    Chloride 101 98 - 107 mEq/L    CO2 23 21 - 32 mEq/L    Glucose 95 74 - 106 mg/dl    BUN 6 (L) 7 - 25 mg/dl    Creatinine 0.6 0.6 - 1.3 mg/dl    GFR est AA >37.6>60.0      GFR est non-AA >60      Calcium 9.5 8.5 - 10.1 mg/dl    Albumin 3.0 (L) 3.4 - 5.0 gm/dl    Phosphorus 4.7 2.5 - 4.9 mg/dl    Anion gap 10 5 - 15 mmol/L       Assessment and Plan:      Active Problems:    Back pain (10/09/2017)      Intractable vomiting with nausea (10/09/2017)     pyelonephritis with pregnancy improving   Will chang phenergan to oral ,and give one dose iv tonight   If no vomiting today ,she can be discharged on oral phenergan tomorrow ,to fu in office     Signed By: Leonette NuttingSeham N Yasenia Reedy, MD     October 16, 2017

## 2017-10-16 NOTE — Progress Notes (Signed)
Obstetrics Progress Note    Name: Linda [name] MRN: [id number]  SSN: [id number]    Date of Birth: [birth year]  Age: 27 y.o.  Sex: female      Subjective:      LOS: 7 days    Estimated Date of Delivery: 04/21/2018   Gestational Age Today: 13 2/7 weeks     Patient seen feels better ,she vomited yesterday her lunch ,but able to keep breakfast today ,no fever or chills  ,said her muscle relaxant did not do anything ,but pain is less ,she took phenergan iv one time only yesterday ,she wants to see if she can go without ,but she vomited her dinner .    Objective:     Vitals:  Blood pressure 149/64, pulse 94, temperature 98.3 ??F (36.8 ??C), resp. rate 20, height 5\' 5"  (1.651 m), weight 154.1 kg (339 lb 12.8 oz), last menstrual period 08/14/2017, SpO2 96 %, unknown if currently breastfeeding.   Temp (24hrs), Avg:98.3 ??F (36.8 ??C), Min:98.1 ??F (36.7 ??C), Max:98.6 ??F (37 ??C)    Systolic (24hrs), Avg:129 , Min:103 , Max:151      Diastolic (24hrs), Avg:50, Min:41, Max:64       Intake and Output:         Physical Exam:  Patient without distress.  Heart: Regular rate and rhythm, S1S2 present or without murmur or extra heart sounds  Lung: clear to auscultation throughout lung fields, no wheezes, no rales, no rhonchi and normal respiratory effort  Back: costovertebral angle tenderness present ,mild right side .  Abdomen: soft, non tender ,morbid obesity   Fundus: soft and non tender  Perineum: blood absent, amniotic fluid absent  Lower Extremities:  - Edema No   - No evidence of DVT seen on physical exam.       Fetal Heart Rate:  Not done today        Labs:   Recent Results (from the past 36 hour(s))   RENAL FUNCTION PANEL    Collection Time: 10/15/17  4:45 AM   Result Value Ref Range    Sodium 136 136 - 145 mEq/L    Potassium 4.3 3.5 - 5.1 mEq/L    Chloride 104 98 - 107 mEq/L    CO2 24 21 - 32 mEq/L    Glucose 89 74 - 106 mg/dl    BUN 7 7 - 25 mg/dl    Creatinine 0.5 (L) 0.6 - 1.3 mg/dl    GFR est AA >91.4      GFR est non-AA >60       Calcium 9.2 8.5 - 10.1 mg/dl    Albumin 2.8 (L) 3.4 - 5.0 gm/dl    Phosphorus 4.7 2.5 - 4.9 mg/dl    Anion gap 8 5 - 15 mmol/L   URINALYSIS W/ RFLX MICROSCOPIC    Collection Time: 10/15/17  2:50 PM   Result Value Ref Range    Color YELLOW YELLOW,STRAW      Appearance CLEAR CLEAR      Glucose NEGATIVE NEGATIVE,Negative mg/dl    Bilirubin NEGATIVE NEGATIVE,Negative      Ketone NEGATIVE NEGATIVE,Negative mg/dl    Specific gravity 7.829 1.005 - 1.030      Blood NEGATIVE NEGATIVE,Negative      pH (UA) 7.0 5.0 - 9.0      Protein NEGATIVE NEGATIVE,Negative mg/dl    Urobilinogen 0.2 0.0 - 1.0 mg/dl    Nitrites NEGATIVE NEGATIVE,Negative      Leukocyte Esterase NEGATIVE NEGATIVE,Negative  RENAL FUNCTION PANEL    Collection Time: 10/16/17  9:40 AM   Result Value Ref Range    Sodium 135 (L) 136 - 145 mEq/L    Potassium 3.9 3.5 - 5.1 mEq/L    Chloride 101 98 - 107 mEq/L    CO2 23 21 - 32 mEq/L    Glucose 95 74 - 106 mg/dl    BUN 6 (L) 7 - 25 mg/dl    Creatinine 0.6 0.6 - 1.3 mg/dl    GFR est AA >72.5>60.0      GFR est non-AA >60      Calcium 9.5 8.5 - 10.1 mg/dl    Albumin 3.0 (L) 3.4 - 5.0 gm/dl    Phosphorus 4.7 2.5 - 4.9 mg/dl    Anion gap 10 5 - 15 mmol/L       Assessment and Plan:      Active Problems:    Back pain (10/09/2017)      Intractable vomiting with nausea (10/09/2017)     pyelonephritis with pregnancy improving   Will chang phenergan to oral ,and give one dose iv tonight   If no vomiting today ,she can be discharged on oral phenergan tomorrow ,to fu in office     Signed By: Leonette NuttingSeham N Batu Cassin, MD     October 16, 2017

## 2017-10-16 NOTE — Progress Notes (Signed)
Problem: Falls - Risk of  Goal: *Absence of Falls  Description  Document Linda [name] Fall Risk and appropriate interventions in the flowsheet.  Outcome: Progressing Towards Goal  Note:   Fall Risk Interventions:  Medication Interventions: Patient to call before getting OOB, Evaluate medications/consider consulting pharmacy       Problem: Pain  Goal: *Control of Pain  Outcome: Progressing Towards Goal
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
## 2017-10-17 MED ORDER — LABETALOL 200 MG TAB
200 mg | ORAL_TABLET | Freq: Two times a day (BID) | ORAL | 0 refills | Status: DC
Start: 2017-10-17 — End: 2018-01-09

## 2017-10-17 MED ORDER — PROMETHAZINE 25 MG TAB
25 mg | ORAL_TABLET | Freq: Three times a day (TID) | ORAL | 0 refills | Status: DC | PRN
Start: 2017-10-17 — End: 2018-01-09

## 2017-10-17 MED FILL — LABETALOL 100 MG TAB: 100 mg | ORAL | Qty: 2

## 2017-10-17 MED FILL — CYCLOBENZAPRINE 10 MG TAB: 10 mg | ORAL | Qty: 1

## 2017-10-17 MED FILL — FAMOTIDINE (PF) 20 MG/2 ML IV: 20 mg/2 mL | INTRAVENOUS | Qty: 2

## 2017-10-17 MED FILL — PROMETHAZINE IN NS 25 MG/50 ML IV PIGGY BAG: 25 mg/50 ml | INTRAVENOUS | Qty: 50

## 2017-10-17 MED FILL — BD POSIFLUSH NORMAL SALINE 0.9 % INJECTION SYRINGE: INTRAMUSCULAR | Qty: 10

## 2017-10-17 MED FILL — PROMETHAZINE 25 MG TAB: 25 mg | ORAL | Qty: 1

## 2017-10-17 MED FILL — LEVOTHYROXINE 25 MCG TAB: 25 mcg | ORAL | Qty: 1
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
## 2017-10-17 NOTE — Progress Notes (Signed)
Obstetrics Progress Note    Name: Linda [name] MRN: [id number]  SSN: [id number]    Date of Birth: 09/07/1990  Age: 27 y.o.  Sex: female     LOS: 8 days   Estimated Date of Delivery: None noted.  Gestational Age Today: Unknown      Subjective:     Patient admitted for pyelo, n/v, and pregancy. She was started on Labetalol for elevated BPs and she denies vomiting after taking meds. She is voiding, ambulating and pain controlled. She has a new OB appointment with Dr. Whitted 8/14.     Objective:     Vitals:  Blood pressure 115/46, pulse 84, temperature 98.3 ??F (36.8 ??C), resp. rate 21, height 5' 5" (1.651 m), weight 154.1 kg (339 lb 12.8 oz), last menstrual period 08/14/2017, SpO2 100 %, unknown if currently breastfeeding.   Temp (24hrs), Avg:98.6 ??F (37 ??C), Min:98.3 ??F (36.8 ??C), Max:99 ??F (37.2 ??C)    Systolic (24hrs), Avg:135 , Min:115 , Max:149      Diastolic (24hrs), Avg:62, Min:46, Max:68       Intake and Output:         Physical Exam:  Deferred       Labs:   Recent Results (from the past 36 hour(s))   RENAL FUNCTION PANEL    Collection Time: 10/16/17  9:40 AM   Result Value Ref Range    Sodium 135 (L) 136 - 145 mEq/L    Potassium 3.9 3.5 - 5.1 mEq/L    Chloride 101 98 - 107 mEq/L    CO2 23 21 - 32 mEq/L    Glucose 95 74 - 106 mg/dl    BUN 6 (L) 7 - 25 mg/dl    Creatinine 0.6 0.6 - 1.3 mg/dl    GFR est AA >60.0      GFR est non-AA >60      Calcium 9.5 8.5 - 10.1 mg/dl    Albumin 3.0 (L) 3.4 - 5.0 gm/dl    Phosphorus 4.7 2.5 - 4.9 mg/dl    Anion gap 10 5 - 15 mmol/L       Assessment and Plan:      Active Problems:    Back pain (10/09/2017)      Intractable vomiting with nausea (10/09/2017)       Discharge per primary team. Needs to go home with Labetalol as written. Patient to f/u with Dr. Whitted in our office 8/14 as scheduled.     Signed By: Ezechiel Stooksbury, MD     October 17, 2017

## 2017-10-17 NOTE — Progress Notes (Signed)
Pt in 4213 Linda [name] dx Vomiting/pregnant, spoke with OB and they said they will not write a prescription for narcotics or trazadone. Will let the patient know. Thank you. Jo 4W

## 2017-10-17 NOTE — Other (Addendum)
----------  DocumentID: [id number]------------------------------------------------              [facility]                       Patient Education Report         Name: Linda [name], Linda [name]                  Date: 10/10/2017    MRN: [id number]                    Time: 1:06:41 AM         Patient ordered video: 'Patient Safety: Stay Safe While you are in the Hospital'    from [id number] via phone number: [phone] at 1:06:41 AM    Description: This program outlines some of the precautions patients can take to ensure a speedy recovery without extra complications. The video emphasizes the importance of communicating with the healthcare team.    ----------DocumentID: [id number]------------------------------------------------                       [facility]          Patient Education Report - Discharge Summary        Date: 10/17/2017   Time: 2:44:39 PM   Name: Linda [name], Linda [name]   MRN: [id number]      Account Number: [id number]      Education History:        Patient ordered video: 'Patient Safety: Stay Safe While you are in the Hospital' from [id number] on 10/10/2017 01:06:41 AM
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
## 2017-10-17 NOTE — Progress Notes (Signed)
Pt in 4213 Linda [name] dx Vomiting/pregnant, spoke with OB and they said they will not write a prescription for narcotics or trazadone. Will let the patient know. Thank you. Ammie DaltonJo 4W

## 2017-10-17 NOTE — Progress Notes (Signed)
Obstetrics Progress Note    Name: Linda [name] MRN: [id number]  SSN: [id number]    Date of Birth: 1990-05-19  Age: 27 y.o.  Sex: female     LOS: 8 days   Estimated Date of Delivery: None noted.  Gestational Age Today: Unknown      Subjective:     Patient admitted for pyelo, n/v, and pregancy. She was started on Labetalol for elevated BPs and she denies vomiting after taking meds. She is voiding, ambulating and pain controlled. She has a new OB appointment with Dr. Renita PapaWhitted 8/14.     Objective:     Vitals:  Blood pressure 115/46, pulse 84, temperature 98.3 ??F (36.8 ??C), resp. rate 21, height 5\' 5"  (1.651 m), weight 154.1 kg (339 lb 12.8 oz), last menstrual period 08/14/2017, SpO2 100 %, unknown if currently breastfeeding.   Temp (24hrs), Avg:98.6 ??F (37 ??C), Min:98.3 ??F (36.8 ??C), Max:99 ??F (37.2 ??C)    Systolic (24hrs), Avg:135 , Min:115 , Max:149      Diastolic (24hrs), Avg:62, Min:46, Max:68       Intake and Output:         Physical Exam:  Deferred       Labs:   Recent Results (from the past 36 hour(s))   RENAL FUNCTION PANEL    Collection Time: 10/16/17  9:40 AM   Result Value Ref Range    Sodium 135 (L) 136 - 145 mEq/L    Potassium 3.9 3.5 - 5.1 mEq/L    Chloride 101 98 - 107 mEq/L    CO2 23 21 - 32 mEq/L    Glucose 95 74 - 106 mg/dl    BUN 6 (L) 7 - 25 mg/dl    Creatinine 0.6 0.6 - 1.3 mg/dl    GFR est AA >91.4>60.0      GFR est non-AA >60      Calcium 9.5 8.5 - 10.1 mg/dl    Albumin 3.0 (L) 3.4 - 5.0 gm/dl    Phosphorus 4.7 2.5 - 4.9 mg/dl    Anion gap 10 5 - 15 mmol/L       Assessment and Plan:      Active Problems:    Back pain (10/09/2017)      Intractable vomiting with nausea (10/09/2017)       Discharge per primary team. Needs to go home with Labetalol as written. Patient to f/u with Dr. Renita PapaWhitted in our office 8/14 as scheduled.     Signed By: Golden Poparyn Terrilyn Tyner, MD     October 17, 2017

## 2017-10-29 LAB — HIV-1 AB, EXTERNAL

## 2017-10-29 LAB — HEPATITIS B SURFACE AG, EXTERNAL
HBSAG, EXTERNAL: NEGATIVE
HBsAg, External: NEGATIVE

## 2017-10-29 LAB — N GONORRHOEAE, DNA PROBE, EXTERNAL
Gonorrhea, External: NEGATIVE
N. GONORRHEA, EXTERNAL: NEGATIVE

## 2017-10-29 LAB — RPR, EXTERNAL

## 2017-10-29 LAB — CHLAMYDIA DNA PROBE, EXTERNAL
CHLAMYDIA, EXTERNAL: NEGATIVE
Chlamydia, External: NEGATIVE

## 2017-10-29 LAB — TYPE, ABO & RH, EXTERNAL

## 2017-10-29 LAB — ANTIBODY SCREEN, EXTERNAL
ANTIBODY SCREEN, EXTERNAL: NEGATIVE
Antibody screen, External: NEGATIVE

## 2017-10-29 LAB — RUBELLA AB, IGG, EXTERNAL
RUBELLA, EXTERNAL: IMMUNE
Rubella, External: IMMUNE

## 2017-11-05 ENCOUNTER — Inpatient Hospital Stay: Payer: MEDICAID

## 2017-11-05 LAB — CBC WITH AUTOMATED DIFF
BASOPHILS: 0.2 % (ref 0–3)
EOSINOPHILS: 1 % (ref 0–5)
HCT: 30.3 % — ABNORMAL LOW (ref 37.0–50.0)
HGB: 10.4 gm/dl — ABNORMAL LOW (ref 13.0–17.2)
IMMATURE GRANULOCYTES: 0.2 % (ref 0.0–3.0)
LYMPHOCYTES: 31 % (ref 28–48)
MCH: 25.6 pg (ref 25.4–34.6)
MCHC: 34.3 gm/dl (ref 30.0–36.0)
MCV: 74.6 fL — ABNORMAL LOW (ref 80.0–98.0)
MONOCYTES: 6.1 % (ref 1–13)
MPV: 9 fL (ref 6.0–10.0)
NEUTROPHILS: 61.5 % (ref 34–64)
NRBC: 0 (ref 0–0)
PLATELET: 193 10*3/uL (ref 140–450)
RBC: 4.06 M/uL (ref 3.60–5.20)
RDW-SD: 40.8 (ref 36.4–46.3)
WBC: 5.3 10*3/uL (ref 4.0–11.0)

## 2017-11-05 LAB — METABOLIC PANEL, COMPREHENSIVE
ALT (SGPT): 17 U/L (ref 12–78)
AST (SGOT): 12 U/L — ABNORMAL LOW (ref 15–37)
Albumin: 3 gm/dl — ABNORMAL LOW (ref 3.4–5.0)
Alk. phosphatase: 49 U/L (ref 45–117)
Anion gap: 9 mmol/L (ref 5–15)
BUN: 5 mg/dl — ABNORMAL LOW (ref 7–25)
Bilirubin, total: 0.4 mg/dl (ref 0.2–1.0)
CO2: 23 mEq/L (ref 21–32)
Calcium: 8.7 mg/dl (ref 8.5–10.1)
Chloride: 108 mEq/L — ABNORMAL HIGH (ref 98–107)
Creatinine: 0.4 mg/dl — ABNORMAL LOW (ref 0.6–1.3)
GFR est AA: >60
GFR est non-AA: >60
Glucose: 74 mg/dl (ref 74–106)
Potassium: 3.5 mEq/L (ref 3.5–5.1)
Protein, total: 6.1 gm/dl — ABNORMAL LOW (ref 6.4–8.2)
Sodium: 140 mEq/L (ref 136–145)

## 2017-11-05 LAB — URINALYSIS W/ RFLX MICROSCOPIC
Bilirubin, Urine: NEGATIVE
Bilirubin: NEGATIVE
Blood, Urine: NEGATIVE
Blood: NEGATIVE
Glucose, Ur: NEGATIVE mg/dl
Glucose: NEGATIVE mg/dl
Ketone: 40 mg/dl — AB
Ketones, Urine: 40 mg/dl — AB
Leukocyte Esterase, Urine: NEGATIVE
Leukocyte Esterase: NEGATIVE
Nitrite, Urine: NEGATIVE
Nitrites: NEGATIVE
Protein, UA: NEGATIVE mg/dl
Protein: NEGATIVE mg/dl
Specific Gravity, UA: 1.02 (ref 1.005–1.030)
Specific gravity: 1.02 (ref 1.005–1.030)
Urobilinogen, UA, POCT: 0.2 mg/dl (ref 0.0–1.0)
Urobilinogen: 0.2 mg/dl (ref 0.0–1.0)
pH (UA): 6.5 (ref 5.0–9.0)
pH, UA: 6.5 (ref 5.0–9.0)

## 2017-11-05 LAB — POC URINE MICROSCOPIC

## 2017-11-05 LAB — COMPREHENSIVE METABOLIC PANEL
ALT: 17 U/L (ref 12–78)
AST: 12 U/L — ABNORMAL LOW (ref 15–37)
Albumin: 3 gm/dl — ABNORMAL LOW (ref 3.4–5.0)
Alkaline Phosphatase: 49 U/L (ref 45–117)
Anion Gap: 9 mmol/L (ref 5–15)
BUN: 5 mg/dl — ABNORMAL LOW (ref 7–25)
CO2: 23 mEq/L (ref 21–32)
Calcium: 8.7 mg/dl (ref 8.5–10.1)
Chloride: 108 mEq/L — ABNORMAL HIGH (ref 98–107)
Creatinine: 0.4 mg/dl — ABNORMAL LOW (ref 0.6–1.3)
EGFR IF NonAfrican American: >60
GFR African American: >60
Glucose: 74 mg/dl (ref 74–106)
Potassium: 3.5 mEq/L (ref 3.5–5.1)
Sodium: 140 mEq/L (ref 136–145)
Total Bilirubin: 0.4 mg/dl (ref 0.2–1.0)
Total Protein: 6.1 gm/dl — ABNORMAL LOW (ref 6.4–8.2)

## 2017-11-05 LAB — CBC WITH AUTO DIFFERENTIAL
Basophils %: 0.2 % (ref 0–3)
Eosinophils %: 1 % (ref 0–5)
Hematocrit: 30.3 % — ABNORMAL LOW (ref 37.0–50.0)
Hemoglobin: 10.4 gm/dl — ABNORMAL LOW (ref 13.0–17.2)
Immature Granulocytes: 0.2 % (ref 0.0–3.0)
Lymphocytes %: 31 % (ref 28–48)
MCH: 25.6 pg (ref 25.4–34.6)
MCHC: 34.3 gm/dl (ref 30.0–36.0)
MCV: 74.6 fL — ABNORMAL LOW (ref 80.0–98.0)
MPV: 9 fL (ref 6.0–10.0)
Monocytes %: 6.1 % (ref 1–13)
Neutrophils %: 61.5 % (ref 34–64)
Nucleated RBCs: 0 (ref 0–0)
Platelets: 193 10*3/uL (ref 140–450)
RBC: 4.06 M/uL (ref 3.60–5.20)
RDW-SD: 40.8 (ref 36.4–46.3)
WBC: 5.3 10*3/uL (ref 4.0–11.0)

## 2017-11-05 MED ORDER — LABETALOL 100 MG TAB
100 mg | Freq: Two times a day (BID) | ORAL | Status: DC
Start: 2017-11-05 — End: 2017-11-07
  Administered 2017-11-06 – 2017-11-07 (×3): via ORAL

## 2017-11-05 MED ORDER — PRENATAL VITAMIN WITHOUT CALCIUM #5-IRON-FA 106.5 MG-1 MG CAP
Freq: Every day | ORAL | Status: DC
Start: 2017-11-05 — End: 2017-11-07
  Administered 2017-11-06 – 2017-11-07 (×2): via ORAL

## 2017-11-05 MED ORDER — LACTATED RINGERS BOLUS IV
Freq: Once | INTRAVENOUS | Status: AC
Start: 2017-11-05 — End: 2017-11-05
  Administered 2017-11-05: 19:00:00 via INTRAVENOUS

## 2017-11-05 MED ORDER — LACTATED RINGERS IV
INTRAVENOUS | Status: DC
Start: 2017-11-05 — End: 2017-11-07
  Administered 2017-11-05 – 2017-11-06 (×3): via INTRAVENOUS

## 2017-11-05 MED ORDER — ONDANSETRON (PF) 4 MG/2 ML INJECTION
4 mg/2 mL | Freq: Once | INTRAMUSCULAR | Status: DC
Start: 2017-11-05 — End: 2017-11-05

## 2017-11-05 MED ORDER — PROMETHAZINE 25 MG/ML INJECTION
25 mg/mL | INTRAMUSCULAR | Status: AC
Start: 2017-11-05 — End: 2017-11-05
  Administered 2017-11-05: 20:00:00 via INTRAVENOUS

## 2017-11-05 MED ORDER — CYCLOBENZAPRINE 10 MG TAB
10 mg | Freq: Once | ORAL | Status: AC
Start: 2017-11-05 — End: 2017-11-05
  Administered 2017-11-05: 23:00:00 via ORAL

## 2017-11-05 MED ORDER — FERROUS SULFATE 325 MG (65 MG ELEMENTAL IRON) TAB
325 mg (65 mg iron) | Freq: Every day | ORAL | Status: DC
Start: 2017-11-05 — End: 2017-11-07
  Administered 2017-11-06 – 2017-11-07 (×2): via ORAL

## 2017-11-05 MED FILL — PROMETHAZINE 25 MG/ML INJECTION: 25 mg/mL | INTRAMUSCULAR | Qty: 1

## 2017-11-05 MED FILL — LACTATED RINGERS IV: INTRAVENOUS | Qty: 500

## 2017-11-05 MED FILL — LACTATED RINGERS IV: INTRAVENOUS | Qty: 1000

## 2017-11-05 MED FILL — CYCLOBENZAPRINE 10 MG TAB: 10 mg | ORAL | Qty: 1

## 2017-11-05 NOTE — H&P (Signed)
History & Physical    Name: Linda [name] MRN: [id number]  SSN: [id number]    Date of Birth: 12-Nov-1990  Age: 27 y.o.  Sex: female        Subjective:     Estimated Date of Delivery: 04/21/18  OB History   Gravida Para Term Preterm AB Living   7 3 3   2 3    SAB TAB Ectopic Molar Multiple Live Births   1 1     0 3      # Outcome Date GA Lbr Len/2nd Weight Sex Delivery Anes PTL Lv   7 Current            6 Term 10/25/14 [redacted]w[redacted]d  3.09 kg M VAGINAL DELI EPIDURAL AN N LIV   5 SAB 2015              Birth Comments: System Generated. Please review and update pregnancy details.   4 Term 03/12/13 [redacted]w[redacted]d  3.6 kg M VAGINAL DELI EPIDURAL AN N LIV   3 Term 08/28/11 [redacted]w[redacted]d  3.547 kg F VAGINAL DELI EPIDURAL AN N LIV   2 TAB            1 Gravida                Linda [name] is a 27 y.o.  [id number] admitted with pregnancy at [redacted]w[redacted]d for nausea and vomiting ,back pain . Prenatal course . Please see prenatal records for details.she has no fever or chills , Patient was admitted 10/09/2017 for same complain was diagnose with pyelonephritis ,she was on antibiotic for one one week ,patient did not take her blood pressure medication last 4 days as she was vomiting ,she ran out of phenergan and she came to get evaluated .she was in office 8/14 blood work done for her ,but did not see dr whitted and rescheduled her appointment to 11/13/2017 to see him .    Past Medical History:   Diagnosis Date   ??? ADHD (attention deficit hyperactivity disorder)    ??? Bipolar disorder (HCC)    ??? BV (bacterial vaginosis)     On Several Occasions   ??? Chlamydia 04/25/2011    07/07/14, 04/12/14, 02/14/14, 07/03/12, 04/25/11    ??? Depression    ??? Essential hypertension    ??? Gestational diabetes    ??? Gonorrhea 01/27/2013    07/07/14, 02/14/14, 01/27/13   ??? Headache 03/10/2014    CT Head W/O Contrast: Normal   ??? Hypokalemia 05/22/2011    3.4 mmol/L   ??? Hypomagnesemia 03/09/2014    1.5 mg/dL   ??? Hyponatremia 01/03/2011    132 mmol/L   ??? Hypothyroidism    ??? Microcytic anemia 01/01/2008    ??? Morbid obesity (HCC)    ??? OCD (obsessive compulsive disorder)    ??? Recurrent UTI    ??? Sacroiliitis (HCC) 11/12/2011    Noted on Lumbar X-ray: Left Sided Sacroliitis    ??? Strep throat 05/22/2011    + RS   ??? Vision decreased      Past Surgical History:   Procedure Laterality Date   ??? HX DILATION AND CURETTAGE  09/2013     Social History     Socioeconomic History   ??? Marital status: SINGLE     Spouse name: Not on file   ??? Number of children: Not on file   ??? Years of education: Not on file   ??? Highest education level: Not on file   Tobacco Use   ???  Smoking status: Never Smoker   ??? Smokeless tobacco: Never Used   Substance and Sexual Activity   ??? Alcohol use: Yes   ??? Drug use: Yes     Types: Marijuana   ??? Sexual activity: Yes     Partners: Male     Birth control/protection: None   Other Topics Concern     Family History   Problem Relation Age of Onset   ??? Hypertension Maternal Grandmother    ??? Thyroid Disease Maternal Grandmother    ??? Diabetes Maternal Grandmother    ??? Hypertension Maternal Grandfather    ??? Hypertension Paternal Grandfather    ??? Hypertension Mother    ??? Asthma Mother    ??? Diabetes Mother    ??? Thyroid Disease Brother    ??? Diabetes Brother    ??? No Known Problems Sister    ??? Diabetes Father    ??? Diabetes Paternal Grandmother    ??? Thyroid Disease Maternal Aunt        Allergies   Allergen Reactions   ??? Percocet [Oxycodone-Acetaminophen] Nausea and Vomiting     Prior to Admission medications    Medication Sig Start Date End Date Taking? Authorizing Provider   labetalol (NORMODYNE) 200 mg tablet Take 1 Tab by mouth every twelve (12) hours. 10/17/17   Simoes, Reina Fuseuchita S, MD   promethazine (PHENERGAN) 25 mg tablet Take 1 Tab by mouth every eight (8) hours as needed for Nausea. 10/17/17   Simoes, Reina Fuseuchita S, MD   levothyroxine (SYNTHROID) 75 mcg tablet Take 1 Tab by mouth Daily (before breakfast). Indications: HYPOTHYROIDISM  Patient taking differently: Take 100 mcg by mouth Daily (before  breakfast). Indications: hypothyroidism 07/29/14   Karolee StampsLucas, Elliott W, MD            Objective:     Vitals:  Vitals:    11/05/17 1330 11/05/17 1345 11/05/17 1400 11/05/17 1415   BP: 137/58 142/58 151/77 157/69   Pulse: 64 62 78 61   Temp:       Weight:       Height:            Physical Exam:  Patient without distress.  Heart: Regular rate and rhythm  Lung: clear to auscultation throughout lung fields, no wheezes, no rales, no rhonchi and normal respiratory effort  Abdomen: soft, nontender  Fundus: soft and non tender ,16 weeks size   Perineum: blood absent, amniotic fluid absent  Cervical Exam: 0 cm dilated    0% effaced    -3 station    Cervical Position: posterior position  Fetal Heart Rate: 145 bpm     Prenatal Labs:         Assessment/Plan:     Principal Problem:    Nausea and vomiting during pregnancy prior to [redacted] weeks gestation (11/05/2017)    Active Problems:    Muscular abdominal pain in right flank (11/05/2017)      Chronic hypertension complicating or reason for care during pregnancy, second trimester (11/05/2017)         Plan: cbc ,cmp ,urine analysis ,c/s ,iv fluid ,phenergan iv ,flexril for muscular pain ,tylenol   Observation .    Signed By:  Leonette NuttingSeham N Kayce Betty, MD     November 05, 2017

## 2017-11-05 NOTE — Other (Signed)
Report to A Rodriguez RN

## 2017-11-05 NOTE — Other (Addendum)
[id number]- Paged Dr Alphonzo DublinMelek at this time.    1623- Dr. Alphonzo DublinMelek notified of UA results and lab work results. Patient has not vomited since she's been here Orders for BRAT diet and give patient crackers and ginger ale and if tolerated may give flexeril and PO meds.    1658- Discussed POC with patient at the bedside and gave her crackers and ginger ale, patient said she felt a bit dizzy. I educated her on side effects of phenergan and explained it could be from that. BP taken and WNL. Patient denied nausea. Left food and drink at bedside and explained that the plan was to see if she could tolerate them both so she could take po medications. Patient agrees.     1712- Went in to check on patient she is sleeping soundly and snoring, still has not eaten to drank anything.     1715- Dr Alphonzo DublinMelek notified that patient seems comfortable and is sleeping soundly. Has not eaten or drank anything to try po medication. Will try later when she awakens. Plan is to keep her over night for observation and dc home in am with prescription for rectal phenergan.

## 2017-11-05 NOTE — Other (Signed)
Report recived from [name], RN    [id number]- Dr Alphonzo DublinMelek updated on pt and c/o. Order for rectal phenergan 25 mg to be given in am. Pt to eat in the morning and if feeling better can go home with a prescription of rectal phenergan.

## 2017-11-05 NOTE — Other (Signed)
This G7P3 16.1 presents c/o severe vomiting, abdominal cramping and back pain that began 4 days ago that she relates to her previous kidney issues. Patient states she has not been able to keep down her BP medication and is experiencing h/a and blurry vision. Oriented to room, FHT 135-140.

## 2017-11-05 NOTE — Progress Notes (Signed)
1730 Report from [facility] Delara RN    1800 Patient in bed, resting. Denies any new onset pain; describes current back pain. Reviewed plan of care, including available medications, PO challenge, and observation status. Patient denies any questions at this time. Patient to bathroom to void.

## 2017-11-05 NOTE — Progress Notes (Signed)
Reviewed patient status with Dr. Melek; UA sent to lab.

## 2017-11-05 NOTE — Progress Notes (Incomplete Revision)
1730 Report from [facility] Delara RN    1800 Patient in bed, resting. Denies any new onset pain; describes current back pain. Reviewed plan of care, including available medications, PO challenge, and observation status. Patient denies any questions at this time. Patient to bathroom to void.     1900 Patient in bed, sleeping.

## 2017-11-05 NOTE — H&P (Signed)
History & Physical    Name: Linda [name] MRN: [id number]  SSN: [id number]    Date of Birth: [birth year]  Age: 27 y.o.  Sex: female        Subjective:     Estimated Date of Delivery: 04/21/18  OB History   Gravida Para Term Preterm AB Living   7 3 3   2 3    SAB TAB Ectopic Molar Multiple Live Births   1 1     0 3      # Outcome Date GA Lbr Len/2nd Weight Sex Delivery Anes PTL Lv   7 Current            6 Term 10/25/14 [redacted]w[redacted]d  3.09 kg M VAGINAL DELI EPIDURAL AN N LIV   5 SAB 2015              Birth Comments: System Generated. Please review and update pregnancy details.   4 Term 03/12/13 [redacted]w[redacted]d  3.6 kg M VAGINAL DELI EPIDURAL AN N LIV   3 Term 08/28/11 [redacted]w[redacted]d  3.547 kg F VAGINAL DELI EPIDURAL AN N LIV   2 TAB            1 Gravida                Linda [name] is a 27 y.o.  [id number] admitted with pregnancy at [redacted]w[redacted]d for nausea and vomiting ,back pain . Prenatal course . Please see prenatal records for details.she has no fever or chills , Patient was admitted 10/09/2017 for same complain was diagnose with pyelonephritis ,she was on antibiotic for one one week ,patient did not take her blood pressure medication last 4 days as she was vomiting ,she ran out of phenergan and she came to get evaluated .she was in office 8/14 blood work done for her ,but did not see dr whitted and rescheduled her appointment to 11/13/2017 to see him .    Past Medical History:   Diagnosis Date   ??? ADHD (attention deficit hyperactivity disorder)    ??? Bipolar disorder (HCC)    ??? BV (bacterial vaginosis)     On Several Occasions   ??? Chlamydia 04/25/2011    07/07/14, 04/12/14, 02/14/14, 07/03/12, 04/25/11    ??? Depression    ??? Essential hypertension    ??? Gestational diabetes    ??? Gonorrhea 01/27/2013    07/07/14, 02/14/14, 01/27/13   ??? Headache 03/10/2014    CT Head W/O Contrast: Normal   ??? Hypokalemia 05/22/2011    3.4 mmol/L   ??? Hypomagnesemia 03/09/2014    1.5 mg/dL   ??? Hyponatremia 01/03/2011    132 mmol/L   ??? Hypothyroidism    ??? Microcytic anemia 01/01/2008   ???  Morbid obesity (HCC)    ??? OCD (obsessive compulsive disorder)    ??? Recurrent UTI    ??? Sacroiliitis (HCC) 11/12/2011    Noted on Lumbar X-ray: Left Sided Sacroliitis    ??? Strep throat 05/22/2011    + RS   ??? Vision decreased      Past Surgical History:   Procedure Laterality Date   ??? HX DILATION AND CURETTAGE  09/2013     Social History     Socioeconomic History   ??? Marital status: SINGLE     Spouse name: Not on file   ??? Number of children: Not on file   ??? Years of education: Not on file   ??? Highest education level: Not on file   Tobacco Use   ???  Smoking status: Never Smoker   ??? Smokeless tobacco: Never Used   Substance and Sexual Activity   ??? Alcohol use: Yes   ??? Drug use: Yes     Types: Marijuana   ??? Sexual activity: Yes     Partners: Male     Birth control/protection: None   Other Topics Concern     Family History   Problem Relation Age of Onset   ??? Hypertension Maternal Grandmother    ??? Thyroid Disease Maternal Grandmother    ??? Diabetes Maternal Grandmother    ??? Hypertension Maternal Grandfather    ??? Hypertension Paternal Grandfather    ??? Hypertension Mother    ??? Asthma Mother    ??? Diabetes Mother    ??? Thyroid Disease Brother    ??? Diabetes Brother    ??? No Known Problems Sister    ??? Diabetes Father    ??? Diabetes Paternal Grandmother    ??? Thyroid Disease Maternal Aunt        Allergies   Allergen Reactions   ??? Percocet [Oxycodone-Acetaminophen] Nausea and Vomiting     Prior to Admission medications    Medication Sig Start Date End Date Taking? Authorizing Provider   labetalol (NORMODYNE) 200 mg tablet Take 1 Tab by mouth every twelve (12) hours. 10/17/17   Simoes, Reina Fuseuchita S, MD   promethazine (PHENERGAN) 25 mg tablet Take 1 Tab by mouth every eight (8) hours as needed for Nausea. 10/17/17   Simoes, Reina Fuseuchita S, MD   levothyroxine (SYNTHROID) 75 mcg tablet Take 1 Tab by mouth Daily (before breakfast). Indications: HYPOTHYROIDISM  Patient taking differently: Take 100 mcg by mouth Daily (before breakfast). Indications:  hypothyroidism 07/29/14   Karolee StampsLucas, Elliott W, MD            Objective:     Vitals:  Vitals:    11/05/17 1330 11/05/17 1345 11/05/17 1400 11/05/17 1415   BP: 137/58 142/58 151/77 157/69   Pulse: 64 62 78 61   Temp:       Weight:       Height:            Physical Exam:  Patient without distress.  Heart: Regular rate and rhythm  Lung: clear to auscultation throughout lung fields, no wheezes, no rales, no rhonchi and normal respiratory effort  Abdomen: soft, nontender  Fundus: soft and non tender ,16 weeks size   Perineum: blood absent, amniotic fluid absent  Cervical Exam: 0 cm dilated    0% effaced    -3 station    Cervical Position: posterior position  Fetal Heart Rate: 145 bpm     Prenatal Labs:         Assessment/Plan:     Principal Problem:    Nausea and vomiting during pregnancy prior to [redacted] weeks gestation (11/05/2017)    Active Problems:    Muscular abdominal pain in right flank (11/05/2017)      Chronic hypertension complicating or reason for care during pregnancy, second trimester (11/05/2017)         Plan: cbc ,cmp ,urine analysis ,c/s ,iv fluid ,phenergan iv ,flexril for muscular pain ,tylenol   Observation .    Signed By:  Leonette NuttingSeham N Elora Wolter, MD     November 05, 2017

## 2017-11-05 NOTE — Progress Notes (Signed)
Reviewed patient status with Dr. Alphonzo DublinMelek; UA sent to lab.

## 2017-11-06 ENCOUNTER — Observation Stay: Admit: 2017-11-06 | Payer: MEDICAID

## 2017-11-06 MED ORDER — PROMETHAZINE IN NS 12.5 MG/50 ML IV PIGGY BAG
12.5 mg/50 ml | INTRAVENOUS | Status: DC | PRN
Start: 2017-11-06 — End: 2017-11-07
  Administered 2017-11-06 – 2017-11-07 (×3): via INTRAVENOUS

## 2017-11-06 MED ORDER — PYRIDOXINE 50 MG TAB
50 mg | Freq: Four times a day (QID) | ORAL | Status: DC
Start: 2017-11-06 — End: 2017-11-07
  Administered 2017-11-06 – 2017-11-07 (×4): via ORAL

## 2017-11-06 MED ORDER — SODIUM CHLORIDE 0.9 % IV
5 mg/mL | INTRAVENOUS | Status: DC
Start: 2017-11-06 — End: 2017-11-07
  Administered 2017-11-06 – 2017-11-07 (×3): via INTRAVENOUS

## 2017-11-06 MED ORDER — PROMETHAZINE 25 MG RECTAL SUPPOSITORY
25 mg | Freq: Four times a day (QID) | RECTAL | Status: DC | PRN
Start: 2017-11-06 — End: 2017-11-07
  Administered 2017-11-06: 11:00:00 via RECTAL

## 2017-11-06 MED ORDER — SODIUM CHLORIDE 0.9 % IV
50225 mg/2 mL (25 mg/mL) | Freq: Two times a day (BID) | INTRAVENOUS | Status: DC
Start: 2017-11-06 — End: 2017-11-07
  Administered 2017-11-06 – 2017-11-07 (×2): via INTRAVENOUS

## 2017-11-06 MED ORDER — ACETAMINOPHEN 500 MG TAB
500 mg | Freq: Once | ORAL | Status: AC
Start: 2017-11-06 — End: 2017-11-06
  Administered 2017-11-06: 21:00:00 via ORAL

## 2017-11-06 MED FILL — PROMETHAZINE IN NS 12.5 MG/50 ML IV PIGGY BAG: 12.5 mg/50 ml | INTRAVENOUS | Qty: 50

## 2017-11-06 MED FILL — FERROUS SULFATE 325 MG (65 MG ELEMENTAL IRON) TAB: 325 mg (65 mg iron) | ORAL | Qty: 1

## 2017-11-06 MED FILL — PHENADOZ 25 MG RECTAL SUPPOSITORY: 25 mg | RECTAL | Qty: 1

## 2017-11-06 MED FILL — PRENATAL-U 106.5 MG-1 MG CAPSULE: ORAL | Qty: 1

## 2017-11-06 MED FILL — LABETALOL 100 MG TAB: 100 mg | ORAL | Qty: 2

## 2017-11-06 MED FILL — RANITIDINE 50 MG/2 ML (25 MG/ML) INJECTION: 50 mg/2 mL (25 mg/mL) | INTRAMUSCULAR | Qty: 2

## 2017-11-06 MED FILL — VITAMIN B-6 50 MG TABLET: 50 mg | ORAL | Qty: 1

## 2017-11-06 MED FILL — SODIUM CHLORIDE 0.9 % IV: INTRAVENOUS | Qty: 1000

## 2017-11-06 MED FILL — ACETAMINOPHEN 500 MG TAB: 500 mg | ORAL | Qty: 2

## 2017-11-06 NOTE — Other (Signed)
Dietary called for pt food order. Order placed

## 2017-11-06 NOTE — Other (Signed)
SBAR report given to S. Scarborough, RN

## 2017-11-06 NOTE — Progress Notes (Addendum)
2120 Pt removed toco monitoring said it "was too tight."      Ptcontinues to remove BP cuff     0640 Through the night pt has denied any feelings of contractions and cramping

## 2017-11-06 NOTE — Progress Notes (Signed)
Updated Dr. Hollander on pts BP ok to hold 2100 dose

## 2017-11-06 NOTE — Progress Notes (Signed)
Obstetrics Progress Note    Name: Linda [name] MRN: [id number]  SSN: [id number]    Date of Birth: June 10, 1990  Age: 27 y.o.  Sex: female     LOS: 0 days   Estimated Date of Delivery: 04/21/18  Gestational Age Today: [redacted]w[redacted]d      Subjective:     Patient admitted for nausea and vomiting. She has been getting phenergan rectally and says that the last time she received it, it did not work. She claims that Reglan, Zofran do not work either. She tried to eat some toast and claims that some of it came up. She still has abdominal cramping.     Objective:     Vitals:  Blood pressure 142/61, pulse 84, temperature 99 ??F (37.2 ??C), height $RemoveBefo'5\' 5"'NGCdlaoqOXE$  (1.651 m), weight 154.2 kg (340 lb), last menstrual period 08/14/2017, SpO2 97 %, unknown if currently breastfeeding.   Temp (24hrs), Avg:98.8 ??F (37.1 ??C), Min:98.6 ??F (37 ??C), Max:99 ??F (37.2 ??C)    Systolic (81OFB), PZW:258 , Min:123 , NID:782      Diastolic (42PNT), IRW:43, Min:53, Max:105       Labs:   Recent Results (from the past 36 hour(s))   URINALYSIS W/ RFLX MICROSCOPIC    Collection Time: 11/05/17  2:35 PM   Result Value Ref Range    Color YELLOW YELLOW,STRAW      Appearance SLIGHTLY CLOUDY (A) CLEAR      Glucose NEGATIVE NEGATIVE,Negative mg/dl    Bilirubin NEGATIVE NEGATIVE,Negative      Ketone 40 (A) NEGATIVE,Negative mg/dl    Specific gravity 1.020 1.005 - 1.030      Blood NEGATIVE NEGATIVE,Negative      pH (UA) 6.5 5.0 - 9.0      Protein NEGATIVE NEGATIVE,Negative mg/dl    Urobilinogen 0.2 0.0 - 1.0 mg/dl    Nitrites NEGATIVE NEGATIVE,Negative      Leukocyte Esterase NEGATIVE NEGATIVE,Negative     CULTURE, URINE    Collection Time: 11/05/17  2:35 PM   Result Value Ref Range    Isolate (A)       >100,000 CFU/mL  Gram Negative Bacilli Isolated  Identification And Susceptibility To Follow     POC URINE MICROSCOPIC    Collection Time: 11/05/17  2:35 PM   Result Value Ref Range    Epithelial cells, squamous 30-49 /LPF    WBC 5-9 /HPF    Bacteria OCCASIONAL /HPF    CBC WITH AUTOMATED DIFF    Collection Time: 11/05/17  3:34 PM   Result Value Ref Range    WBC 5.3 4.0 - 11.0 1000/mm3    RBC 4.06 3.60 - 5.20 M/uL    HGB 10.4 (L) 13.0 - 17.2 gm/dl    HCT 30.3 (L) 37.0 - 50.0 %    MCV 74.6 (L) 80.0 - 98.0 fL    MCH 25.6 25.4 - 34.6 pg    MCHC 34.3 30.0 - 36.0 gm/dl    PLATELET 193 140 - 450 1000/mm3    MPV 9.0 6.0 - 10.0 fL    RDW-SD 40.8 36.4 - 46.3      NRBC 0 0 - 0      IMMATURE GRANULOCYTES 0.2 0.0 - 3.0 %    NEUTROPHILS 61.5 34 - 64 %    LYMPHOCYTES 31.0 28 - 48 %    MONOCYTES 6.1 1 - 13 %    EOSINOPHILS 1.0 0 - 5 %    BASOPHILS 0.2 0 - 3 %   METABOLIC PANEL,  COMPREHENSIVE    Collection Time: 11/05/17  3:34 PM   Result Value Ref Range    Sodium 140 136 - 145 mEq/L    Potassium 3.5 3.5 - 5.1 mEq/L    Chloride 108 (H) 98 - 107 mEq/L    CO2 23 21 - 32 mEq/L    Glucose 74 74 - 106 mg/dl    BUN 5 (L) 7 - 25 mg/dl    Creatinine 0.4 (L) 0.6 - 1.3 mg/dl    GFR est AA >60.0      GFR est non-AA >60      Calcium 8.7 8.5 - 10.1 mg/dl    AST (SGOT) 12 (L) 15 - 37 U/L    ALT (SGPT) 17 12 - 78 U/L    Alk. phosphatase 49 45 - 117 U/L    Bilirubin, total 0.4 0.2 - 1.0 mg/dl    Protein, total 6.1 (L) 6.4 - 8.2 gm/dl    Albumin 3.0 (L) 3.4 - 5.0 gm/dl    Anion gap 9 5 - 15 mmol/L       Assessment and Plan:      Principal Problem:    Nausea and vomiting during pregnancy prior to [redacted] weeks gestation (11/05/2017)    Active Problems:    Muscular abdominal pain in right flank (11/05/2017)      Chronic hypertension complicating or reason for care during pregnancy, second trimester (11/05/2017)      Vomiting (11/05/2017)      Pregnant (11/05/2017)     Will try IV phenergan and add zantac, B6      Signed By: Lacretia Leigh, MD     November 06, 2017

## 2017-11-06 NOTE — Progress Notes (Signed)
Dr. Melek called and updated on pt. No new orders received.

## 2017-11-06 NOTE — Progress Notes (Signed)
Called down to the kitchen to check on pts food. States they are bringing it up.

## 2017-11-06 NOTE — Progress Notes (Signed)
Called by the nurse Stacey that the patient was angry about being placed on a BRAT diet. The patient pulled out her IV and was acting aggressively and demanded to speak to me. When I got there, she was crying and yelling. Apparently she wanted a cheeseburger but had told me when I rounded on her that she had thrown up her toast and was feeling so nauseated that she could not scoop her own apple sauce.     I explained to her that the changes I made (BRAT diet, IV fluids with added folic acid and thiamine, B6, IV zantac) were all to help her to feel better. If she is not tolerating a regular diet than it is better to either keep her NPO or to be given foods that will not challenge and upset her GI system. I explained this to her. She was screaming and crying and began hyperventilating. She kept saying that she would not eat anything on the BRAT diet menu and that we can just discharge her. I explained that I would not discharge her since she was still feeling poorly. If she wanted to go home, she could leave AMA and without her prescriptions. I told her it was better for her to stay, allow us to help treat her sx so she can feel better.     She said, "I don't want anything else, I am done talking."     I told her she could have a regular diet, but that just because she wanted a cheeseburger did not mean her stomach could tolerate it. She is adamant that she be allowed to eat anything she wants, despite knowing that she will vomit.     I left the room and Stacey told me that she is thinking about continued admission versus leaving AMA.     She is probably a candidate for home health with IV fluid and antiemetics. Will speak to Shannon tomororw.

## 2017-11-06 NOTE — Progress Notes (Signed)
1420: Dr Hollander called updated on pt requesting to stay. Ok to switch to regular diet.   1425: Picc team up on unit. Pt declined piv at this time

## 2017-11-06 NOTE — Progress Notes (Signed)
Dr. Hollander on unit to see pt. Pt wanting to leave. Pt educated on diet and medications. Pt ripped out PIV  States she wants to leave.

## 2017-11-06 NOTE — Progress Notes (Signed)
Dr. Hollander paged

## 2017-11-06 NOTE — Progress Notes (Signed)
Bedside and Verbal shift change report given to Vikki L Lewis, RN   (oncoming nurse) by S. Pafford, RN  (offgoing nurse). Report included the following information SBAR, MAR and Recent Results.     Pt resting in bed.  No concerns at this time.

## 2017-11-06 NOTE — Progress Notes (Signed)
Dr. Hollander called updated on pt requesting something for headache. Updated on unable to get doppler. Order to U/S  And tylenol

## 2017-11-06 NOTE — Progress Notes (Signed)
 2120 Pt removed toco monitoring said it was too tight.      Ptcontinues to remove BP cuff     0640 Through the night pt has denied any feelings of contractions and cramping

## 2017-11-06 NOTE — Progress Notes (Signed)
Bedside and Verbal shift change report given to Si GaulVikki L Lewis, RN   (oncoming nurse) by Kathie RhodesS. Pafford, RN  Physiological scientist(offgoing nurse). Report included the following information SBAR, MAR and Recent Results.     Pt resting in bed.  No concerns at this time.

## 2017-11-06 NOTE — Progress Notes (Signed)
Dr. Nelle DonHollander called updated on pt requesting something for headache. Updated on unable to get doppler. Order to U/S  And tylenol

## 2017-11-06 NOTE — Progress Notes (Signed)
Dr. Alphonzo DublinMelek called and updated on pt. No new orders received.

## 2017-11-06 NOTE — Progress Notes (Signed)
Dr. Nelle Don on unit to see pt. Pt wanting to leave. Pt educated on diet and medications. Pt ripped out PIV  States she wants to leave.

## 2017-11-06 NOTE — Progress Notes (Signed)
1420: Dr Nelle DonHollander called updated on pt requesting to stay. Ok to switch to regular diet.   1425: Picc team up on unit. Pt declined piv at this time

## 2017-11-06 NOTE — Progress Notes (Signed)
Updated Dr. Nelle DonHollander on pts BP ok to hold 2100 dose

## 2017-11-06 NOTE — Progress Notes (Signed)
Obstetrics Progress Note    Name: Linda [name] MRN: [id number]  SSN: [id number]    Date of Birth: 10/23/1990  Age: 27 y.o.  Sex: female     LOS: 0 days   Estimated Date of Delivery: 04/21/18  Gestational Age Today: [redacted]w[redacted]d     Subjective:     Patient admitted for nausea and vomiting. She has been getting phenergan rectally and says that the last time she received it, it did not work. She claims that Reglan, Zofran do not work either. She tried to eat some toast and claims that some of it came up. She still has abdominal cramping.     Objective:     Vitals:  Blood pressure 142/61, pulse 84, temperature 99 ??F (37.2 ??C), height 5' 5" (1.651 m), weight 154.2 kg (340 lb), last menstrual period 08/14/2017, SpO2 97 %, unknown if currently breastfeeding.   Temp (24hrs), Avg:98.8 ??F (37.1 ??C), Min:98.6 ??F (37 ??C), Max:99 ??F (37.2 ??C)    Systolic (246KZL, ADJT:701, Min:123 , MXBL:390     Diastolic (230SPQ, AZRA:07 Min:53, Max:105       Labs:   Recent Results (from the past 36 hour(s))   URINALYSIS W/ RFLX MICROSCOPIC    Collection Time: 11/05/17  2:35 PM   Result Value Ref Range    Color YELLOW YELLOW,STRAW      Appearance SLIGHTLY CLOUDY (A) CLEAR      Glucose NEGATIVE NEGATIVE,Negative mg/dl    Bilirubin NEGATIVE NEGATIVE,Negative      Ketone 40 (A) NEGATIVE,Negative mg/dl    Specific gravity 1.020 1.005 - 1.030      Blood NEGATIVE NEGATIVE,Negative      pH (UA) 6.5 5.0 - 9.0      Protein NEGATIVE NEGATIVE,Negative mg/dl    Urobilinogen 0.2 0.0 - 1.0 mg/dl    Nitrites NEGATIVE NEGATIVE,Negative      Leukocyte Esterase NEGATIVE NEGATIVE,Negative     CULTURE, URINE    Collection Time: 11/05/17  2:35 PM   Result Value Ref Range    Isolate (A)       >100,000 CFU/mL  Gram Negative Bacilli Isolated  Identification And Susceptibility To Follow     POC URINE MICROSCOPIC    Collection Time: 11/05/17  2:35 PM   Result Value Ref Range    Epithelial cells, squamous 30-49 /LPF    WBC 5-9 /HPF    Bacteria OCCASIONAL /HPF   CBC WITH  AUTOMATED DIFF    Collection Time: 11/05/17  3:34 PM   Result Value Ref Range    WBC 5.3 4.0 - 11.0 1000/mm3    RBC 4.06 3.60 - 5.20 M/uL    HGB 10.4 (L) 13.0 - 17.2 gm/dl    HCT 30.3 (L) 37.0 - 50.0 %    MCV 74.6 (L) 80.0 - 98.0 fL    MCH 25.6 25.4 - 34.6 pg    MCHC 34.3 30.0 - 36.0 gm/dl    PLATELET 193 140 - 450 1000/mm3    MPV 9.0 6.0 - 10.0 fL    RDW-SD 40.8 36.4 - 46.3      NRBC 0 0 - 0      IMMATURE GRANULOCYTES 0.2 0.0 - 3.0 %    NEUTROPHILS 61.5 34 - 64 %    LYMPHOCYTES 31.0 28 - 48 %    MONOCYTES 6.1 1 - 13 %    EOSINOPHILS 1.0 0 - 5 %    BASOPHILS 0.2 0 - 3 %   METABOLIC PANEL,  COMPREHENSIVE    Collection Time: 11/05/17  3:34 PM   Result Value Ref Range    Sodium 140 136 - 145 mEq/L    Potassium 3.5 3.5 - 5.1 mEq/L    Chloride 108 (H) 98 - 107 mEq/L    CO2 23 21 - 32 mEq/L    Glucose 74 74 - 106 mg/dl    BUN 5 (L) 7 - 25 mg/dl    Creatinine 0.4 (L) 0.6 - 1.3 mg/dl    GFR est AA >60.0      GFR est non-AA >60      Calcium 8.7 8.5 - 10.1 mg/dl    AST (SGOT) 12 (L) 15 - 37 U/L    ALT (SGPT) 17 12 - 78 U/L    Alk. phosphatase 49 45 - 117 U/L    Bilirubin, total 0.4 0.2 - 1.0 mg/dl    Protein, total 6.1 (L) 6.4 - 8.2 gm/dl    Albumin 3.0 (L) 3.4 - 5.0 gm/dl    Anion gap 9 5 - 15 mmol/L       Assessment and Plan:      Principal Problem:    Nausea and vomiting during pregnancy prior to [redacted] weeks gestation (11/05/2017)    Active Problems:    Muscular abdominal pain in right flank (11/05/2017)      Chronic hypertension complicating or reason for care during pregnancy, second trimester (11/05/2017)      Vomiting (11/05/2017)      Pregnant (11/05/2017)     Will try IV phenergan and add zantac, B6      Signed By: Lacretia Leigh, MD     November 06, 2017

## 2017-11-06 NOTE — Progress Notes (Signed)
Called down to the kitchen to check on pts food. States they are bringing it up.

## 2017-11-06 NOTE — Progress Notes (Signed)
Called by the nurse Misty StanleyStacey that the patient was angry about being placed on a BRAT diet. The patient pulled out her IV and was acting aggressively and demanded to speak to me. When I got there, she was crying and yelling. Apparently she wanted a cheeseburger but had told me when I rounded on her that she had thrown up her toast and was feeling so nauseated that she could not scoop her own apple sauce.     I explained to her that the changes I made (BRAT diet, IV fluids with added folic acid and thiamine, B6, IV zantac) were all to help her to feel better. If she is not tolerating a regular diet than it is better to either keep her NPO or to be given foods that will not challenge and upset her GI system. I explained this to her. She was screaming and crying and began hyperventilating. She kept saying that she would not eat anything on the [employer] diet menu and that we can just discharge her. I explained that I would not discharge her since she was still feeling poorly. If she wanted to go home, she could leave AMA and without her prescriptions. I told her it was better for her to stay, allow us to help treat her sx so she can feel better.     She said, "I don't want anything else, I am done talking."     I told her she could have a regular diet, but that just because she wanted a cheeseburger did not mean her stomach could tolerate it. She is adamant that she be allowed to eat anything she wants, despite knowing that she will vomit.     I left the room and Misty StanleyStacey told me that she is thinking about continued admission versus leaving AMA.     She is probably a candidate for home health with IV fluid and antiemetics. [facility]

## 2017-11-07 LAB — CULTURE, URINE: Isolate: >100000 — AB

## 2017-11-07 MED FILL — SODIUM CHLORIDE 0.9 % IV: INTRAVENOUS | Qty: 1000

## 2017-11-07 MED FILL — FERROUS SULFATE 325 MG (65 MG ELEMENTAL IRON) TAB: 325 mg (65 mg iron) | ORAL | Qty: 1

## 2017-11-07 MED FILL — RANITIDINE 50 MG/2 ML (25 MG/ML) INJECTION: 50 mg/2 mL (25 mg/mL) | INTRAMUSCULAR | Qty: 2

## 2017-11-07 MED FILL — VITAMIN B-6 50 MG TABLET: 50 mg | ORAL | Qty: 1

## 2017-11-07 MED FILL — PHENADOZ 25 MG RECTAL SUPPOSITORY: 25 mg | RECTAL | Qty: 1

## 2017-11-07 MED FILL — PRENATAL-U 106.5 MG-1 MG CAPSULE: ORAL | Qty: 1

## 2017-11-07 MED FILL — LABETALOL 100 MG TAB: 100 mg | ORAL | Qty: 2

## 2017-11-07 MED FILL — PROMETHAZINE IN NS 12.5 MG/50 ML IV PIGGY BAG: 12.5 mg/50 ml | INTRAVENOUS | Qty: 50

## 2017-11-07 NOTE — Progress Notes (Signed)
Obstetrics Progress Note    Name: Linda [name] MRN: [id number]  SSN: [id number]    Date of Birth: 10/26/1990  Age: 26 y.o.  Sex: female     LOS: 1 day   Estimated Date of Delivery: 04/21/18  Gestational Age Today: [redacted]w[redacted]d      Subjective:     Patient admitted for hyperemesis gravidarum. States she  does not have nausea and vomiting.    Objective:     Vitals:  Blood pressure 137/53, pulse 78, temperature 99.1 ??F (37.3 ??C), resp. rate 12, height 5' 5" (1.651 m), weight 154.2 kg (340 lb), last menstrual period 08/14/2017, SpO2 97 %, unknown if currently breastfeeding.   Temp (24hrs), Avg:99.1 ??F (37.3 ??C), Min:99.1 ??F (37.3 ??C), Max:99.1 ??F (37.3 ??C)    Systolic (24hrs), Avg:125 , Min:100 , Max:142      Diastolic (24hrs), Avg:49, Min:36, Max:68       Intake and Output:         Physical Exam:  Abdomen: soft, nontender       Membranes:  Intact    Uterine Activity:  None    Fetal Heart Rate: 141      Labs: No results found for this or any previous visit (from the past 36 hour(s)).    Assessment and Plan:      Principal Problem:    Nausea and vomiting during pregnancy prior to [redacted] weeks gestation (11/05/2017)    Active Problems:    Muscular abdominal pain in right flank (11/05/2017)      Chronic hypertension complicating or reason for care during pregnancy, second trimester (11/05/2017)      Vomiting (11/05/2017)      Pregnant (11/05/2017)      GDM (gestational diabetes mellitus) (11/06/2017)      Nausea & vomiting (11/06/2017)       Hyperemesis Gravidarum:  Discharged home with follow-up in the office    Signed By: Analiese Krupka L Caera Enwright, MD     November 07, 2017

## 2017-11-07 NOTE — Other (Addendum)
Called in to room by pt. Pt sitting on side of bed and says "look at my face. Look at this knot. My face is swollen, it goes all the way back to my ear". Pt's left cheek is visibly edematous and extends slightly behind left ear. Firm to palpation. Pt states it hurts to touch it and she can "feel it when I talk, and I can see it when I look in the mirror" Pt denies itching and/or SOB. Pt states she noticed it right after she threw up just a minute ago. Pulse ox on and b/p taken. Dr.Whitted paged.

## 2017-11-07 NOTE — Progress Notes (Signed)
The patient is sitting in bed complaining of the left side of her face being swollen.  The patient was not started on any new medications today.  The patient may have a dental or a TMJ problem.  The patient has already been discharged however the nurse has been instructed to take the patient to the emergency room so they can evaluate her face.

## 2017-11-07 NOTE — Other (Signed)
Dr.Whitted at bedside assessing pt. Discharge order received.

## 2017-11-07 NOTE — Other (Signed)
Charge nurse [employer](A.Zeigler,RNC) at bedside to address pt's concerns about being discharged despite swelling in face. Discharge instructions given, pt encouraged to f/u in the ED for the swelling on her face. Pt verbalizes understanding and given opportunity to ask questions. Copy of discharge instructions provided.

## 2017-11-07 NOTE — Progress Notes (Signed)
The patient is sitting in bed complaining of the left side of her face being swollen.  The patient was not started on any new medications today.  The patient may have a dental or a TMJ problem.  The patient has already been discharged however the nurse has been instructed to take the patient to the emergency room so they can evaluate her face.

## 2017-11-07 NOTE — Progress Notes (Signed)
Obstetrics Progress Note    Name: Linda [name] MRN: [id number]  SSN: [id number]    Date of Birth: 13-Apr-1990  Age: 27 y.o.  Sex: female     LOS: 1 day   Estimated Date of Delivery: 04/21/18  Gestational Age Today: 915w3d      Subjective:     Patient admitted for hyperemesis gravidarum. States she  does not have nausea and vomiting.    Objective:     Vitals:  Blood pressure 137/53, pulse 78, temperature 99.1 ??F (37.3 ??C), resp. rate 12, height 5\' 5"  (1.651 m), weight 154.2 kg (340 lb), last menstrual period 08/14/2017, SpO2 97 %, unknown if currently breastfeeding.   Temp (24hrs), Avg:99.1 ??F (37.3 ??C), Min:99.1 ??F (37.3 ??C), Max:99.1 ??F (37.3 ??C)    Systolic (24hrs), Avg:125 , Min:100 , Max:142      Diastolic (24hrs), Avg:49, Min:36, Max:68       Intake and Output:         Physical Exam:  Abdomen: soft, nontender       Membranes:  Intact    Uterine Activity:  None    Fetal Heart Rate: 141      Labs: No results found for this or any previous visit (from the past 36 hour(s)).    Assessment and Plan:      Principal Problem:    Nausea and vomiting during pregnancy prior to [redacted] weeks gestation (11/05/2017)    Active Problems:    Muscular abdominal pain in right flank (11/05/2017)      Chronic hypertension complicating or reason for care during pregnancy, second trimester (11/05/2017)      Vomiting (11/05/2017)      Pregnant (11/05/2017)      GDM (gestational diabetes mellitus) (11/06/2017)      Nausea & vomiting (11/06/2017)       Hyperemesis Gravidarum:  Discharged home with follow-up in the office    Signed By: Wynetta FinesMatthew L Cydnee Fuquay, MD     November 07, 2017

## 2017-11-27 ENCOUNTER — Emergency Department: Admit: 2017-11-27 | Payer: MEDICAID

## 2017-11-27 ENCOUNTER — Inpatient Hospital Stay
Admit: 2017-11-27 | Discharge: 2017-11-28 | Disposition: A | Discharge status: Home / Self Care | Payer: MEDICAID | Attending: Emergency Medicine

## 2017-11-27 DIAGNOSIS — O162 Unspecified maternal hypertension, second trimester: Secondary | ICD-10-CM

## 2017-11-27 LAB — CBC WITH AUTOMATED DIFF
BASOPHILS: 0.3 % (ref 0–3)
EOSINOPHILS: 0.7 % (ref 0–5)
HCT: 30.5 % — ABNORMAL LOW (ref 37.0–50.0)
HGB: 10.5 gm/dl — ABNORMAL LOW (ref 13.0–17.2)
IMMATURE GRANULOCYTES: 0.3 % (ref 0.0–3.0)
LYMPHOCYTES: 17.7 % — ABNORMAL LOW (ref 28–48)
MCH: 25.4 pg (ref 25.4–34.6)
MCHC: 34.4 gm/dl (ref 30.0–36.0)
MCV: 73.7 fL — ABNORMAL LOW (ref 80.0–98.0)
MONOCYTES: 8.2 % (ref 1–13)
MPV: 8.9 fL (ref 6.0–10.0)
NEUTROPHILS: 72.8 % — ABNORMAL HIGH (ref 34–64)
NRBC: 0 (ref 0–0)
PLATELET: 214 10*3/uL (ref 140–450)
RBC: 4.14 M/uL (ref 3.60–5.20)
RDW-SD: 38.5 (ref 36.4–46.3)
WBC: 5.8 10*3/uL (ref 4.0–11.0)

## 2017-11-27 LAB — EKG, 12 LEAD, INITIAL
Atrial Rate: 95 {beats}/min
Calculated P Axis: 36 degrees
Calculated R Axis: 51 degrees
Calculated T Axis: 14 degrees
Diagnosis: NORMAL
P-R Interval: 138 ms
Q-T Interval: 358 ms
QRS Duration: 86 ms
QTC Calculation (Bezet): 449 ms
Ventricular Rate: 95 {beats}/min

## 2017-11-27 LAB — METABOLIC PANEL, BASIC
Anion gap: 7 mmol/L (ref 5–15)
BUN: 7 mg/dl (ref 7–25)
CO2: 23 mEq/L (ref 21–32)
Calcium: 8.9 mg/dl (ref 8.5–10.1)
Chloride: 107 mEq/L (ref 98–107)
Creatinine: 0.5 mg/dl — ABNORMAL LOW (ref 0.6–1.3)
GFR est AA: >60
GFR est non-AA: >60
Glucose: 99 mg/dl (ref 74–106)
Potassium: 3.3 mEq/L — ABNORMAL LOW (ref 3.5–5.1)
Sodium: 137 mEq/L (ref 136–145)

## 2017-11-27 LAB — TROPONIN I: Troponin-I: <0.015 ng/ml (ref 0.000–0.045)

## 2017-11-27 LAB — D DIMER: D DIMER: 0.56 ug/mL (FEU) — ABNORMAL HIGH (ref 0.01–0.50)

## 2017-11-27 LAB — BASIC METABOLIC PANEL
Anion Gap: 7 mmol/L (ref 5–15)
BUN: 7 mg/dl (ref 7–25)
CO2: 23 mEq/L (ref 21–32)
Calcium: 8.9 mg/dl (ref 8.5–10.1)
Chloride: 107 mEq/L (ref 98–107)
Creatinine: 0.5 mg/dl — ABNORMAL LOW (ref 0.6–1.3)
EGFR IF NonAfrican American: >60
GFR African American: >60
Glucose: 99 mg/dl (ref 74–106)
Potassium: 3.3 mEq/L — ABNORMAL LOW (ref 3.5–5.1)
Sodium: 137 mEq/L (ref 136–145)

## 2017-11-27 LAB — CBC WITH AUTO DIFFERENTIAL
Basophils %: 0.3 % (ref 0–3)
Eosinophils %: 0.7 % (ref 0–5)
Hematocrit: 30.5 % — ABNORMAL LOW (ref 37.0–50.0)
Hemoglobin: 10.5 gm/dl — ABNORMAL LOW (ref 13.0–17.2)
Immature Granulocytes: 0.3 % (ref 0.0–3.0)
Lymphocytes %: 17.7 % — ABNORMAL LOW (ref 28–48)
MCH: 25.4 pg (ref 25.4–34.6)
MCHC: 34.4 gm/dl (ref 30.0–36.0)
MCV: 73.7 fL — ABNORMAL LOW (ref 80.0–98.0)
MPV: 8.9 fL (ref 6.0–10.0)
Monocytes %: 8.2 % (ref 1–13)
Neutrophils %: 72.8 % — ABNORMAL HIGH (ref 34–64)
Nucleated RBCs: 0 (ref 0–0)
Platelets: 214 10*3/uL (ref 140–450)
RBC: 4.14 M/uL (ref 3.60–5.20)
RDW-SD: 38.5 (ref 36.4–46.3)
WBC: 5.8 10*3/uL (ref 4.0–11.0)

## 2017-11-27 LAB — EKG 12-LEAD
Atrial Rate: 95 {beats}/min
Diagnosis: NORMAL
P Axis: 36 degrees
P-R Interval: 138 ms
Q-T Interval: 358 ms
QRS Duration: 86 ms
QTc Calculation (Bazett): 449 ms
R Axis: 51 degrees
T Axis: 14 degrees
Ventricular Rate: 95 {beats}/min

## 2017-11-27 LAB — D-DIMER, QUANTITATIVE: D-Dimer, Quant: 0.56 ug/mL (FEU) — ABNORMAL HIGH (ref 0.01–0.50)

## 2017-11-27 LAB — TROPONIN: Troponin I: <0.015 ng/ml (ref 0.000–0.045)

## 2017-11-27 MED ORDER — IOPAMIDOL 76 % IV SOLN
370 mg iodine /mL (76 %) | Freq: Once | INTRAVENOUS | Status: AC
Start: 2017-11-27 — End: 2017-11-27
  Administered 2017-11-27: via INTRAVENOUS

## 2017-11-27 MED ORDER — ONDANSETRON (PF) 4 MG/2 ML INJECTION
4 mg/2 mL | Freq: Once | INTRAMUSCULAR | Status: AC
Start: 2017-11-27 — End: 2017-11-27
  Administered 2017-11-27: 23:00:00 via INTRAVENOUS

## 2017-11-27 MED ORDER — IPRATROPIUM-ALBUTEROL 2.5 MG-0.5 MG/3 ML NEB SOLUTION
2.5 mg-0.5 mg/3 ml | RESPIRATORY_TRACT | Status: AC
Start: 2017-11-27 — End: 2017-11-27
  Administered 2017-11-27: 22:00:00 via RESPIRATORY_TRACT

## 2017-11-27 MED ORDER — SODIUM CHLORIDE 0.9% BOLUS IV
0.9 % | INTRAVENOUS | Status: AC
Start: 2017-11-27 — End: 2017-11-27
  Administered 2017-11-27: 23:00:00 via INTRAVENOUS

## 2017-11-27 MED FILL — IPRATROPIUM-ALBUTEROL 2.5 MG-0.5 MG/3 ML NEB SOLUTION: 2.5 mg-0.5 mg/3 ml | RESPIRATORY_TRACT | Qty: 3

## 2017-11-27 MED FILL — ISOVUE-370  76 % INTRAVENOUS SOLUTION: 370 mg iodine /mL (76 %) | INTRAVENOUS | Qty: 80

## 2017-11-27 MED FILL — ONDANSETRON (PF) 4 MG/2 ML INJECTION: 4 mg/2 mL | INTRAMUSCULAR | Qty: 2

## 2017-11-27 NOTE — Progress Notes (Signed)
Peripheral Vascular Laboratory    Bilateral lower extremity venous exam completed.    Negative for deep and superficial thrombosis in the lower extremities bilaterally..    Final report to follow.  Linda [name], RVS

## 2017-11-27 NOTE — Progress Notes (Signed)
prelim code r note  ??  Last admission date 11/06/17---11/07/17    reason for last admission Pregnancy    MD Mele[place]  ??  disposition at discharge Home  ??  Insurance [employer] Medicaid  ??

## 2017-11-27 NOTE — ED Notes (Signed)
Pt provided with blankets, requests something to eat or drink.  Advised of NPO status until cleared by ED providers.

## 2017-11-27 NOTE — ED Notes (Signed)
Medic in room, pt states "If that nurse comes back in here again, I will lay hands upon her and do harm".  Medic relayed message to this nurse who in turn relayed message to security, NP, and ED provider.  Security and NP at bedside.

## 2017-11-27 NOTE — ED Triage Notes (Signed)
Pt states for past 3 days she has been having chest pain with shortness of breath feeling like her chest is caving in, also having abdominal pain with it, is [redacted] weeks pregnant

## 2017-11-27 NOTE — ED Provider Notes (Signed)
[place]  Emergency Department Treatment Report      Patient: Linda [name] Age: 27 y.o. Sex: female    Date of Birth: January 05, 1991 Admit Date: 11/27/2017 PCP: None   MRN: [id number]  CSN: [id number]     Room: OTF/OTF Time Dictated: 5:54 PM      Attending MD: Minna Merritts, MD  APC:  Finis Bud, NP-C    Chief Complaint   Chief Complaint   Patient presents with   ??? Chest Pain   ??? Abdominal Pain   ??? Shortness of Breath       History of Present Illness   27 y.o. female who is a G5, P4, [redacted] weeks pregnant presenting with 3 days of shortness of breath, chest tightness, dry cough.  She states that her symptoms have been getting progressively worse over the past 3 days.  She describes of breath chest pain as a tightness sensation that is worse when she takes a deep breath in.  She is not having any nasal discharge or drainage.  She also having a sore throat that is not causing her to have any difficulty swallowing or change in phonation.  States she is also having nausea and vomiting, she was admitted for the symptoms in August and was given a prescription for Phenergan which she has now ran out of it.  She states she is been unable to get a refill of this prescription because she recently fired her OB/GYN, Dr. Mable Paris.  She also states that she has not felt her baby move since she was discharged from the hospital approximately 1 month ago.  She has not followed up for this.  States that she is also supposed to be taking labetalol for hypertension during her pregnancy but she also has been out of this medication as well.  She does not have a history of asthma.    Review of Systems   Constitutional: No fever or chills  Eyes: No visual symptoms, eye pain or redness.   ENT: As above  Neck: No pain, stiffness, or swollen glands.   Respiratory: As above  Cardiovascular: As above  Gastrointestinal: As above   genitourinary: No dysuria, frequency, or urgency.   Genital: No discharge or bleeding.   Musculoskeletal: Lower extremity tenderness to palpation.  Integumentary: No rashes or wounds.  Neurological: No headaches or sensory motor symptoms    Denies complaints in all other systems.    Past Medical/Surgical History     Past Medical History:   Diagnosis Date   ??? ADHD (attention deficit hyperactivity disorder)    ??? Bipolar disorder ([place])    ??? BV (bacterial vaginosis)     On Several Occasions   ??? Chlamydia 04/25/2011    07/07/14, 04/12/14, 02/14/14, 07/03/12, 04/25/11    ??? Depression    ??? Essential hypertension    ??? Gestational diabetes    ??? Gonorrhea 01/27/2013    07/07/14, 02/14/14, 01/27/13   ??? Headache 03/10/2014    CT Head W/O Contrast: Normal   ??? Hypokalemia 05/22/2011    3.4 mmol/L   ??? Hypomagnesemia 03/09/2014    1.5 mg/dL   ??? Hyponatremia 01/03/2011    132 mmol/L   ??? Hypothyroidism    ??? Microcytic anemia 01/01/2008   ??? Morbid obesity ([place])    ??? OCD (obsessive compulsive disorder)    ??? Recurrent UTI    ??? Sacroiliitis ([place]) 11/12/2011    Noted on Lumbar X-ray: Left Sided Sacroliitis    ???  Strep throat 05/22/2011    + RS   ??? Vision decreased      Past Surgical History:   Procedure Laterality Date   ??? HX DILATION AND CURETTAGE  09/2013       Social History     Social History     Socioeconomic History   ??? Marital status: SINGLE     Spouse name: Not on file   ??? Number of children: Not on file   ??? Years of education: Not on file   ??? Highest education level: Not on file   Occupational History   ??? Not on file   Social Needs   ??? Financial resource strain: Not on file   ??? Food insecurity:     Worry: Not on file     Inability: Not on file   ??? Transportation needs:     Medical: Not on file     Non-medical: Not on file   Tobacco Use   ??? Smoking status: Never Smoker   ??? Smokeless tobacco: Never Used   Substance and Sexual Activity   ??? Alcohol use: Yes   ??? Drug use: Yes     Types: Marijuana   ??? Sexual activity: Yes     Partners: Male     Birth control/protection: None   Lifestyle    ??? Physical activity:     Days per week: Not on file     Minutes per session: Not on file   ??? Stress: Not on file   Relationships   ??? Social connections:     Talks on phone: Not on file     Gets together: Not on file     Attends religious service: Not on file     Active member of club or organization: Not on file     Attends meetings of clubs or organizations: Not on file     Relationship status: Not on file   ??? Intimate partner violence:     Fear of current or ex partner: Not on file     Emotionally abused: Not on file     Physically abused: Not on file     Forced sexual activity: Not on file   Other Topics Concern   ??? Military Service Not Asked   ??? Blood Transfusions Not Asked   ??? Caffeine Concern Not Asked   ??? Occupational Exposure Not Asked   ??? Hobby Hazards Not Asked   ??? Sleep Concern Not Asked   ??? Stress Concern Not Asked   ??? Weight Concern Not Asked   ??? Special Diet Not Asked   ??? Back Care Not Asked   ??? Exercise Not Asked   ??? Bike Helmet Not Asked   ??? Seat Belt Not Asked   ??? Self-Exams Not Asked   Social History Narrative   ??? Not on file       Family History     Family History   Problem Relation Age of Onset   ??? Hypertension Maternal Grandmother    ??? Thyroid Disease Maternal Grandmother    ??? Diabetes Maternal Grandmother    ??? Hypertension Maternal Grandfather    ??? Hypertension Paternal Grandfather    ??? Hypertension Mother    ??? Asthma Mother    ??? Diabetes Mother    ??? Thyroid Disease Brother    ??? Diabetes Brother    ??? No Known Problems Sister    ??? Diabetes Father    ??? Diabetes Paternal Grandmother    ???  Thyroid Disease Maternal Aunt        Current Medications     Prior to Admission Medications   Prescriptions Last Dose Informant Patient Reported? Taking?   labetalol (NORMODYNE) 200 mg tablet   No No   Sig: Take 1 Tab by mouth every twelve (12) hours.   levothyroxine (SYNTHROID) 75 mcg tablet   No No   Sig: Take 1 Tab by mouth Daily (before breakfast). Indications: HYPOTHYROIDISM    Patient taking differently: Take 100 mcg by mouth Daily (before breakfast). Indications: hypothyroidism   promethazine (PHENERGAN) 25 mg tablet   No No   Sig: Take 1 Tab by mouth every eight (8) hours as needed for Nausea.      Facility-Administered Medications: None       Allergies     Allergies   Allergen Reactions   ??? Percocet [Oxycodone-Acetaminophen] Nausea and Vomiting       Physical Exam      ED Triage Vitals [11/27/17 1445]   ED Encounter Vitals Group      BP 165/82      Pulse (Heart Rate) (!) 101      Resp Rate 18      Temp 98.4 ??F (36.9 ??C)      Temp src       O2 Sat (%) 97 %      Weight 350 lb      Height '5\' 5"'$        Constitutional: Patient appears well developed and well nourished. Appearance and behavior are age and situation appropriate.   HEENT: Conjunctiva clear. EOMs intact.  PERRL. Mucous membranes moist, non-erythematous.  Uvula midline, tonsils are +4 and equal bilaterally without erythema or exudates noted.  No stridor noted.  Normal phonation.  Neck: supple, non tender, symmetrical, no masses.   Respiratory: lungs clear to auscultation, nonlabored respirations. No tachypnea or accessory muscle use.  Cardiovascular: heart regular rate and rhythm without murmur rubs or gallops.  +1 bilateral peripheral edema. Distal pulses intact +2 bilaterally.   Gastrointestinal:  Abdomen soft, bowel sounds present x4, nontender without complaint of pain to palpation  Musculoskeletal: Tenderness palpation in posterior calves bilaterally.  Distal pulses are intact +2 no joint erythema or edema. Nail beds pink with prompt capillary refill  Integumentary: warm and dry without rashes or lesions  Neurologic: alert and oriented.     Impression and Management Plan   23-week pregnant patient with multiple complaints including chest pain shortness of breath.  Recent hospitalization proxy 1 month ago.  Symptoms started 3 days ago.  Decreased fetal movement.  We will obtain a EKG, labs  to include CBC, BMP, d-dimer.  We also PVL lower extremities and CTA chest for evidence of pulmonary embolus.  Will also ultrasound abdomen to look for evidence of fetal heart rate.    Diagnostic Studies   Lab:   Recent Results (from the past 12 hour(s))   CBC WITH AUTOMATED DIFF    Collection Time: 11/27/17  4:15 PM   Result Value Ref Range    WBC 5.8 4.0 - 11.0 1000/mm3    RBC 4.14 3.60 - 5.20 M/uL    HGB 10.5 (L) 13.0 - 17.2 gm/dl    HCT 30.5 (L) 37.0 - 50.0 %    MCV 73.7 (L) 80.0 - 98.0 fL    MCH 25.4 25.4 - 34.6 pg    MCHC 34.4 30.0 - 36.0 gm/dl    PLATELET 214 140 - 450 1000/mm3    MPV 8.9  6.0 - 10.0 fL    RDW-SD 38.5 36.4 - 46.3      NRBC 0 0 - 0      IMMATURE GRANULOCYTES 0.3 0.0 - 3.0 %    NEUTROPHILS 72.8 (H) 34 - 64 %    LYMPHOCYTES 17.7 (L) 28 - 48 %    MONOCYTES 8.2 1 - 13 %    EOSINOPHILS 0.7 0 - 5 %    BASOPHILS 0.3 0 - 3 %   METABOLIC PANEL, BASIC    Collection Time: 11/27/17  4:15 PM   Result Value Ref Range    Sodium 137 136 - 145 mEq/L    Potassium 3.3 (L) 3.5 - 5.1 mEq/L    Chloride 107 98 - 107 mEq/L    CO2 23 21 - 32 mEq/L    Glucose 99 74 - 106 mg/dl    BUN 7 7 - 25 mg/dl    Creatinine 0.5 (L) 0.6 - 1.3 mg/dl    GFR est AA >60.0      GFR est non-AA >60      Calcium 8.9 8.5 - 10.1 mg/dl    Anion gap 7 5 - 15 mmol/L   TROPONIN I    Collection Time: 11/27/17  4:15 PM   Result Value Ref Range    Troponin-I <0.015 0.000 - 0.045 ng/ml   D DIMER    Collection Time: 11/27/17  4:15 PM   Result Value Ref Range    D DIMER 0.56 (H) 0.01 - 0.50 ug/mL (FEU)   HEPATIC FUNCTION PANEL    Collection Time: 11/27/17  4:15 PM   Result Value Ref Range    AST (SGOT) 47 (H) 15 - 37 U/L    ALT (SGPT) 50 12 - 78 U/L    Alk. phosphatase 53 45 - 117 U/L    Bilirubin, total 0.3 0.2 - 1.0 mg/dl    Protein, total 6.6 6.4 - 8.2 gm/dl    Albumin 2.9 (L) 3.4 - 5.0 gm/dl    Bilirubin, direct 0.1 0.0 - 0.2 mg/dl   STREP GRP A BY PCR    Collection Time: 11/27/17  6:00 PM   Result Value Ref Range     Group A strep by PCR  Negative - No Streptococcus Group A DNA was Detected.       Negative - No Streptococcus Group A DNA was Detected.   POC URINE MACROSCOPIC    Collection Time: 11/27/17  9:34 PM   Result Value Ref Range    Glucose Negative NEGATIVE,Negative mg/dl    Bilirubin Negative NEGATIVE,Negative      Ketone >=160 (A) NEGATIVE,Negative mg/dl    Specific gravity 1.015 1.005 - 1.030      Blood Trace-intact (A) NEGATIVE,Negative      pH (UA) 6.5 5 - 9      Protein 30 (A) NEGATIVE,Negative mg/dl    Urobilinogen 1.0 0.0 - 1.0 EU/dl    Nitrites Positive (A) NEGATIVE,Negative      Leukocyte Esterase Negative NEGATIVE,Negative      Color Yellow      Appearance Clear     POC URINE MICROSCOPIC    Collection Time: 11/27/17  9:34 PM   Result Value Ref Range    Epithelial cells, squamous 15-29 /LPF    WBC OCCASIONAL /HPF    RBC OCCASIONAL /HPF    Bacteria OCCASIONAL /HPF     Labs Reviewed   CBC WITH AUTOMATED DIFF - Abnormal; Notable for the following components:  Result Value    HGB 10.5 (*)     HCT 30.5 (*)     MCV 73.7 (*)     NEUTROPHILS 72.8 (*)     LYMPHOCYTES 17.7 (*)     All other components within normal limits   METABOLIC PANEL, BASIC - Abnormal; Notable for the following components:    Potassium 3.3 (*)     Creatinine 0.5 (*)     All other components within normal limits   D DIMER - Abnormal; Notable for the following components:    D DIMER 0.56 (*)     All other components within normal limits   HEPATIC FUNCTION PANEL - Abnormal; Notable for the following components:    AST (SGOT) 47 (*)     Albumin 2.9 (*)     All other components within normal limits   POC URINE MACROSCOPIC - Abnormal; Notable for the following components:    Ketone >=160 (*)     Blood Trace-intact (*)     Protein 30 (*)     Nitrites Positive (*)     All other components within normal limits   STREP GRP A BY PCR   TROPONIN I   POC URINE MICROSCOPIC       Imaging:    Cta Chest W Or W Wo Cont    Result Date: 11/27/2017   EXAM: CTA CHEST W OR W WO CONT INDICATION: chest pain, SOB.    TECHNIQUE: CTA scan of the chest with IV contrast including with 3D MIP Images including coronal and sagittal images. DICOM format image data is available to non-affiliated external healthcare facilities or entities on a secure, media free, reciprocally searchable basis with patient authorization for 12 months following the date of the study. COMPARISON: No relevant prior studies available. FINDINGS: Pulmonary arteries: Unremarkable. No pulmonary embolism. Aorta: No acute findings. No thoracic aortic aneurysm or dissection. Lungs: The lungs are clear and well expanded. No infiltrates or nodules or masses. Lymph nodes: Unremarkable. No enlarged lymph nodes. Esophagus: Normal Pleural spaces: Unremarkable. No significant effusion. No pneumothorax. Heart: Unremarkable. No cardiomegaly. No significant pericardial effusion. No evidence of right ventricular dysfunction. Thyroid: Normal Chest wall: No abnormal findings. Bones: No fractures identified. Upper abdomen: Normal     IMPRESSION: No evidence of pulmonary embolism. Negative chest CT.         ED Course/ Medical Decision Making   Patient remained stable in the emergency department.  Labs showed no evidence of systemic infection, she is a chronic stable anemia and no significant electrolyte abnormalities.  She did have a slightly elevated d-dimer of 0.56.  A CTA of the chest was obtained that did not show any evidence of pulmonary embolus.  Liver function tests were unremarkable.  Urine did show evidence of infection with positive nitrates.  It also was positive for protein.  Patient does have pregnancy related hypertension and is supposed to be taking labetalol which she has not been taking for at least 1 month after she fired her OB/GYN, Dr. Mable Paris.  PVL of her lower extremities were negative for DVT.  I personally was able to identify the patient's intrauterine pregnancy  with the ultrasound there was a heartbeat of 142 bpm there was active fetal movement.  Patient's shortness of breath significantly improved after the administration of a DuoNeb nebulizer.  We discussed results with patient.  We have also consulted with on-call OB/GYN who recommends follow-up in his office and prescription for her previously prescribed labetalol.  Patient is agreeable with this plan.  Return to the ER for worsening of symptoms.  Medications   sodium chloride 0.9 % bolus infusion 1,000 mL (0 mL IntraVENous IV Completed 11/27/17 2125)   ondansetron (ZOFRAN) injection 4 mg (4 mg IntraVENous Given 11/27/17 1906)   albuterol-ipratropium (DUO-NEB) 2.5 MG-0.5 MG/3 ML (3 mL Nebulization Given 11/27/17 1828)   iopamidol (ISOVUE-370) 76 % injection 80 mL (80 mL IntraVENous Given 11/27/17 1957)         Final Diagnosis       ICD-10-CM ICD-9-CM   1. SOB (shortness of breath) R06.02 786.05   2. Hypertension during pregnancy in second trimester, unspecified hypertension in pregnancy type O16.2 642.93         Disposition   Discharge home  Discharge Medication List as of 11/27/2017 10:25 PM      START taking these medications    Details   albuterol (PROVENTIL HFA, VENTOLIN HFA, PROAIR HFA) 90 mcg/actuation inhaler Take 2 Puffs by inhalation every four (4) hours as needed for Wheezing., Print, Disp-1 Inhaler, R-1      !! labetalol (NORMODYNE) 200 mg tablet Take 1 Tab by mouth two (2) times a day., Normal, Disp-30 Tab, R-0       !! - Potential duplicate medications found. Please discuss with provider.      CONTINUE these medications which have NOT CHANGED    Details   !! labetalol (NORMODYNE) 200 mg tablet Take 1 Tab by mouth every twelve (12) hours., Normal, Disp-60 Tab, R-0      promethazine (PHENERGAN) 25 mg tablet Take 1 Tab by mouth every eight (8) hours as needed for Nausea., Normal, Disp-20 Tab, R-0      levothyroxine (SYNTHROID) 75 mcg tablet Take 1 Tab by mouth Daily (before  breakfast). Indications: HYPOTHYROIDISM, Normal, Disp-30 Tab, R-2       !! - Potential duplicate medications found. Please discuss with provider.          The patient was personally evaluated by myself and discussed with [facility], EMILY A, MD who agrees with the above assessment and plan.    Finis Bud, NP-C  November 28, 2017    My signature above authenticates this document and my orders, the final    diagnosis (es), discharge prescription (s), and instructions in the Epic    record.  If you have any questions please contact [phone].     Nursing notes have been reviewed by the physician/ advanced practice    Clinician.    Dragon medical dictation software was used for portions of this report. Unintended voice recognition errors may occur.

## 2017-11-27 NOTE — ED Notes (Signed)
Pt resting on bed, NAD.  IV fluids infusing, pt medicated per providers orders.  Currently waiting for CT prior to final disposition.  Pt not happy with length of time "wasted" during this visit.  Plan of care again discussed with patient who again verbalizes understanding.

## 2017-11-27 NOTE — ED Notes (Signed)
Pt requests another breathing treatment and a prescription for a pump.  When questioned, it was determined pt was requesting an inhaler to go home with. NAD

## 2017-11-27 NOTE — ED Notes (Signed)
Discharge instructions, prescriptions, and work excuse to patient at nurse's station.  Pt verbalizes understanding and ambulated with all personal belongings to waiting room.

## 2017-11-27 NOTE — ED Notes (Signed)
Pt pulled out IV, states "I do not want anything else, I am going to leave".

## 2017-11-27 NOTE — ED Notes (Signed)
Pt reports multiple issues at this time.  Pt endorses firing her OB/GYN because they are "too slow".  Pt also states she has a dry cough and when she coughs she feels like her "chest is caving in".  Also, when she coughs her stomach muscle tightens up. Also, the #18 in her right a/c hurts, "I am in the hospital all the time, it never feels like this." Finally, pt denies bleeding or water discharge from vaginal area. NAD.  Offered to remove IV from right a/c, pt declines at this time, instead requests a third warm blanket.

## 2017-11-27 NOTE — ED Notes (Signed)
Pt is currently in CT.

## 2017-11-27 NOTE — Progress Notes (Signed)
Problem: Gas Exchange - Impaired  Goal: *Absence of hypoxia  Outcome: Progressing Towards Goal     Problem: Patient Education: Go to Patient Education Activity  Goal: Patient/Family Education  Outcome: Progressing Towards Goal

## 2017-11-27 NOTE — Progress Notes (Signed)
Problem: Gas Exchange - Impaired  Goal: *Absence of hypoxia  Outcome: Progressing Towards Goal     Problem: Patient Education: Go to Patient Education Activity  Goal: Patient/Family Education  Outcome: Progressing Towards Goal

## 2017-11-27 NOTE — ED Notes (Signed)
Medic in room, pt states "If that nurse comes back in here again, I will lay hands upon her and do harm".  Medic relayed message to this nurse who in turn relayed message to security, NP, and ED provider.  Security and NP at bedside.

## 2017-11-27 NOTE — ED Notes (Signed)
Discharge instructions, prescriptions, and work excuse to patient at nurse's station.  Pt verbalizes understanding and ambulated with all personal belongings to waiting room.

## 2017-11-27 NOTE — ED Notes (Signed)
 Pt pulled out IV, states I do not want anything else, I am going to leave.

## 2017-11-27 NOTE — ED Notes (Signed)
Pt requests another breathing treatment and a prescription for a pump.  When questioned, it was determined pt was requesting an inhaler to go home with. NAD

## 2017-11-27 NOTE — ED Notes (Signed)
Pt states for past 3 days she has been having chest pain with shortness of breath feeling like her chest is caving in, also having abdominal pain with it, is [redacted] weeks pregnant

## 2017-11-27 NOTE — Progress Notes (Signed)
 prelim code r note    Last admission date 11/06/17---11/07/17    reason for last admission Pregnancy    MD [facility]    disposition at discharge Home    Insurance [facility]

## 2017-11-27 NOTE — ED Notes (Signed)
Pt resting on bed, NAD.  IV fluids infusing, pt medicated per providers orders.  Currently waiting for CT prior to final disposition.  Pt not happy with length of time "wasted" during this visit.  Plan of care again discussed with patient who again verbalizes understanding.

## 2017-11-27 NOTE — ED Notes (Signed)
 Pt reports multiple issues at this time.  Pt endorses firing her OB/GYN because they are too slow.  Pt also states she has a dry cough and when she coughs she feels like her chest is caving in.  Also, when she coughs her stomach muscle tightens up. Also, the #18 in her right a/c hurts, I am in the hospital all the time, it never feels like this. Finally, pt denies bleeding or water discharge from vaginal area. NAD.  Offered to remove IV from right a/c, pt declines at this time, instead requests a third warm blanket.

## 2017-11-27 NOTE — ED Notes (Signed)
Pt provided with blankets, requests something to eat or drink.  Advised of NPO status until cleared by ED providers.

## 2017-11-27 NOTE — Progress Notes (Signed)
Peripheral Vascular Laboratory    Bilateral lower extremity venous exam completed.    Negative for deep and superficial thrombosis in the lower extremities bilaterally..    Final report to follow.  Francee Piccolo, RVS

## 2017-11-27 NOTE — ED Provider Notes (Signed)
ED Provider Notes by Finis Bud, NP at 11/27/17 1754                Author: Finis Bud, NP  Service: --  Author Type: Nurse Practitioner       Filed: 11/28/17 0319  Date of Service: 11/27/17 1754  Status: Attested           Editor: Finis Bud, NP (Nurse Practitioner)  Cosigner: Minna Merritts, MD at 11/28/17 2257          Attestation signed by Minna Merritts, MD at 11/28/17 2257          I interviewed and examined the patient. I discussed with the mid-level provider and agree with their evaluation and plan as documented here.    27 y.o.female approximately [redacted] weeks gestation presents to the ED with pain and shortness of breath.  She tells me that she has coughed up a [place] of blood.  Is a minimal amount of wheezing on exam.  Today CT of the chest is negative for pulmonary  embolism.  PVLs are negative for DVT.  She is very hypertensive.  She is apparently recently fired her OB/GYN, so she is not taking her labetalol.  LFTs are unremarkable.  She is spilling a lot of protein in her urine.  She was discussed with Dr. Owens Shark by Finis Bud, NP.  He recommends restarting labetalol having the patient follow-up in his clinic.  Patient is discharged home in stable condition.                                       [facility]   Emergency Department Treatment Report             Patient: Linda [name]  Age: 27 y.o.  Sex: female          Date of Birth: 1990/04/14  Admit Date: 11/27/2017  PCP: None     MRN: [id number]   CSN: [id number]            Room: OTF/OTF  Time Dictated: 5:54 PM          Attending MD: Minna Merritts, MD   APC:  Finis Bud, NP-C      Chief Complaint      Chief Complaint       Patient presents with        ?  Chest Pain     ?  Abdominal Pain        ?  Shortness of Breath             History of Present Illness     27 y.o. female  who is a G5, P4, [redacted] weeks pregnant presenting with 3 days of shortness of breath, chest tightness,  dry cough.  She states that her symptoms have been getting progressively worse over the past 3 days.  She describes of breath chest pain as a tightness  sensation that is worse when she takes a deep breath in.  She is not having any nasal discharge or drainage.  She also having a sore throat that is not causing her to have any difficulty swallowing or change in phonation.  States she is also having nausea  and vomiting, she was admitted for the symptoms in August and was given a prescription for Phenergan which she has now  ran out of it.  She states she is been unable to get a refill of this prescription because she recently fired her OB/GYN, Dr. Mable Paris.  She also states that she has not felt her baby move since she was discharged from the hospital approximately 1 month ago.  She has not followed up for this.  States that she is also supposed to be taking labetalol for hypertension during her  pregnancy but she also has been out of this medication as well.  She does not have a history of asthma.        Review of Systems     Constitutional: No fever or chills   Eyes: No visual symptoms, eye pain or redness.    ENT: As above   Neck: No pain, stiffness, or swollen glands.    Respiratory: As above   Cardiovascular: As above   Gastrointestinal: As above    genitourinary: No dysuria, frequency, or urgency.   Genital: No discharge or bleeding.    Musculoskeletal: Lower extremity tenderness to palpation.   Integumentary: No rashes or wounds.   Neurological: No headaches or sensory motor symptoms      Denies complaints in all other systems.        Past Medical/Surgical History          Past Medical History:        Diagnosis  Date         ?  ADHD (attention deficit hyperactivity disorder)       ?  Bipolar disorder ([place])       ?  BV (bacterial vaginosis)            On Several Occasions         ?  Chlamydia  04/25/2011          07/07/14, 04/12/14, 02/14/14, 07/03/12, 04/25/11          ?  Depression       ?  Essential  hypertension       ?  Gestational diabetes       ?  Gonorrhea  01/27/2013          07/07/14, 02/14/14, 01/27/13         ?  Headache  03/10/2014          CT Head W/O Contrast: Normal         ?  Hypokalemia  05/22/2011          3.4 mmol/L         ?  Hypomagnesemia  03/09/2014          1.5 mg/dL         ?  Hyponatremia  01/03/2011          132 mmol/L         ?  Hypothyroidism       ?  Microcytic anemia  01/01/2008     ?  Morbid obesity ([place])       ?  OCD (obsessive compulsive disorder)       ?  Recurrent UTI       ?  Sacroiliitis ([place])  11/12/2011          Noted on Lumbar X-ray: Left Sided Sacroliitis          ?  Strep throat  05/22/2011          + RS         ?  Vision decreased  Past Surgical History:         Procedure  Laterality  Date          ?  HX DILATION AND CURETTAGE    09/2013             Social History          Social History          Socioeconomic History         ?  Marital status:  SINGLE              Spouse name:  Not on file         ?  Number of children:  Not on file     ?  Years of education:  Not on file     ?  Highest education level:  Not on file       Occupational History        ?  Not on file       Social Needs         ?  Financial resource strain:  Not on file        ?  Food insecurity:              Worry:  Not on file         Inability:  Not on file        ?  Transportation needs:              Medical:  Not on file         Non-medical:  Not on file       Tobacco Use         ?  Smoking status:  Never Smoker     ?  Smokeless tobacco:  Never Used       Substance and Sexual Activity         ?  Alcohol use:  Yes     ?  Drug use:  Yes              Types:  Marijuana         ?  Sexual activity:  Yes              Partners:  Male         Birth control/protection:  None       Lifestyle        ?  Physical activity:              Days per week:  Not on file         Minutes per session:  Not on file         ?  Stress:  Not on file       Relationships        ?  Social connections:              Talks on  phone:  Not on file         Gets together:  Not on file         Attends religious service:  Not on file         Active member of club or organization:  Not on file         Attends meetings of clubs or organizations:  Not on file         Relationship status:  Not on file        ?  Intimate partner violence:  Fear of current or ex partner:  Not on file         Emotionally abused:  Not on file         Physically abused:  Not on file         Forced sexual activity:  Not on file        Other Topics  Concern         ?  Military Service  Not Asked     ?  Blood Transfusions  Not Asked     ?  Caffeine Concern  Not Asked     ?  Occupational Exposure  Not Asked     ?  Hobby Hazards  Not Asked     ?  Sleep Concern  Not Asked     ?  Stress Concern  Not Asked     ?  Weight Concern  Not Asked     ?  Special Diet  Not Asked     ?  Back Care  Not Asked     ?  Exercise  Not Asked     ?  Bike Helmet  Not Asked     ?  Seat Belt  Not Asked     ?  Self-Exams  Not Asked       Social History Narrative        ?  Not on file             Family History          Family History         Problem  Relation  Age of Onset          ?  Hypertension  Maternal Grandmother       ?  Thyroid Disease  Maternal Grandmother       ?  Diabetes  Maternal Grandmother       ?  Hypertension  Maternal Grandfather       ?  Hypertension  Paternal Grandfather       ?  Hypertension  Mother       ?  Asthma  Mother       ?  Diabetes  Mother       ?  Thyroid Disease  Brother       ?  Diabetes  Brother       ?  No Known Problems  Sister       ?  Diabetes  Father       ?  Diabetes  Paternal Grandmother            ?  Thyroid Disease  Maternal Aunt               Current Medications          Prior to Admission Medications     Prescriptions  Last Dose  Informant  Patient Reported?  Taking?      labetalol (NORMODYNE) 200 mg tablet      No  No      Sig: Take 1 Tab by mouth every twelve (12) hours.      levothyroxine (SYNTHROID) 75 mcg tablet      No  No      Sig: Take  1 Tab by mouth Daily (before breakfast). Indications: HYPOTHYROIDISM      Patient taking differently: Take 100 mcg by mouth Daily (before breakfast). Indications: hypothyroidism      promethazine (PHENERGAN) 25 mg tablet  No  No      Sig: Take 1 Tab by mouth every eight (8) hours as needed for Nausea.               Facility-Administered Medications: None             Allergies          Allergies        Allergen  Reactions         ?  Percocet [Oxycodone-Acetaminophen]  Nausea and Vomiting             Physical Exam           ED Triage Vitals [11/27/17 1445]     ED Encounter Vitals Group           BP  165/82        Pulse (Heart Rate)  (!) 101        Resp Rate  18        Temp  98.4 ??F (36.9 ??C)        Temp src          O2 Sat (%)  97 %        Weight  350 lb           Height  '5\' 5"'$            Constitutional: Patient appears well developed and well nourished. Appearance and behavior are age and situation appropriate.    HEENT: Conjunctiva clear. EOMs intact.  PERRL. Mucous membranes moist, non-erythematous.  Uvula midline, tonsils are +4 and equal bilaterally  without erythema or exudates noted.  No stridor noted.  Normal phonation.   Neck: supple, non tender, symmetrical, no masses.    Respiratory: lungs clear to auscultation, nonlabored respirations. No tachypnea or accessory muscle use.   Cardiovascular: heart regular rate and rhythm without murmur rubs or gallops.  +1 bilateral peripheral edema. Distal pulses intact +2 bilaterally.    Gastrointestinal:  Abdomen soft, bowel sounds present x4, nontender without complaint of pain to palpation   Musculoskeletal: Tenderness palpation in posterior calves bilaterally.  Distal pulses are intact +2 no joint erythema or edema. Nail beds pink  with prompt capillary refill   Integumentary: warm and dry without rashes or lesions   Neurologic: alert and oriented.         Impression and Management Plan     23-week pregnant patient with multiple complaints including chest pain  shortness of breath.  Recent hospitalization proxy 1 month ago.  Symptoms started 3 days ago.  Decreased fetal  movement.  We will obtain a EKG, labs to include CBC, BMP, d-dimer.  We also PVL lower extremities and CTA chest for evidence of pulmonary embolus.  Will also ultrasound abdomen to look for evidence of fetal heart rate.        Diagnostic Studies     Lab:      Recent Results (from the past 12 hour(s))     CBC WITH AUTOMATED DIFF          Collection Time: 11/27/17  4:15 PM         Result  Value  Ref Range            WBC  5.8  4.0 - 11.0 1000/mm3       RBC  4.14  3.60 - 5.20 M/uL       HGB  10.5 (L)  13.0 - 17.2 gm/dl       HCT  30.5 (L)  37.0 - 50.0 %       MCV  73.7 (L)  80.0 - 98.0 fL       MCH  25.4  25.4 - 34.6 pg       MCHC  34.4  30.0 - 36.0 gm/dl       PLATELET  214  140 - 450 1000/mm3       MPV  8.9  6.0 - 10.0 fL       RDW-SD  38.5  36.4 - 46.3         NRBC  0  0 - 0         IMMATURE GRANULOCYTES  0.3  0.0 - 3.0 %       NEUTROPHILS  72.8 (H)  34 - 64 %       LYMPHOCYTES  17.7 (L)  28 - 48 %       MONOCYTES  8.2  1 - 13 %       EOSINOPHILS  0.7  0 - 5 %       BASOPHILS  0.3  0 - 3 %       METABOLIC PANEL, BASIC          Collection Time: 11/27/17  4:15 PM         Result  Value  Ref Range            Sodium  137  136 - 145 mEq/L       Potassium  3.3 (L)  3.5 - 5.1 mEq/L       Chloride  107  98 - 107 mEq/L       CO2  23  21 - 32 mEq/L       Glucose  99  74 - 106 mg/dl       BUN  7  7 - 25 mg/dl       Creatinine  0.5 (L)  0.6 - 1.3 mg/dl       GFR est AA  >60.0          GFR est non-AA  >60          Calcium  8.9  8.5 - 10.1 mg/dl       Anion gap  7  5 - 15 mmol/L       TROPONIN I          Collection Time: 11/27/17  4:15 PM         Result  Value  Ref Range            Troponin-I  <0.015  0.000 - 0.045 ng/ml       D DIMER          Collection Time: 11/27/17  4:15 PM         Result  Value  Ref Range            D DIMER  0.56 (H)  0.01 - 0.50 ug/mL (FEU)       HEPATIC FUNCTION PANEL          Collection Time:  11/27/17  4:15 PM         Result  Value  Ref Range            AST (SGOT)  47 (H)  15 - 37 U/L       ALT (SGPT)  50  12 - 78 U/L       Alk. phosphatase  53  45 - 117 U/L  Bilirubin, total  0.3  0.2 - 1.0 mg/dl       Protein, total  6.6  6.4 - 8.2 gm/dl       Albumin  2.9 (L)  3.4 - 5.0 gm/dl       Bilirubin, direct  0.1  0.0 - 0.2 mg/dl       STREP GRP A BY PCR          Collection Time: 11/27/17  6:00 PM         Result  Value  Ref Range            Group A strep by PCR    Negative - No Streptococcus Group A DNA was Detected.               Negative - No Streptococcus Group A DNA was Detected.       POC URINE MACROSCOPIC          Collection Time: 11/27/17  9:34 PM         Result  Value  Ref Range            Glucose  Negative  NEGATIVE,Negative mg/dl       Bilirubin  Negative  NEGATIVE,Negative         Ketone  >=160 (A)  NEGATIVE,Negative mg/dl       Specific gravity  1.015  1.005 - 1.030         Blood  Trace-intact (A)  NEGATIVE,Negative         pH (UA)  6.5  5 - 9         Protein  30 (A)  NEGATIVE,Negative mg/dl       Urobilinogen  1.0  0.0 - 1.0 EU/dl       Nitrites  Positive (A)  NEGATIVE,Negative         Leukocyte Esterase  Negative  NEGATIVE,Negative         Color  Yellow          Appearance  Clear          POC URINE MICROSCOPIC          Collection Time: 11/27/17  9:34 PM         Result  Value  Ref Range            Epithelial cells, squamous  15-29  /LPF       WBC  OCCASIONAL  /HPF       RBC  OCCASIONAL  /HPF            Bacteria  OCCASIONAL  /HPF          Labs Reviewed       CBC WITH AUTOMATED DIFF - Abnormal; Notable for the following components:            Result  Value            HGB  10.5 (*)         HCT  30.5 (*)         MCV  73.7 (*)         NEUTROPHILS  72.8 (*)         LYMPHOCYTES  17.7 (*)            All other components within normal limits       METABOLIC PANEL, BASIC - Abnormal; Notable for the following components:            Potassium  3.3 (*)  Creatinine  0.5 (*)            All other  components within normal limits       D DIMER - Abnormal; Notable for the following components:            D DIMER  0.56 (*)            All other components within normal limits       HEPATIC FUNCTION PANEL - Abnormal; Notable for the following components:            AST (SGOT)  47 (*)         Albumin  2.9 (*)            All other components within normal limits       POC URINE MACROSCOPIC - Abnormal; Notable for the following components:            Ketone  >=160 (*)         Blood  Trace-intact (*)         Protein  30 (*)         Nitrites  Positive (*)            All other components within normal limits       STREP GRP A BY PCR     TROPONIN I       POC URINE MICROSCOPIC           Imaging:     Cta Chest W Or W Wo Cont      Result Date: 11/27/2017   EXAM: CTA CHEST W OR W WO CONT INDICATION: chest pain, SOB.    TECHNIQUE: CTA scan of the chest with IV contrast including with 3D MIP Images including coronal and sagittal images. DICOM format image data is available to non-affiliated external healthcare  facilities or entities on a secure, media free, reciprocally searchable basis with patient authorization for 12 months following the date of the study. COMPARISON: No relevant prior studies available. FINDINGS: Pulmonary arteries: Unremarkable. No pulmonary  embolism. Aorta: No acute findings. No thoracic aortic aneurysm or dissection. Lungs: The lungs are clear and well expanded. No infiltrates or nodules or masses. Lymph nodes: Unremarkable. No enlarged lymph nodes. Esophagus: Normal Pleural spaces: Unremarkable.  No significant effusion. No pneumothorax. Heart: Unremarkable. No cardiomegaly. No significant pericardial effusion. No evidence of right ventricular dysfunction. Thyroid: Normal Chest wall: No abnormal findings. Bones: No fractures identified. Upper  abdomen: Normal       IMPRESSION: No evidence of pulmonary embolism. Negative chest CT.               ED Course/ Medical Decision Making     Patient remained  stable in the emergency department.  Labs showed no evidence of systemic infection, she is a chronic stable anemia and no significant electrolyte abnormalities.   She did have a slightly elevated d-dimer of 0.56.  A CTA of the chest was obtained that did not show any evidence of pulmonary embolus.  Liver function tests were unremarkable.   Urine did show evidence of infection with positive nitrates.  It also was positive for protein.  Patient does have pregnancy related hypertension and is supposed to be taking labetalol which she has not been taking for at least 1 month after she fired  her OB/GYN, Dr. Mable Paris.   PVL of her lower extremities were negative for DVT.   I personally was able to identify the patient's intrauterine  pregnancy with the ultrasound there was a heartbeat of 142 bpm there was active fetal movement.   Patient's shortness of breath significantly improved after the administration of a DuoNeb nebulizer.   We discussed results with patient.  We have also consulted with on-call OB/GYN who recommends follow-up in his office and prescription for her previously prescribed labetalol.  Patient is agreeable with this plan.  Return to the ER for worsening of symptoms.     Medications       sodium chloride 0.9 % bolus infusion 1,000 mL (0 mL IntraVENous IV Completed 11/27/17 2125)     ondansetron (ZOFRAN) injection 4 mg (4 mg IntraVENous Given 11/27/17 1906)     albuterol-ipratropium (DUO-NEB) 2.5 MG-0.5 MG/3 ML (3 mL Nebulization Given 11/27/17 1828)       iopamidol (ISOVUE-370) 76 % injection 80 mL (80 mL IntraVENous Given 11/27/17 1957)                Final Diagnosis                 ICD-10-CM  ICD-9-CM          1.  SOB (shortness of breath)  R06.02  786.05          2.  Hypertension during pregnancy in second trimester, unspecified hypertension in pregnancy type  O16.2  642.93                Disposition     Discharge home     Discharge Medication List as of 11/27/2017 10:25 PM              START  taking these medications          Details        albuterol (PROVENTIL HFA, VENTOLIN HFA, PROAIR HFA) 90 mcg/actuation inhaler  Take 2 Puffs by inhalation every four (4) hours as needed for Wheezing., Print, Disp-1 Inhaler, R-1               !! labetalol (NORMODYNE) 200 mg tablet  Take 1 Tab by mouth two (2) times a day., Normal, Disp-30 Tab, R-0               !! - Potential duplicate medications found. Please discuss with provider.              CONTINUE these medications which have NOT CHANGED          Details        !! labetalol (NORMODYNE) 200 mg tablet  Take 1 Tab by mouth every twelve (12) hours., Normal, Disp-60 Tab, R-0               promethazine (PHENERGAN) 25 mg tablet  Take 1 Tab by mouth every eight (8) hours as needed for Nausea., Normal, Disp-20 Tab, R-0               levothyroxine (SYNTHROID) 75 mcg tablet  Take 1 Tab by mouth Daily (before breakfast). Indications: HYPOTHYROIDISM, Normal, Disp-30 Tab, R-2               !! - Potential duplicate medications found. Please discuss with provider.                  The patient was personally evaluated by myself and discussed with [facility], EMILY A, MD who agrees with the above assessment and plan.      Finis Bud, NP-C   November 28, 2017      My signature  above authenticates this document and my orders, the final     diagnosis (es), discharge prescription (s), and instructions in the Epic     record.   If you have any questions please contact [phone].       Nursing notes have been reviewed by the physician/ advanced practice     Clinician.      Dragon medical dictation software was used for portions of this report. Unintended voice recognition errors may occur.

## 2017-11-28 LAB — POC URINE MACROSCOPIC
Bilirubin, Urine: NEGATIVE
Bilirubin: NEGATIVE
Glucose, Ur: NEGATIVE mg/dl
Glucose: NEGATIVE mg/dl
Ketone: >=160 mg/dl — AB
Ketones, Urine: >=160 mg/dl — AB
Leukocyte Esterase, Urine: NEGATIVE
Leukocyte Esterase: NEGATIVE
Nitrite, Urine: POSITIVE — AB
Nitrites: POSITIVE — AB
Protein, UA: 30 mg/dl — AB
Protein: 30 mg/dl — AB
Specific Gravity, UA: 1.015 (ref 1.005–1.030)
Specific gravity: 1.015 (ref 1.005–1.030)
Urobilinogen, UA, POCT: 1 EU/dl (ref 0.0–1.0)
Urobilinogen: 1 EU/dl (ref 0.0–1.0)
pH (UA): 6.5 (ref 5–9)
pH, UA: 6.5 (ref 5–9)

## 2017-11-28 LAB — HEPATIC FUNCTION PANEL
ALT (SGPT): 50 U/L (ref 12–78)
ALT: 50 U/L (ref 12–78)
AST (SGOT): 47 U/L — ABNORMAL HIGH (ref 15–37)
AST: 47 U/L — ABNORMAL HIGH (ref 15–37)
Albumin: 2.9 gm/dl — ABNORMAL LOW (ref 3.4–5.0)
Albumin: 2.9 gm/dl — ABNORMAL LOW (ref 3.4–5.0)
Alk. phosphatase: 53 U/L (ref 45–117)
Alkaline Phosphatase: 53 U/L (ref 45–117)
Bilirubin, Direct: 0.1 mg/dl (ref 0.0–0.2)
Bilirubin, direct: 0.1 mg/dl (ref 0.0–0.2)
Bilirubin, total: 0.3 mg/dl (ref 0.2–1.0)
Protein, total: 6.6 gm/dl (ref 6.4–8.2)
Total Bilirubin: 0.3 mg/dl (ref 0.2–1.0)
Total Protein: 6.6 gm/dl (ref 6.4–8.2)

## 2017-11-28 LAB — STREP GRP A BY PCR
Group A strep by PCR: NEGATIVE
STREP GROUP A,PCR: NEGATIVE

## 2017-11-28 LAB — POC URINE MICROSCOPIC

## 2017-11-28 MED ORDER — LABETALOL 100 MG TAB
100 mg | Freq: Two times a day (BID) | ORAL | Status: DC
Start: 2017-11-28 — End: 2017-11-28

## 2017-11-28 MED ORDER — CEPHALEXIN 500 MG CAP
500 mg | ORAL_CAPSULE | Freq: Two times a day (BID) | ORAL | 0 refills | Status: AC
Start: 2017-11-28 — End: 2017-12-04

## 2017-11-28 MED ORDER — LABETALOL 200 MG TAB
200 mg | ORAL_TABLET | Freq: Two times a day (BID) | ORAL | 0 refills | Status: DC
Start: 2017-11-28 — End: 2017-11-27

## 2017-11-28 MED ORDER — CEPHALEXIN 500 MG CAP
500 mg | ORAL_CAPSULE | Freq: Two times a day (BID) | ORAL | 0 refills | Status: DC
Start: 2017-11-28 — End: 2017-11-27

## 2017-11-28 MED ORDER — IPRATROPIUM-ALBUTEROL 2.5 MG-0.5 MG/3 ML NEB SOLUTION
2.5 mg-0.5 mg/3 ml | RESPIRATORY_TRACT | Status: DC
Start: 2017-11-28 — End: 2017-11-28

## 2017-11-28 MED ORDER — LABETALOL 200 MG TAB
200 mg | ORAL_TABLET | Freq: Two times a day (BID) | ORAL | 0 refills | Status: DC
Start: 2017-11-28 — End: 2017-12-12

## 2017-11-28 MED ORDER — ALBUTEROL SULFATE HFA 90 MCG/ACTUATION AEROSOL INHALER
90 mcg/actuation | RESPIRATORY_TRACT | 1 refills | Status: AC | PRN
Start: 2017-11-28 — End: ?

## 2017-11-28 MED FILL — LABETALOL 100 MG TAB: 100 mg | ORAL | Qty: 2

## 2017-11-30 IMAGING — DX DG RIBS W/ CHEST 3+V*R*
3 series · 3 of 3 positions shown · non-contrast
Comparison: Chest radiographs dated 05/04/2015.

CLINICAL DATA: Right lateral posterior rib pain following a
twisting injury 2 weeks ago.

EXAM:
RIGHT RIBS AND CHEST - 3+ VIEW

[chest pa]
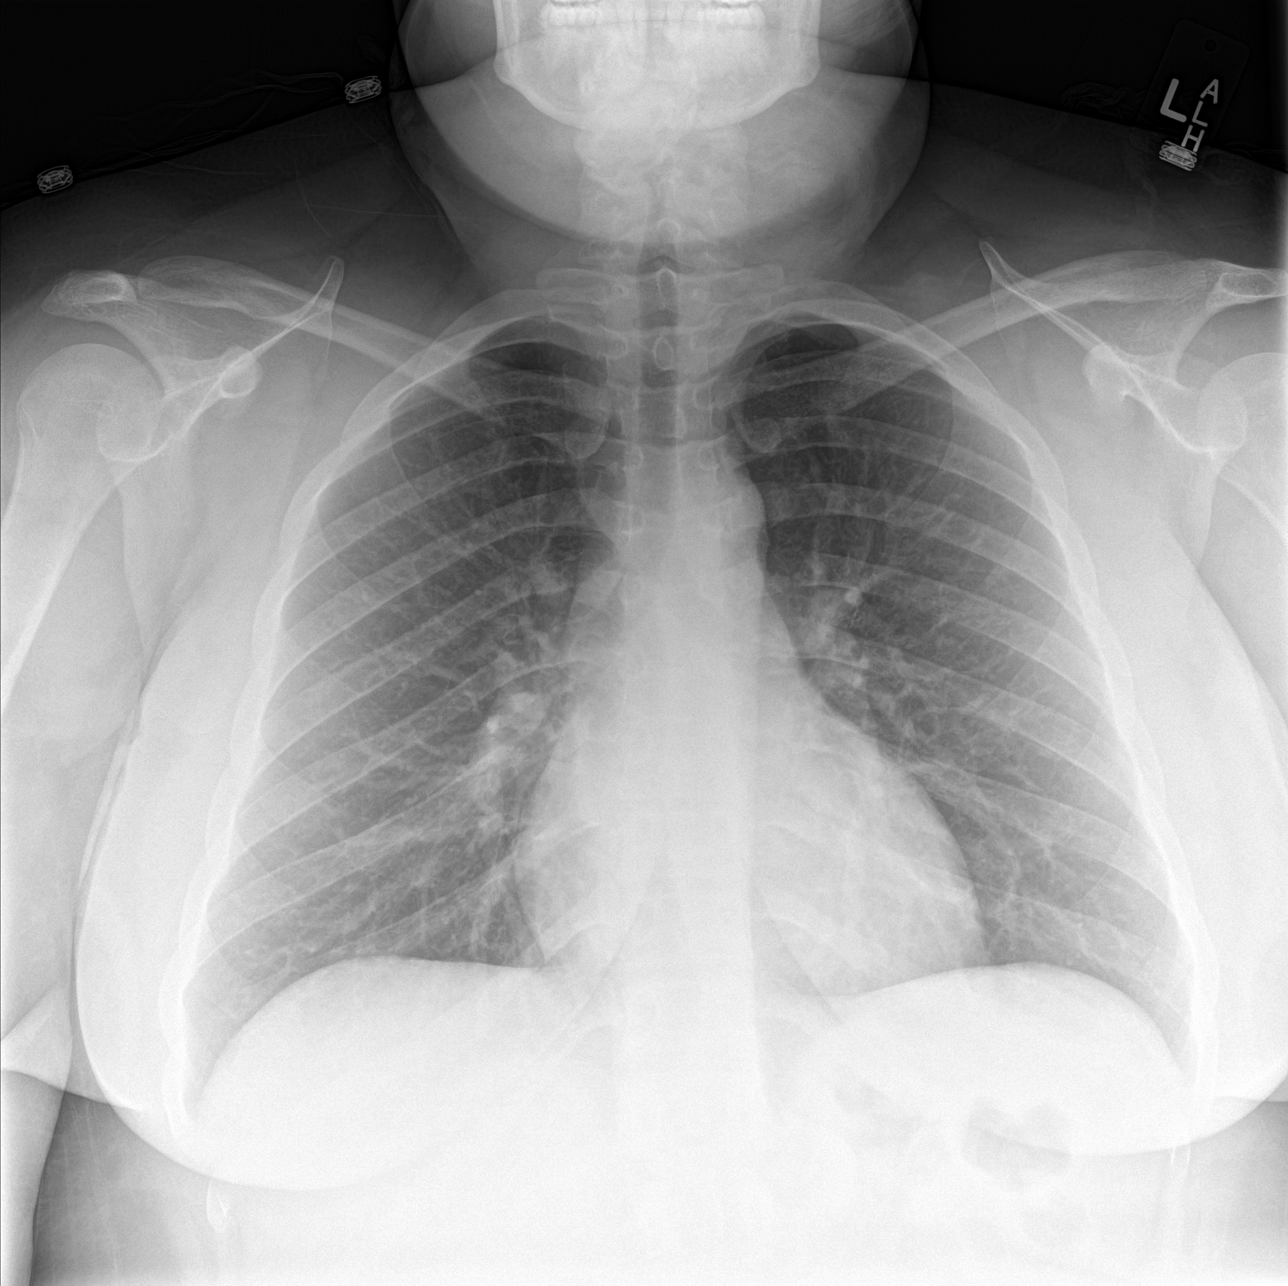

[rib ap]
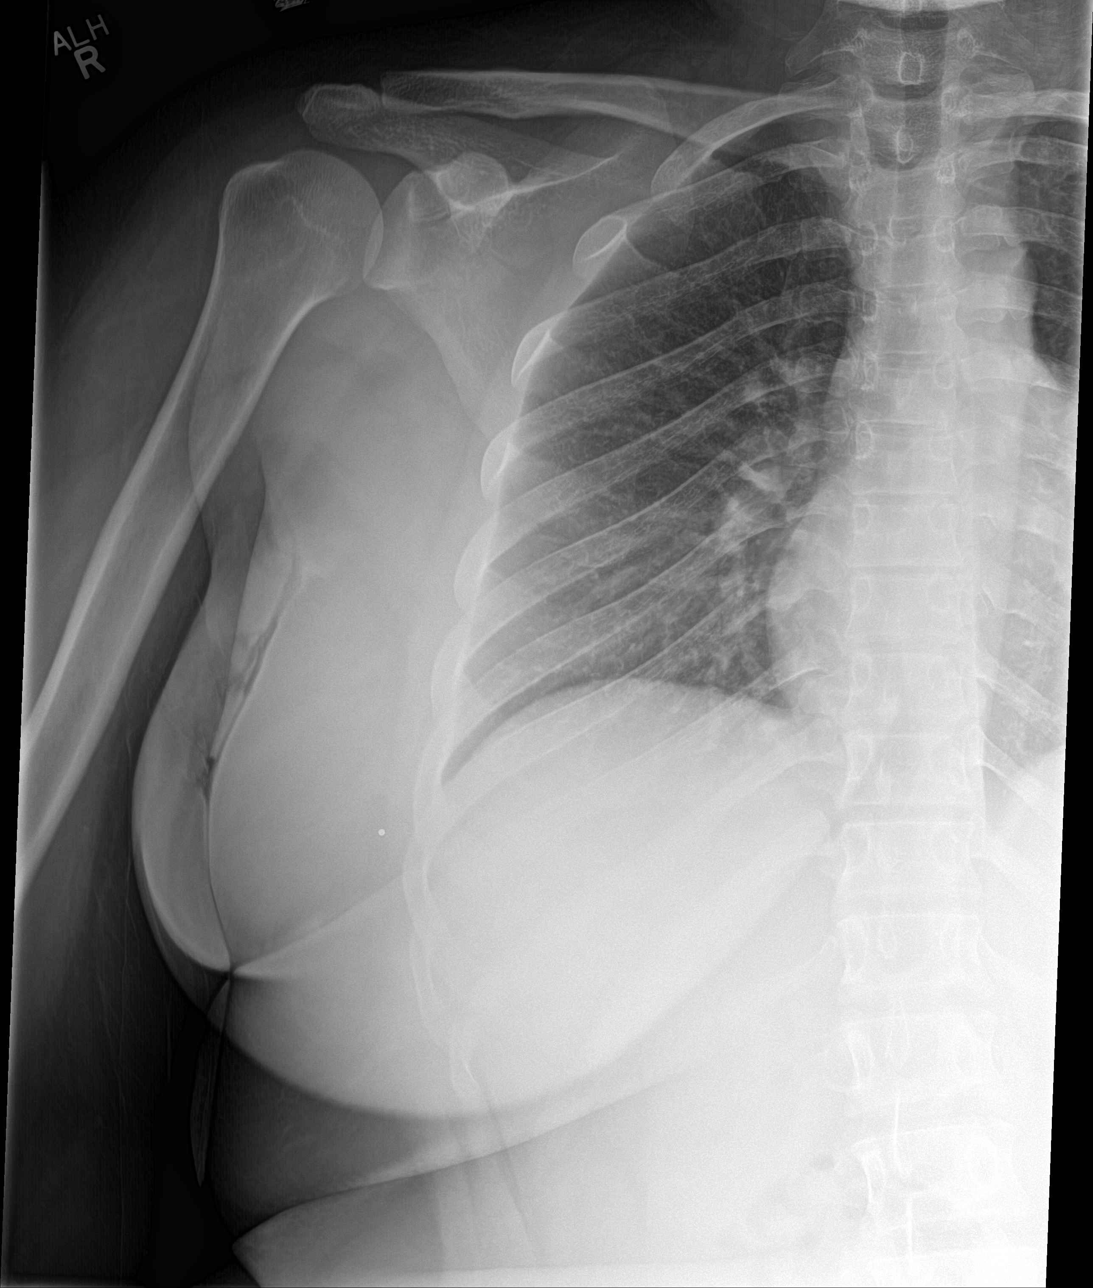

[rib ap obl]
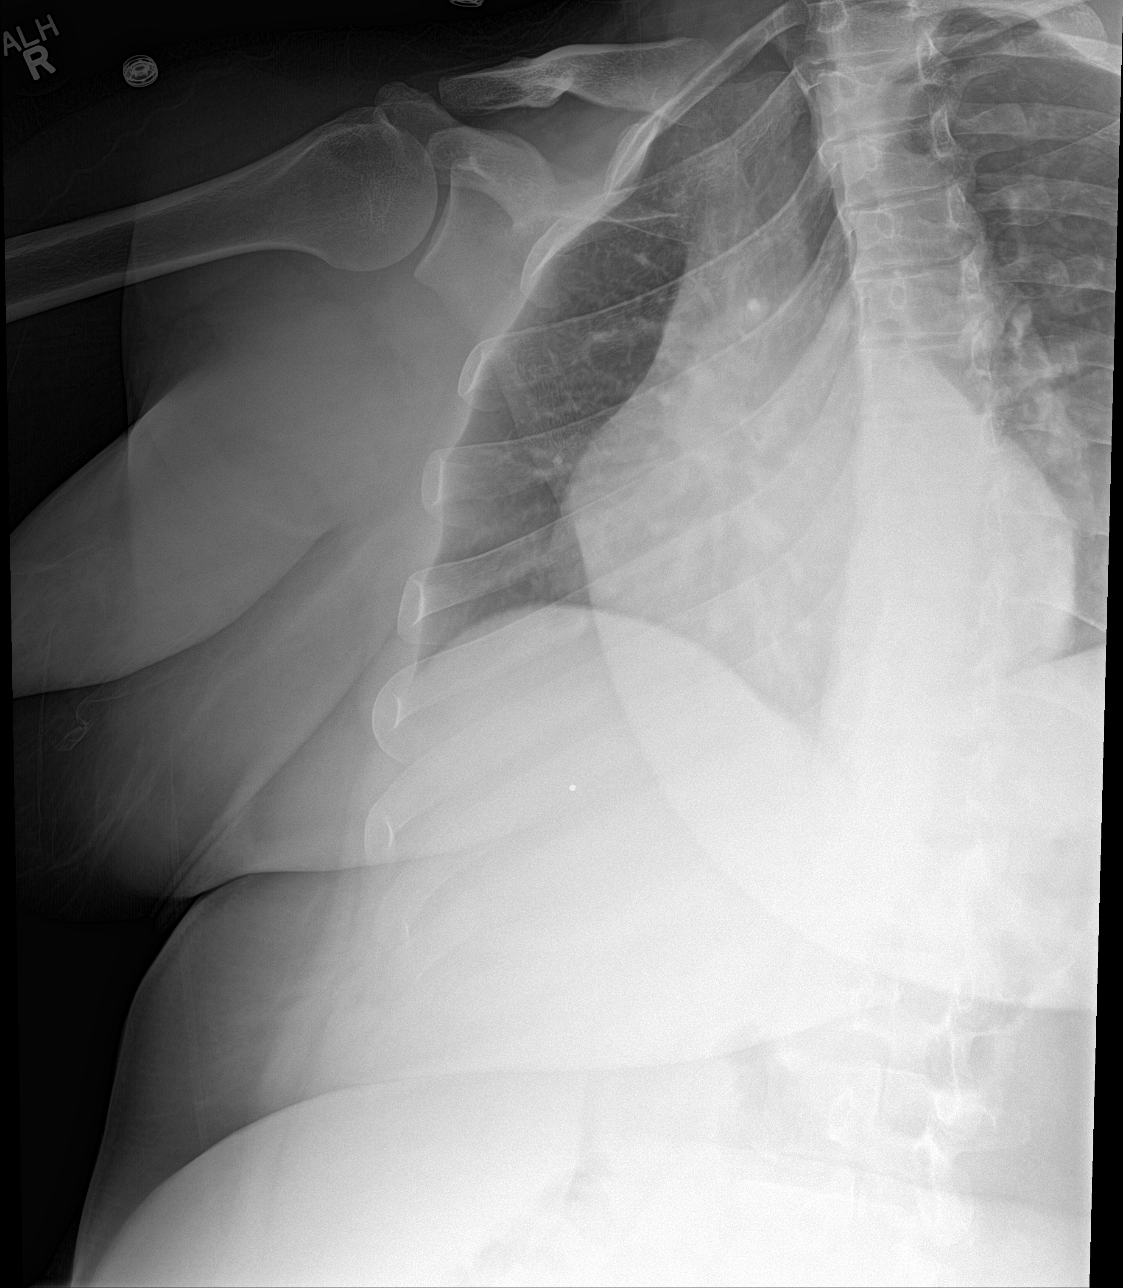

[3 of 3 positions shown; findings below may reference images not displayed]

FINDINGS: Normal sized heart. Clear lungs. Normal appearing right ribs without
fracture or pneumothorax.
IMPRESSION: Normal examination.

## 2017-12-11 ENCOUNTER — Ambulatory Visit: Admit: 2017-12-11 | Payer: MEDICAID

## 2017-12-11 ENCOUNTER — Inpatient Hospital Stay: Payer: MEDICAID

## 2017-12-11 MED ORDER — DEXTROSE 5%-LACTATED RINGERS IV
INTRAVENOUS | Status: DC
Start: 2017-12-11 — End: 2017-12-12
  Administered 2017-12-12: 02:00:00 via INTRAVENOUS

## 2017-12-11 MED ORDER — PROMETHAZINE 25 MG/ML INJECTION
25 mg/mL | Freq: Once | INTRAMUSCULAR | Status: AC
Start: 2017-12-11 — End: 2017-12-11
  Administered 2017-12-11: 20:00:00 via INTRAVENOUS

## 2017-12-11 MED ORDER — FOLIC ACID 5 MG/ML IJ SOLN
5 mg/mL | Freq: Once | INTRAMUSCULAR | Status: AC
Start: 2017-12-11 — End: 2017-12-11
  Administered 2017-12-11: 20:00:00 via INTRAVENOUS

## 2017-12-11 MED ORDER — SODIUM CHLORIDE 0.9 % IV
25 mg/mL | INTRAVENOUS | Status: DC | PRN
Start: 2017-12-11 — End: 2017-12-12
  Administered 2017-12-12 (×2): via INTRAVENOUS

## 2017-12-11 MED ORDER — SODIUM CHLORIDE 0.9 % IJ SYRG
INTRAMUSCULAR | Status: AC
Start: 2017-12-11 — End: 2017-12-11
  Administered 2017-12-12: 02:00:00

## 2017-12-11 MED FILL — PROMETHAZINE 25 MG/ML INJECTION: 25 mg/mL | INTRAMUSCULAR | Qty: 0.5

## 2017-12-11 MED FILL — SODIUM CHLORIDE 0.9 % IV: INTRAVENOUS | Qty: 1000

## 2017-12-11 MED FILL — DEXTROSE 5%-LACTATED RINGERS IV: INTRAVENOUS | Qty: 1000

## 2017-12-11 MED FILL — BD POSIFLUSH NORMAL SALINE 0.9 % INJECTION SYRINGE: INTRAMUSCULAR | Qty: 10

## 2017-12-11 MED FILL — PROMETHAZINE 25 MG/ML INJECTION: 25 mg/mL | INTRAMUSCULAR | Qty: 1

## 2017-12-11 NOTE — Progress Notes (Addendum)
1215 Patient is a G7 P3023 at [redacted]w[redacted]d presenting to unit ambulatory with complaint of abdominal pain. Denies leakage of fluid. Reports spotting throughout pregnancy; leg pain; RUQ pain. Assessed and discharged overnight from external ED/L&D unit. No emergent findings per record. Unable to obtain FHTs by doppler due to maternal habitus. Vital signs obtained.    1235 Updated Dr. Williams on patient status; orders received for ultrasound and discharge with positive fetal heart rate.     1430 Patient to ultrasound at this time.    1545 Dr. Williams at bedside reviewing plan of care with patient. PICC team paged for IV access.     1715 Patient reports some relief from nausea post medication administration.    1800 Reviewed patient status with Dr. Williams; orders received to observe overnight with fluid and medication administration; plan for discharge at 23 hours.

## 2017-12-11 NOTE — Progress Notes (Signed)
1215 Patient is a G7 P3023 at 2069w2d presenting to unit ambulatory with complaint of abdominal pain. Denies leakage of fluid. Reports spotting throughout pregnancy; leg pain; RUQ pain. Assessed and discharged overnight from external ED/L&D unit. No emergent findings per record. Unable to obtain FHTs by doppler due to maternal habitus. Vital signs obtained.    1235 Updated Dr. Mayford KnifeWilliams on patient status; orders received for ultrasound and discharge with positive fetal heart rate.     1430 Patient to ultrasound at this time.    1545 Dr. Mayford KnifeWilliams at bedside reviewing plan of care with patient. PICC team paged for IV access.     1715 Patient reports some relief from nausea post medication administration.    1800 Reviewed patient status with Dr. Mayford KnifeWilliams; orders received to observe overnight with fluid and medication administration; plan for discharge at 23 hours.

## 2017-12-12 ENCOUNTER — Observation Stay: Admit: 2017-12-12 | Payer: MEDICAID

## 2017-12-12 MED FILL — PROMETHAZINE 25 MG/ML INJECTION: 25 mg/mL | INTRAMUSCULAR | Qty: 0.5

## 2017-12-12 NOTE — Progress Notes (Addendum)
U/S called and updated on how long for pt to come down. States she is next in line.   1258: Dr. Williams called and updated on pts U/S. Order to D/C home. Prescriptions given to pt.  1403 Pt discharged in ambulatory condition with written instructions and labor precautions provided. Pt given opportunity to ask questions and voice concerns and denies any at this time. Pt encouraged to return to OB dept with changes in condition requiring further treatment.

## 2017-12-12 NOTE — Progress Notes (Addendum)
Dr. Williams called and updated on pt. Order for regular diet. States he will be up in 1 hour to see pt.   1014: Dr. Williams at bedside going over plan of care. Pt to go down for US

## 2017-12-12 NOTE — Progress Notes (Signed)
No nausea with one episode of vomiting overnight. Tolerating some po with nausea. Will discharge to home on B6 preparation and obtain RUQ ultrasound.

## 2017-12-12 NOTE — Progress Notes (Signed)
Dr. Mayford Knife called and updated on pt. Order for regular diet. States he will be up in 1 hour to see pt.   1014: Dr. Mayford Knife at bedside going over plan of care. Pt to go down for [place]

## 2017-12-12 NOTE — Progress Notes (Signed)
No nausea with one episode of vomiting overnight. Tolerating some po with nausea. Will discharge to home on B6 preparation and obtain RUQ ultrasound.

## 2017-12-12 NOTE — Progress Notes (Signed)
U/S called and updated on how long for pt to come down. States she is next in line.   1258: Dr. Mayford KnifeWilliams called and updated on pts U/S. Order to D/C home. Prescriptions given to pt.  1403 Pt discharged in ambulatory condition with written instructions and labor precautions provided. Pt given opportunity to ask questions and voice concerns and denies any at this time. [facility] changes in condition requiring further treatment.

## 2018-01-07 ENCOUNTER — Inpatient Hospital Stay: Payer: MEDICAID

## 2018-01-07 ENCOUNTER — Ambulatory Visit: Admit: 2018-01-07 | Payer: MEDICAID

## 2018-01-07 LAB — CBC WITH AUTOMATED DIFF
BASOPHILS: 0.1 % (ref 0–3)
EOSINOPHILS: 0.4 % (ref 0–5)
HCT: 32 % — ABNORMAL LOW (ref 37.0–50.0)
HGB: 10.7 gm/dl — ABNORMAL LOW (ref 13.0–17.2)
IMMATURE GRANULOCYTES: 0.3 % (ref 0.0–3.0)
LYMPHOCYTES: 22.5 % — ABNORMAL LOW (ref 28–48)
MCH: 25.1 pg — ABNORMAL LOW (ref 25.4–34.6)
MCHC: 33.4 gm/dl (ref 30.0–36.0)
MCV: 75.1 fL — ABNORMAL LOW (ref 80.0–98.0)
MONOCYTES: 4.7 % (ref 1–13)
MPV: 8.8 fL (ref 6.0–10.0)
NEUTROPHILS: 72 % — ABNORMAL HIGH (ref 34–64)
NRBC: 0 (ref 0–0)
PLATELET: 242 10*3/uL (ref 140–450)
RBC: 4.26 M/uL (ref 3.60–5.20)
RDW-SD: 39.7 (ref 36.4–46.3)
WBC: 7.3 10*3/uL (ref 4.0–11.0)

## 2018-01-07 LAB — URINALYSIS W/ RFLX MICROSCOPIC
Blood, Urine: NEGATIVE
Blood: NEGATIVE
Glucose, Ur: NEGATIVE mg/dl
Glucose: NEGATIVE mg/dl
Nitrite, Urine: POSITIVE — AB
Nitrites: POSITIVE — AB
Specific Gravity, UA: 1.025 (ref 1.005–1.030)
Specific gravity: 1.025 (ref 1.005–1.030)
Urobilinogen, UA, POCT: 1 mg/dl (ref 0.0–1.0)
Urobilinogen: 1 mg/dl (ref 0.0–1.0)
pH (UA): 6 (ref 5.0–9.0)
pH, UA: 6 (ref 5.0–9.0)

## 2018-01-07 LAB — POC URINE MICROSCOPIC

## 2018-01-07 LAB — METABOLIC PANEL, COMPREHENSIVE
ALT (SGPT): 14 U/L (ref 12–78)
AST (SGOT): 10 U/L — ABNORMAL LOW (ref 15–37)
Albumin: 3 gm/dl — ABNORMAL LOW (ref 3.4–5.0)
Alk. phosphatase: 64 U/L (ref 45–117)
Anion gap: 9 mmol/L (ref 5–15)
BUN: 6 mg/dl — ABNORMAL LOW (ref 7–25)
Bilirubin, total: 0.2 mg/dl (ref 0.2–1.0)
CO2: 25 mEq/L (ref 21–32)
Calcium: 8.6 mg/dl (ref 8.5–10.1)
Chloride: 107 mEq/L (ref 98–107)
Creatinine: 0.6 mg/dl (ref 0.6–1.3)
GFR est AA: >60
GFR est non-AA: >60
Glucose: 59 mg/dl — ABNORMAL LOW (ref 74–106)
Potassium: 3.2 mEq/L — ABNORMAL LOW (ref 3.5–5.1)
Protein, total: 6.5 gm/dl (ref 6.4–8.2)
Sodium: 140 mEq/L (ref 136–145)

## 2018-01-07 LAB — LIPASE
Lipase: 68 U/L — ABNORMAL LOW (ref 73–393)
Lipase: 68 U/L — ABNORMAL LOW (ref 73–393)

## 2018-01-07 LAB — AMYLASE
Amylase, Total: 40 U/L (ref 25–115)
Amylase: 40 U/L (ref 25–115)

## 2018-01-07 LAB — COMPREHENSIVE METABOLIC PANEL
ALT: 14 U/L (ref 12–78)
AST: 10 U/L — ABNORMAL LOW (ref 15–37)
Albumin: 3 gm/dl — ABNORMAL LOW (ref 3.4–5.0)
Alkaline Phosphatase: 64 U/L (ref 45–117)
Anion Gap: 9 mmol/L (ref 5–15)
BUN: 6 mg/dl — ABNORMAL LOW (ref 7–25)
CO2: 25 mEq/L (ref 21–32)
Calcium: 8.6 mg/dl (ref 8.5–10.1)
Chloride: 107 mEq/L (ref 98–107)
Creatinine: 0.6 mg/dl (ref 0.6–1.3)
EGFR IF NonAfrican American: >60
GFR African American: >60
Glucose: 59 mg/dl — ABNORMAL LOW (ref 74–106)
Potassium: 3.2 mEq/L — ABNORMAL LOW (ref 3.5–5.1)
Sodium: 140 mEq/L (ref 136–145)
Total Bilirubin: 0.2 mg/dl (ref 0.2–1.0)
Total Protein: 6.5 gm/dl (ref 6.4–8.2)

## 2018-01-07 LAB — CBC WITH AUTO DIFFERENTIAL
Basophils %: 0.1 % (ref 0–3)
Eosinophils %: 0.4 % (ref 0–5)
Hematocrit: 32 % — ABNORMAL LOW (ref 37.0–50.0)
Hemoglobin: 10.7 gm/dl — ABNORMAL LOW (ref 13.0–17.2)
Immature Granulocytes: 0.3 % (ref 0.0–3.0)
Lymphocytes %: 22.5 % — ABNORMAL LOW (ref 28–48)
MCH: 25.1 pg — ABNORMAL LOW (ref 25.4–34.6)
MCHC: 33.4 gm/dl (ref 30.0–36.0)
MCV: 75.1 fL — ABNORMAL LOW (ref 80.0–98.0)
MPV: 8.8 fL (ref 6.0–10.0)
Monocytes %: 4.7 % (ref 1–13)
Neutrophils %: 72 % — ABNORMAL HIGH (ref 34–64)
Nucleated RBCs: 0 (ref 0–0)
Platelets: 242 10*3/uL (ref 140–450)
RBC: 4.26 M/uL (ref 3.60–5.20)
RDW-SD: 39.7 (ref 36.4–46.3)
WBC: 7.3 10*3/uL (ref 4.0–11.0)

## 2018-01-07 MED ORDER — SODIUM CHLORIDE 0.9 % IJ SYRG
Freq: Three times a day (TID) | INTRAMUSCULAR | Status: DC
Start: 2018-01-07 — End: 2018-01-07

## 2018-01-07 MED ORDER — FAMOTIDINE 20 MG TAB
20 mg | Freq: Two times a day (BID) | ORAL | Status: DC
Start: 2018-01-07 — End: 2018-01-09
  Administered 2018-01-08 – 2018-01-09 (×4): via ORAL

## 2018-01-07 MED ORDER — SODIUM CHLORIDE 0.9 % IV
25 mg/mL | Freq: Four times a day (QID) | INTRAVENOUS | Status: DC | PRN
Start: 2018-01-07 — End: 2018-01-07
  Administered 2018-01-07: 20:00:00 via INTRAVENOUS

## 2018-01-07 MED ORDER — LACTATED RINGERS IV
INTRAVENOUS | Status: DC
Start: 2018-01-07 — End: 2018-01-09
  Administered 2018-01-07 – 2018-01-09 (×4): via INTRAVENOUS

## 2018-01-07 MED ORDER — LACTATED RINGERS BOLUS IV
Freq: Once | INTRAVENOUS | Status: AC
Start: 2018-01-07 — End: 2018-01-07
  Administered 2018-01-07: 19:00:00 via INTRAVENOUS

## 2018-01-07 MED ORDER — ONDANSETRON (PF) 4 MG/2 ML INJECTION
4 mg/2 mL | INTRAMUSCULAR | Status: DC | PRN
Start: 2018-01-07 — End: 2018-01-09
  Administered 2018-01-08 – 2018-01-09 (×2): via INTRAVENOUS

## 2018-01-07 MED ORDER — SODIUM CHLORIDE 0.9 % IJ SYRG
INTRAMUSCULAR | Status: DC | PRN
Start: 2018-01-07 — End: 2018-01-09
  Administered 2018-01-09: 03:00:00 via INTRAVENOUS

## 2018-01-07 MED ORDER — ZOLPIDEM 5 MG TAB
5 mg | Freq: Every evening | ORAL | Status: AC | PRN
Start: 2018-01-07 — End: 2018-01-08
  Administered 2018-01-08 – 2018-01-09 (×2): via ORAL

## 2018-01-07 MED ORDER — NITROFURANTOIN (25% MACROCRYSTAL FORM) 100 MG CAP
100 mg | Freq: Two times a day (BID) | ORAL | Status: DC
Start: 2018-01-07 — End: 2018-01-08
  Administered 2018-01-08 (×2): via ORAL

## 2018-01-07 MED ORDER — SODIUM CHLORIDE 0.9 % IV
25 mg/mL | Freq: Three times a day (TID) | INTRAVENOUS | Status: DC | PRN
Start: 2018-01-07 — End: 2018-01-08
  Administered 2018-01-08: 10:00:00 via INTRAVENOUS

## 2018-01-07 MED ORDER — ACETAMINOPHEN 325 MG TABLET
325 mg | ORAL | Status: DC | PRN
Start: 2018-01-07 — End: 2018-01-09
  Administered 2018-01-08: 02:00:00 via ORAL

## 2018-01-07 MED ORDER — PRENATAL VITAMIN WITHOUT CALCIUM #5-IRON-FA 106.5 MG-1 MG CAP
Freq: Every day | ORAL | Status: DC
Start: 2018-01-07 — End: 2018-01-09
  Administered 2018-01-08 – 2018-01-09 (×2): via ORAL

## 2018-01-07 MED FILL — PROMETHAZINE 25 MG/ML INJECTION: 25 mg/mL | INTRAMUSCULAR | Qty: 1

## 2018-01-07 MED FILL — LACTATED RINGERS IV: INTRAVENOUS | Qty: 500

## 2018-01-07 MED FILL — LACTATED RINGERS IV: INTRAVENOUS | Qty: 1000

## 2018-01-07 MED FILL — NITROFURANTOIN (25% MACROCRYSTAL FORM) 100 MG CAP: 100 mg | ORAL | Qty: 1

## 2018-01-07 NOTE — Other (Signed)
2001-Dr. Melek paged.    2006-Return call from Dr. Alphonzo Dublin; informed of low K+ @ 3.2, Glucose @ 59, and current meds pt is on--Labetalol and Synthroid.  Orders rec'd to continue Labetalol, Potassium PO, and Synthroid as presribed, BP parameters to call with BP >150/100 x 2 episodes, and may have Dilaudid 1 mg PO x 2 dose later tonight if needed. Orders reviewed and verified with MD after rec'd.

## 2018-01-07 NOTE — Other (Addendum)
Rec'd this G7P4 IUP @ 25.1 wks s/p fall @ 1 pm while @ work. Pt w/hx of CHTN, hypothyroid, and nausea throughout pregnancy and has been on Phenergan @ home. Pt states she hit her right flank on the floor; no bruising noted and right flank nontender, not guarded with palpation. Abd soft/nontender.  Denies VB/SROM. Pt reports feeling very few fetal movement, but states this is normal for her.  FHR obtained by EFM @ 130s @ above suprapubic bone midline. Pt w/hx of 2 prenatal visits during pregnancy w/Dr. Renita Papa and pt states she will continue prenatal care with Dr. Alphonzo Dublin, hx of [facility] and states she also had PIH with  2 of her pregnancies including current one and hx of GDM-diet controlled with last pregnancy. Pt states she takes Labetalol 200 mg BID and Synthroid 75 mg every day and states she did take her Labetalol & Synthroid this am.  POC reviewed with pt per MD orders. Verbalized understanding. Discussed importance of taking Macrobid as prescribed for UTI; pt agreeable to take med now.Pt requesting pain med for hip/lower back pain and antiemetic. Call bell w/i reach; emesis basin w/i reach. Fall Risk/allergy band applied and pt verified ID band. Assessment in progress.

## 2018-01-07 NOTE — Progress Notes (Signed)
Obstetrics Progress Note    Name: Linda [name] MRN: [id number]  SSN: [id number]    Date of Birth: Aug 05, 1990  Age: 27 y.o.  Sex: female      Subjective:      LOS: 0 days    Estimated Date of Delivery: 04/21/18   Gestational Age Today: [redacted]w[redacted]d     Patient seen today with complain of fall today at work on her side hit her right side of abdomen ,she also complain of nausea and vomiting not able to keep anything down ,she also did not able to keep her blood pressure medication down ,  No bleeding ,no contraction ,no leaking fluid .she said every time she vomit she sees spots of blood .she did not see dr whitted in office last time went to the office was in 11/13/2017 ,she did not have anatomy US due to her limited prenatal care .    Objective:     Vitals:  Blood pressure 140/68, pulse 80, last menstrual period 08/14/2017, unknown if currently breastfeeding.   No data recorded.    Systolic (24hrs), Avg:140 , Min:140 , Max:140      Diastolic (24hrs), Avg:68, Min:68, Max:68       Intake and Output:         Physical Exam:  Abdomen: soft, nontender  Fundus: soft and non tender  Perineum: blood absent, amniotic fluid absent  Cervical Exam: Closed/Thick/High       Membranes:  Intact    Uterine Activity:  None    Fetal Heart Rate: reassuring       Labs:   Recent Results (from the past 36 hour(s))   CBC WITH AUTOMATED DIFF    Collection Time: 01/07/18  2:41 PM   Result Value Ref Range    WBC 7.3 4.0 - 11.0 1000/mm3    RBC 4.26 3.60 - 5.20 M/uL    HGB 10.7 (L) 13.0 - 17.2 gm/dl    HCT 13.0 (L) 86.5 - 50.0 %    MCV 75.1 (L) 80.0 - 98.0 fL    MCH 25.1 (L) 25.4 - 34.6 pg    MCHC 33.4 30.0 - 36.0 gm/dl    PLATELET 784 696 - 295 1000/mm3    MPV 8.8 6.0 - 10.0 fL    RDW-SD 39.7 36.4 - 46.3      NRBC 0 0 - 0      IMMATURE GRANULOCYTES 0.3 0.0 - 3.0 %    NEUTROPHILS 72.0 (H) 34 - 64 %    LYMPHOCYTES 22.5 (L) 28 - 48 %    MONOCYTES 4.7 1 - 13 %    EOSINOPHILS 0.4 0 - 5 %    BASOPHILS 0.1 0 - 3 %   URINALYSIS W/ RFLX MICROSCOPIC     Collection Time: 01/07/18  3:20 PM   Result Value Ref Range    Color YELLOW YELLOW,STRAW      Appearance CLOUDY (A) CLEAR      Glucose NEGATIVE NEGATIVE,Negative mg/dl    Bilirubin SMALL (A) NEGATIVE,Negative      Ketone TRACE (A) NEGATIVE,Negative mg/dl    Specific gravity 2.841 1.005 - 1.030      Blood NEGATIVE NEGATIVE,Negative      pH (UA) 6.0 5.0 - 9.0      Protein TRACE (A) NEGATIVE,Negative mg/dl    Urobilinogen 1.0 0.0 - 1.0 mg/dl    Nitrites POSITIVE (A) NEGATIVE,Negative      Leukocyte Esterase SMALL (A) NEGATIVE,Negative     POC URINE MICROSCOPIC  Collection Time: 01/07/18  3:20 PM   Result Value Ref Range    Epithelial cells, squamous 15-29 /LPF    WBC 5-9 /HPF    Mucus PRESENT      Bacteria 4+ /HPF     Result Information     Status: Final result (Exam End: 01/07/2018 15:53) Provider Status: Open   Study Result     Pregnancy ultrasound. Comparison 12/11/2017.  ??  INDICATION: Larey Seat out of work. Evaluate for fetal well-being.  ??  FINDINGS: Single viable intrahepatic pregnancy vertex position. Fetal heart rate  150 bpm. Amniotic fluid index 10.5 cm.. Largest vertical pocket 4.1 cm. Fetal  movement noted. Placenta normal. And anterior. Not previa  ??  IMPRESSION  IMPRESSION: Findings as described above  ??       Assessment and Plan:      Principal Problem:    History of recent fall (01/07/2018)    Active Problems:    Acute cystitis (01/07/2018)      Obesity affecting pregnancy, antepartum, second trimester (01/07/2018)      Limited prenatal care in second trimester (01/07/2018)       Iv fluid hydration ,phenergan iv ,tylenol prn   Macrobid 100 mg po bid x 7 days     Signed By: Leonette Nutting, MD     January 07, 2018

## 2018-01-07 NOTE — Other (Signed)
2045-Pt requesting heating pad for lower back/flank from fall.  Will call MD for order.    2100-Dr. Melek called; return call rec'd immediately. MD informed of pt request; order rec'd for KPAD.  Informed that pt agreed to start Macrobid and also given Labetalol per Rx.

## 2018-01-07 NOTE — Other (Signed)
KPAD applied to right lower back/hip for comfort, set to 100 deg. Encouraged to use intermittently. SCDs in place bilaterally. Instructed to RN with need to void d/t s/p fall; verbalized understanding.

## 2018-01-07 NOTE — Progress Notes (Addendum)
Pt sent up from ED. Pt fell at work and landed on right side. Pt has c/o pain on right side and lower back. No fetal movement in 2 days. Pt states she has been vomiting and not able to hold onto food. Pt has abdominal pain that radiates down to vagina. Pain 9/10.   1414  Dr. melek called and updated on pt.   1620: Dr. Melek on unit reviewing pt care.

## 2018-01-07 NOTE — Progress Notes (Signed)
Obstetrics Progress Note    Name: MARYCATHERINE MANISCALCO MRN: [id number]  SSN: [id number]    Date of Birth: May 17, 1990  Age: 27 y.o.  Sex: female      Subjective:      LOS: 0 days    Estimated Date of Delivery: 04/21/18   Gestational Age Today: [redacted]w[redacted]d     Patient seen today with complain of fall today at work on her side hit her right side of abdomen ,she also complain of nausea and vomiting not able to keep anything down ,she also did not able to keep her blood pressure medication down ,  No bleeding ,no contraction ,no leaking fluid .she said every time she vomit she sees spots of blood .she did not see dr whitted in office last time went to the office was in 11/13/2017 ,she did not have anatomy US due to her limited prenatal care .    Objective:     Vitals:  Blood pressure 140/68, pulse 80, last menstrual period 08/14/2017, unknown if currently breastfeeding.   No data recorded.    Systolic (24hrs), Avg:140 , Min:140 , Max:140      Diastolic (24hrs), Avg:68, Min:68, Max:68       Intake and Output:         Physical Exam:  Abdomen: soft, nontender  Fundus: soft and non tender  Perineum: blood absent, amniotic fluid absent  Cervical Exam: Closed/Thick/High       Membranes:  Intact    Uterine Activity:  None    Fetal Heart Rate: reassuring       Labs:   Recent Results (from the past 36 hour(s))   CBC WITH AUTOMATED DIFF    Collection Time: 01/07/18  2:41 PM   Result Value Ref Range    WBC 7.3 4.0 - 11.0 1000/mm3    RBC 4.26 3.60 - 5.20 M/uL    HGB 10.7 (L) 13.0 - 17.2 gm/dl    HCT 95.6 (L) 21.3 - 50.0 %    MCV 75.1 (L) 80.0 - 98.0 fL    MCH 25.1 (L) 25.4 - 34.6 pg    MCHC 33.4 30.0 - 36.0 gm/dl    PLATELET 086 578 - 469 1000/mm3    MPV 8.8 6.0 - 10.0 fL    RDW-SD 39.7 36.4 - 46.3      NRBC 0 0 - 0      IMMATURE GRANULOCYTES 0.3 0.0 - 3.0 %    NEUTROPHILS 72.0 (H) 34 - 64 %    LYMPHOCYTES 22.5 (L) 28 - 48 %    MONOCYTES 4.7 1 - 13 %    EOSINOPHILS 0.4 0 - 5 %    BASOPHILS 0.1 0 - 3 %   URINALYSIS W/ RFLX MICROSCOPIC    Collection  Time: 01/07/18  3:20 PM   Result Value Ref Range    Color YELLOW YELLOW,STRAW      Appearance CLOUDY (A) CLEAR      Glucose NEGATIVE NEGATIVE,Negative mg/dl    Bilirubin SMALL (A) NEGATIVE,Negative      Ketone TRACE (A) NEGATIVE,Negative mg/dl    Specific gravity 6.295 1.005 - 1.030      Blood NEGATIVE NEGATIVE,Negative      pH (UA) 6.0 5.0 - 9.0      Protein TRACE (A) NEGATIVE,Negative mg/dl    Urobilinogen 1.0 0.0 - 1.0 mg/dl    Nitrites POSITIVE (A) NEGATIVE,Negative      Leukocyte Esterase SMALL (A) NEGATIVE,Negative     POC URINE MICROSCOPIC  Collection Time: 01/07/18  3:20 PM   Result Value Ref Range    Epithelial cells, squamous 15-29 /LPF    WBC 5-9 /HPF    Mucus PRESENT      Bacteria 4+ /HPF     Result Information     Status: Final result (Exam End: 01/07/2018 15:53) Provider Status: Open   Study Result     Pregnancy ultrasound. Comparison 12/11/2017.  ??  INDICATION: Larey Seat out of work. Evaluate for fetal well-being.  ??  FINDINGS: Single viable intrahepatic pregnancy vertex position. Fetal heart rate  150 bpm. Amniotic fluid index 10.5 cm.. Largest vertical pocket 4.1 cm. Fetal  movement noted. Placenta normal. And anterior. Not previa  ??  IMPRESSION  IMPRESSION: Findings as described above  ??       Assessment and Plan:      Principal Problem:    History of recent fall (01/07/2018)    Active Problems:    Acute cystitis (01/07/2018)      Obesity affecting pregnancy, antepartum, second trimester (01/07/2018)      Limited prenatal care in second trimester (01/07/2018)       Iv fluid hydration ,phenergan iv ,tylenol prn   Macrobid 100 mg po bid x 7 days     Signed By: Leonette Nutting, MD     January 07, 2018

## 2018-01-07 NOTE — Progress Notes (Signed)
Pt sent up from ED. Pt fell at work and landed on right side. Pt has c/o pain on right side and lower back. No fetal movement in 2 days. Pt states she has been vomiting and not able to hold onto food. Pt has abdominal pain that radiates down to vagina. Pain 9/10.   1414  Dr. Alphonzo Dublin called and updated on pt.   1620: Dr. Alphonzo Dublin on unit reviewing pt care.

## 2018-01-08 LAB — KLEIHAUER-BET. STAIN,FETAL HGB
Fetal Cells: NONE SEEN
Fetomaternal Hemorrhage: 0 mL

## 2018-01-08 MED ORDER — SODIUM CHLORIDE 0.9 % IV
25 mg/mL | Freq: Four times a day (QID) | INTRAVENOUS | Status: DC | PRN
Start: 2018-01-08 — End: 2018-01-09
  Administered 2018-01-08: 18:00:00 via INTRAVENOUS

## 2018-01-08 MED ORDER — POTASSIUM CHLORIDE SR 20 MEQ TAB, PARTICLES/CRYSTALS
20 mEq | Freq: Two times a day (BID) | ORAL | Status: DC
Start: 2018-01-08 — End: 2018-01-08
  Administered 2018-01-08 (×2): via ORAL

## 2018-01-08 MED ORDER — ACETAMINOPHEN 1,000 MG/100 ML (10 MG/ML) IV
1000 mg/100 mL (10 mg/mL) | Freq: Four times a day (QID) | INTRAVENOUS | Status: DC | PRN
Start: 2018-01-08 — End: 2018-01-09
  Administered 2018-01-08 – 2018-01-09 (×2): via INTRAVENOUS

## 2018-01-08 MED ORDER — LABETALOL 100 MG TAB
100 mg | Freq: Two times a day (BID) | ORAL | Status: DC
Start: 2018-01-08 — End: 2018-01-09
  Administered 2018-01-08 – 2018-01-09 (×3): via ORAL

## 2018-01-08 MED ORDER — SODIUM CHLORIDE 0.9 % IV PIGGY BACK
1 gram | Freq: Two times a day (BID) | INTRAVENOUS | Status: DC
Start: 2018-01-08 — End: 2018-01-09
  Administered 2018-01-08 – 2018-01-09 (×3): via INTRAVENOUS

## 2018-01-08 MED ORDER — HYDROMORPHONE 1 MG/ML INJECTION SOLUTION
1 mg/mL | Freq: Once | INTRAMUSCULAR | Status: AC | PRN
Start: 2018-01-08 — End: 2018-01-08
  Administered 2018-01-08: 06:00:00 via INTRAVENOUS

## 2018-01-08 MED ORDER — CEFAZOLIN 1 GRAM/50 ML IN DEXTROSE (ISO-OSMOTIC) IVPB
1 gram/50 mL | Freq: Two times a day (BID) | INTRAVENOUS | Status: DC
Start: 2018-01-08 — End: 2018-01-08

## 2018-01-08 MED ORDER — POTASSIUM CHLORIDE 10 MEQ/100 ML IV PIGGY BACK
10 mEq/0 mL | INTRAVENOUS | Status: AC
Start: 2018-01-08 — End: 2018-01-08
  Administered 2018-01-08 (×5): via INTRAVENOUS

## 2018-01-08 MED ORDER — TRAMADOL 50 MG TAB
50 mg | Freq: Four times a day (QID) | ORAL | Status: DC | PRN
Start: 2018-01-08 — End: 2018-01-09
  Administered 2018-01-08 – 2018-01-09 (×4): via ORAL

## 2018-01-08 MED ORDER — LEVOTHYROXINE 50 MCG TAB
50 mcg | ORAL | Status: DC
Start: 2018-01-08 — End: 2018-01-09
  Administered 2018-01-08 – 2018-01-09 (×2): via ORAL

## 2018-01-08 MED FILL — POTASSIUM CHLORIDE 10 MEQ/100 ML IV PIGGY BACK: 10 mEq/0 mL | INTRAVENOUS | Qty: 100

## 2018-01-08 MED FILL — FAMOTIDINE 20 MG TAB: 20 mg | ORAL | Qty: 1

## 2018-01-08 MED FILL — CEFAZOLIN 1 GRAM SOLUTION FOR INJECTION: 1 gram | INTRAMUSCULAR | Qty: 1000

## 2018-01-08 MED FILL — POTASSIUM CHLORIDE SR 20 MEQ TAB, PARTICLES/CRYSTALS: 20 mEq | ORAL | Qty: 1

## 2018-01-08 MED FILL — BD POSIFLUSH NORMAL SALINE 0.9 % INJECTION SYRINGE: INTRAMUSCULAR | Qty: 10

## 2018-01-08 MED FILL — ACETAMINOPHEN 325 MG TABLET: 325 mg | ORAL | Qty: 2

## 2018-01-08 MED FILL — LABETALOL 100 MG TAB: 100 mg | ORAL | Qty: 2

## 2018-01-08 MED FILL — TRAMADOL 50 MG TAB: 50 mg | ORAL | Qty: 2

## 2018-01-08 MED FILL — PRENATAL-U 106.5 MG-1 MG CAPSULE: ORAL | Qty: 1

## 2018-01-08 MED FILL — NITROFURANTOIN (25% MACROCRYSTAL FORM) 100 MG CAP: 100 mg | ORAL | Qty: 1

## 2018-01-08 MED FILL — ONDANSETRON (PF) 4 MG/2 ML INJECTION: 4 mg/2 mL | INTRAMUSCULAR | Qty: 2

## 2018-01-08 MED FILL — ZOLPIDEM 5 MG TAB: 5 mg | ORAL | Qty: 1

## 2018-01-08 MED FILL — OFIRMEV 1,000 MG/100 ML (10 MG/ML) INTRAVENOUS SOLUTION: 1000 mg/100 mL (10 mg/mL) | INTRAVENOUS | Qty: 100

## 2018-01-08 MED FILL — HYDROMORPHONE 1 MG/ML INJECTION SOLUTION: 1 mg/mL | INTRAMUSCULAR | Qty: 1

## 2018-01-08 MED FILL — LEVOTHYROXINE 25 MCG TAB: 25 mcg | ORAL | Qty: 1

## 2018-01-08 MED FILL — PROMETHAZINE 25 MG/ML INJECTION: 25 mg/mL | INTRAMUSCULAR | Qty: 1

## 2018-01-08 NOTE — Other (Signed)
Bedside report to S. Redcay, RN

## 2018-01-08 NOTE — Other (Signed)
0515-Pt c/o nausea. OOB to BR for void.      0558-Pt resting comfortably on entering room.  Awoken as this RN entered room; continues to c/o nausea; requests med.  Pt states KPAD has helped with back/right lateral abd pain.

## 2018-01-08 NOTE — Other (Signed)
Pt OOB for PM hygiene and shower. Linens changed.

## 2018-01-08 NOTE — Progress Notes (Signed)
Dr. Hollander paged, return call rec'd immediately. Informed of BPs and scheduled 2100 Labetalol held.  Orders rec' for pt to shower prn.

## 2018-01-08 NOTE — Other (Signed)
Pt back in bed after shower. No questions/concerns/complaints voiced at this time. SCDs reapplied bilaterally.

## 2018-01-08 NOTE — Progress Notes (Signed)
Obstetrics Progress Note    Name: Linda [name] MRN: [id number]  SSN: [id number]    Date of Birth: 1990/05/13  Age: 27 y.o.  Sex: female     LOS: 0 days   Estimated Date of Delivery: 04/21/18  Gestational Age Today: [redacted]w[redacted]d     Subjective:     Patient admitted for a fall at work and nausea and vomiting. She slipped on some liquid egg mixture at work and landed on her R side. Unclear if direct trauma to abdomen. Labs and [place] normal and is reporting normal fetal movements.     She has nausea/vomiting and pain which flares every few weeks according to her. When she came in, she was diagnosed with a UTI and hypokalemia. The nurse told me she threw up all of her meds, but when I went into the room, she is eating a regular breakfast tray. She claims zofran does not work, only phenergan. She received dilaudid last night but I told her she will not get any IV narcotics from me. Will try IV tylenol and tramadol PO if she can tolerate it. I switched her to IV Ancef for the UTI and started K+ runs.     She has not been seen in our office since 8/29. She has not been seen anywhere else. She made it sound like she had her records released to her and she has been trying to find another doctor.     Objective:     Vitals:  Blood pressure 118/52, pulse 68, temperature 98.2 ??F (36.8 ??C), resp. rate 14, last menstrual period 08/14/2017, unknown if currently breastfeeding.   Temp (24hrs), Avg:98.2 ??F (36.8 ??C), Min:97.7 ??F (36.5 ??C), Max:98.5 ??F (36.9 ??C)    Systolic (273XTG, AGYI:948, Min:105 , MNIO:270     Diastolic (235KKX, AFGH:82 Min:52, Max:72       Intake and Output:         Physical Exam:  Deferred       Membranes:  Intact    Uterine Activity:  None    Fetal Heart Rate:  Reactive        Labs:   Recent Results (from the past 36 hour(s))   CBC WITH AUTOMATED DIFF    Collection Time: 01/07/18  2:41 PM   Result Value Ref Range    WBC 7.3 4.0 - 11.0 1000/mm3    RBC 4.26 3.60 - 5.20 M/uL    HGB 10.7 (L) 13.0 - 17.2 gm/dl     HCT 32.0 (L) 37.0 - 50.0 %    MCV 75.1 (L) 80.0 - 98.0 fL    MCH 25.1 (L) 25.4 - 34.6 pg    MCHC 33.4 30.0 - 36.0 gm/dl    PLATELET 242 140 - 450 1000/mm3    MPV 8.8 6.0 - 10.0 fL    RDW-SD 39.7 36.4 - 46.3      NRBC 0 0 - 0      IMMATURE GRANULOCYTES 0.3 0.0 - 3.0 %    NEUTROPHILS 72.0 (H) 34 - 64 %    LYMPHOCYTES 22.5 (L) 28 - 48 %    MONOCYTES 4.7 1 - 13 %    EOSINOPHILS 0.4 0 - 5 %    BASOPHILS 0.1 0 - 3 %   METABOLIC PANEL, COMPREHENSIVE    Collection Time: 01/07/18  3:00 PM   Result Value Ref Range    Sodium 140 136 - 145 mEq/L    Potassium 3.2 (L) 3.5 - 5.1  mEq/L    Chloride 107 98 - 107 mEq/L    CO2 25 21 - 32 mEq/L    Glucose 59 (L) 74 - 106 mg/dl    BUN 6 (L) 7 - 25 mg/dl    Creatinine 0.6 0.6 - 1.3 mg/dl    GFR est AA >60.0      GFR est non-AA >60      Calcium 8.6 8.5 - 10.1 mg/dl    AST (SGOT) 10 (L) 15 - 37 U/L    ALT (SGPT) 14 12 - 78 U/L    Alk. phosphatase 64 45 - 117 U/L    Bilirubin, total 0.2 0.2 - 1.0 mg/dl    Protein, total 6.5 6.4 - 8.2 gm/dl    Albumin 3.0 (L) 3.4 - 5.0 gm/dl    Anion gap 9 5 - 15 mmol/L   AMYLASE    Collection Time: 01/07/18  3:00 PM   Result Value Ref Range    Amylase, Total 40 25 - 115 U/L   LIPASE    Collection Time: 01/07/18  3:00 PM   Result Value Ref Range    Lipase 68 (L) 73 - 393 U/L   URINALYSIS W/ RFLX MICROSCOPIC    Collection Time: 01/07/18  3:20 PM   Result Value Ref Range    Color YELLOW YELLOW,STRAW      Appearance CLOUDY (A) CLEAR      Glucose NEGATIVE NEGATIVE,Negative mg/dl    Bilirubin SMALL (A) NEGATIVE,Negative      Ketone TRACE (A) NEGATIVE,Negative mg/dl    Specific gravity 1.025 1.005 - 1.030      Blood NEGATIVE NEGATIVE,Negative      pH (UA) 6.0 5.0 - 9.0      Protein TRACE (A) NEGATIVE,Negative mg/dl    Urobilinogen 1.0 0.0 - 1.0 mg/dl    Nitrites POSITIVE (A) NEGATIVE,Negative      Leukocyte Esterase SMALL (A) NEGATIVE,Negative     POC URINE MICROSCOPIC    Collection Time: 01/07/18  3:20 PM   Result Value Ref Range     Epithelial cells, squamous 15-29 /LPF    WBC 5-9 /HPF    Mucus PRESENT      Bacteria 4+ /HPF       Assessment and Plan:      Principal Problem:    History of recent fall (01/07/2018)    Active Problems:    Acute cystitis (01/07/2018)      Obesity affecting pregnancy, antepartum, second trimester (01/07/2018)      Limited prenatal care in second trimester (01/07/2018)      History of fall (01/07/2018)       1. S/p Fall  -[place] and labs normal  -NST q shift  -await KB  -will treat pain with IV tylenol and tramadol for breakthrough pain    2. N/v  -possibly from UTI. IV ancef 1g q12h until she can tolerate macrobid  -replace K+  -IV phenergan and zofran until she can tolerate PO    The last time she was here, we tried to restrict her diet and she became very angry and threatened to leave AMA. She continues to try to tolerate a regular diet although it is better to give the stomach a rest from fatty, greasy foods. (see note 8/22)      Signed By: Lacretia Leigh, MD     January 08, 2018

## 2018-01-08 NOTE — Other (Signed)
This RN and Leslye Peer RN attempt to trace baby with EFM but unable to continuously keep on the monitor for NST.  Dr. Nelle Don was notified and she states it is ok to contniue FHR via doppler Q shift.  No BPP needed.

## 2018-01-08 NOTE — Progress Notes (Signed)
Dr. Nelle Don paged, return call rec'd immediately. Informed of BPs and scheduled 2100 Labetalol held.  Orders rec' for pt to shower prn.

## 2018-01-08 NOTE — Progress Notes (Signed)
Obstetrics Progress Note    Name: Linda [name] MRN: [id number]  SSN: [id number]    Date of Birth: 12-29-1990  Age: 27 y.o.  Sex: female     LOS: 0 days   Estimated Date of Delivery: 04/21/18  Gestational Age Today: [redacted]w[redacted]d     Subjective:     Patient admitted for a fall at work and nausea and vomiting. She slipped on some liquid egg mixture at work and landed on her R side. Unclear if direct trauma to abdomen. Labs and [place] normal and is reporting normal fetal movements.     She has nausea/vomiting and pain which flares every few weeks according to her. When she came in, she was diagnosed with a UTI and hypokalemia. The nurse told me she threw up all of her meds, but when I went into the room, she is eating a regular breakfast tray. She claims zofran does not work, only phenergan. She received dilaudid last night but I told her she will not get any IV narcotics from me. Will try IV tylenol and tramadol PO if she can tolerate it. I switched her to IV Ancef for the UTI and started K+ runs.     She has not been seen in our office since 8/29. She has not been seen anywhere else. She made it sound like she had her records released to her and she has been trying to find another doctor.     Objective:     Vitals:  Blood pressure 118/52, pulse 68, temperature 98.2 ??F (36.8 ??C), resp. rate 14, last menstrual period 08/14/2017, unknown if currently breastfeeding.   Temp (24hrs), Avg:98.2 ??F (36.8 ??C), Min:97.7 ??F (36.5 ??C), Max:98.5 ??F (36.9 ??C)    Systolic (240NUU, AVOZ:366, Min:105 , MYQI:347     Diastolic (242VZD, AGLO:75 Min:52, Max:72       Intake and Output:         Physical Exam:  Deferred       Membranes:  Intact    Uterine Activity:  None    Fetal Heart Rate:  Reactive        Labs:   Recent Results (from the past 36 hour(s))   CBC WITH AUTOMATED DIFF    Collection Time: 01/07/18  2:41 PM   Result Value Ref Range    WBC 7.3 4.0 - 11.0 1000/mm3    RBC 4.26 3.60 - 5.20 M/uL    HGB 10.7 (L) 13.0 - 17.2 gm/dl    HCT  32.0 (L) 37.0 - 50.0 %    MCV 75.1 (L) 80.0 - 98.0 fL    MCH 25.1 (L) 25.4 - 34.6 pg    MCHC 33.4 30.0 - 36.0 gm/dl    PLATELET 242 140 - 450 1000/mm3    MPV 8.8 6.0 - 10.0 fL    RDW-SD 39.7 36.4 - 46.3      NRBC 0 0 - 0      IMMATURE GRANULOCYTES 0.3 0.0 - 3.0 %    NEUTROPHILS 72.0 (H) 34 - 64 %    LYMPHOCYTES 22.5 (L) 28 - 48 %    MONOCYTES 4.7 1 - 13 %    EOSINOPHILS 0.4 0 - 5 %    BASOPHILS 0.1 0 - 3 %   METABOLIC PANEL, COMPREHENSIVE    Collection Time: 01/07/18  3:00 PM   Result Value Ref Range    Sodium 140 136 - 145 mEq/L    Potassium 3.2 (L) 3.5 - 5.1  mEq/L    Chloride 107 98 - 107 mEq/L    CO2 25 21 - 32 mEq/L    Glucose 59 (L) 74 - 106 mg/dl    BUN 6 (L) 7 - 25 mg/dl    Creatinine 0.6 0.6 - 1.3 mg/dl    GFR est AA >60.0      GFR est non-AA >60      Calcium 8.6 8.5 - 10.1 mg/dl    AST (SGOT) 10 (L) 15 - 37 U/L    ALT (SGPT) 14 12 - 78 U/L    Alk. phosphatase 64 45 - 117 U/L    Bilirubin, total 0.2 0.2 - 1.0 mg/dl    Protein, total 6.5 6.4 - 8.2 gm/dl    Albumin 3.0 (L) 3.4 - 5.0 gm/dl    Anion gap 9 5 - 15 mmol/L   AMYLASE    Collection Time: 01/07/18  3:00 PM   Result Value Ref Range    Amylase, Total 40 25 - 115 U/L   LIPASE    Collection Time: 01/07/18  3:00 PM   Result Value Ref Range    Lipase 68 (L) 73 - 393 U/L   URINALYSIS W/ RFLX MICROSCOPIC    Collection Time: 01/07/18  3:20 PM   Result Value Ref Range    Color YELLOW YELLOW,STRAW      Appearance CLOUDY (A) CLEAR      Glucose NEGATIVE NEGATIVE,Negative mg/dl    Bilirubin SMALL (A) NEGATIVE,Negative      Ketone TRACE (A) NEGATIVE,Negative mg/dl    Specific gravity 1.025 1.005 - 1.030      Blood NEGATIVE NEGATIVE,Negative      pH (UA) 6.0 5.0 - 9.0      Protein TRACE (A) NEGATIVE,Negative mg/dl    Urobilinogen 1.0 0.0 - 1.0 mg/dl    Nitrites POSITIVE (A) NEGATIVE,Negative      Leukocyte Esterase SMALL (A) NEGATIVE,Negative     POC URINE MICROSCOPIC    Collection Time: 01/07/18  3:20 PM   Result Value Ref Range    Epithelial cells, squamous 15-29  /LPF    WBC 5-9 /HPF    Mucus PRESENT      Bacteria 4+ /HPF       Assessment and Plan:      Principal Problem:    History of recent fall (01/07/2018)    Active Problems:    Acute cystitis (01/07/2018)      Obesity affecting pregnancy, antepartum, second trimester (01/07/2018)      Limited prenatal care in second trimester (01/07/2018)      History of fall (01/07/2018)       1. S/p Fall  -[place] and labs normal  -NST q shift  -await KB  -will treat pain with IV tylenol and tramadol for breakthrough pain    2. N/v  -possibly from UTI. IV ancef 1g q12h until she can tolerate macrobid  -replace K+  -IV phenergan and zofran until she can tolerate PO    The last time she was here, we tried to restrict her diet and she became very angry and threatened to leave AMA. She continues to try to tolerate a regular diet although it is better to give the stomach a rest from fatty, greasy foods. (see note 8/22)      Signed By: Lacretia Leigh, MD     January 08, 2018

## 2018-01-09 MED ORDER — ERGOCALCIFEROL (VITAMIN D2) 50,000 UNIT CAP
1250 mcg (50,000 unit) | ORAL_CAPSULE | ORAL | 0 refills | Status: DC
Start: 2018-01-09 — End: 2019-11-05

## 2018-01-09 MED ORDER — ACETAMINOPHEN-CODEINE 300 MG-30 MG TAB
300-30 mg | ORAL_TABLET | ORAL | 0 refills | Status: AC | PRN
Start: 2018-01-09 — End: 2018-02-08

## 2018-01-09 MED ORDER — CEPHALEXIN 500 MG CAP
500 mg | ORAL_CAPSULE | Freq: Four times a day (QID) | ORAL | 0 refills | Status: DC
Start: 2018-01-09 — End: 2019-11-05

## 2018-01-09 MED ORDER — LEVOTHYROXINE 75 MCG TAB
75 mcg | ORAL_TABLET | ORAL | 0 refills | Status: AC
Start: 2018-01-09 — End: ?

## 2018-01-09 MED ORDER — PROMETHAZINE IN NS 25 MG/50 ML IV PIGGY BAG
25 mg/50 ml | Freq: Four times a day (QID) | INTRAVENOUS | Status: DC | PRN
Start: 2018-01-09 — End: 2018-01-09
  Administered 2018-01-09 (×2): via INTRAVENOUS

## 2018-01-09 MED ORDER — LABETALOL 200 MG TAB
200 mg | ORAL_TABLET | Freq: Two times a day (BID) | ORAL | 0 refills | Status: AC
Start: 2018-01-09 — End: ?

## 2018-01-09 MED ORDER — PROMETHAZINE 25 MG TAB
25 mg | ORAL_TABLET | Freq: Three times a day (TID) | ORAL | 0 refills | Status: DC | PRN
Start: 2018-01-09 — End: 2019-11-05

## 2018-01-09 MED FILL — BD POSIFLUSH NORMAL SALINE 0.9 % INJECTION SYRINGE: INTRAMUSCULAR | Qty: 10

## 2018-01-09 MED FILL — PROMETHAZINE IN NS 25 MG/50 ML IV PIGGY BAG: 25 mg/50 ml | INTRAVENOUS | Qty: 50

## 2018-01-09 MED FILL — FAMOTIDINE 20 MG TAB: 20 mg | ORAL | Qty: 1

## 2018-01-09 MED FILL — CEFAZOLIN 1 GRAM SOLUTION FOR INJECTION: 1 gram | INTRAMUSCULAR | Qty: 1000

## 2018-01-09 MED FILL — PRENATAL-U 106.5 MG-1 MG CAPSULE: ORAL | Qty: 1

## 2018-01-09 MED FILL — TRAMADOL 50 MG TAB: 50 mg | ORAL | Qty: 2

## 2018-01-09 MED FILL — ONDANSETRON (PF) 4 MG/2 ML INJECTION: 4 mg/2 mL | INTRAMUSCULAR | Qty: 2

## 2018-01-09 MED FILL — PROMETHAZINE 25 MG/ML INJECTION: 25 mg/mL | INTRAMUSCULAR | Qty: 1

## 2018-01-09 MED FILL — OFIRMEV 1,000 MG/100 ML (10 MG/ML) INTRAVENOUS SOLUTION: 1000 mg/100 mL (10 mg/mL) | INTRAVENOUS | Qty: 100

## 2018-01-09 MED FILL — ZOLPIDEM 5 MG TAB: 5 mg | ORAL | Qty: 1

## 2018-01-09 MED FILL — LEVOTHYROXINE 25 MCG TAB: 25 mcg | ORAL | Qty: 1

## 2018-01-09 MED FILL — LABETALOL 100 MG TAB: 100 mg | ORAL | Qty: 2

## 2018-01-09 NOTE — Other (Signed)
Phenergan IV hung for nausea. Pt back asleep, resp even  & regular,  while hanging med.

## 2018-01-09 NOTE — Discharge Summary (Signed)
[facility]  Discharge Summary   NAME:  Linda [name]  SEX:   F  ADMIT: 01/07/2018  DISCH:   DOB:   Apr 09, 1990  MR#    [id number]  ACCT#  [id number]    cc: . Marland Kitchen     HISTORY:  The patient is a 28 year old gravida 7, para 4, aborta 2 female.  She has come to our office on 2 occasions.  The first occasion she had her 2 children with her and had to leave.  On the second occasion she stated that she wanted to transfer her care.  The patient therefore has not been seen in our office since 11/13/2017.  It is our understanding that she has sought care elsewhere.  The patient presented to the antepartum unit on 10/23 complaining of falling on her right side at work.  She complained that she had pain on her right side and her lower back.  She also complained of vomiting and not being able to hold food down.    HOSPITAL COURSE:  The patient was admitted on 01/07/2018.  She complains that she is still having difficulty holding food down and she has some pain on her right side.  Her urinalysis was positive for nitrites, but her urine culture is not available as of yet.  When I went in the patient's room.  She was eating [place] toast and bacon.  Her abdomen is soft and nontender.  She does not have any CVA tenderness.  The patient states that she has not been taking her thyroid medication or her antihypertensive because she was nauseated and had been throwing them up and she has run out of medication.  The patient is not acutely ill at this point.  Therefore, she is being discharged to home with prescriptions to last her for 1 month.  Secondary to the patient's hypertension, thyroid disease and noncompliance her care is being transferred to the Maternal Fetal Medicine service at [facility].  We are assisting the patient in making her initial appointment through my office and should have an appointment date and time for her prior to her actually leaving the hospital.  The patient  understands that our practice will no longer be responsible for her care and if she has any future problems she is to contact the Maternal Fetal Medicine Service at [facility].  She is being discharged on a regular diet and light activity.      ___________________  Wynetta Fines MD  Dictated By: .   mad  D:01/09/2018 10:39:29  T: 01/09/2018 11:25:38  [id number]

## 2018-01-09 NOTE — Progress Notes (Addendum)
0845 Patient sleeping, in no apparent distress; arouses easily upon staff entry; VS obtained; medication per MAR; patient denies any new complaints at this time.     1010 Dr. Whitted at bedside reviewing discharge with patient; plan to transfer care to [facility] discussed with patient. Patient to follow up with [facility] post discharge for care.     1020 Reviewed discharge instructions with patient; patient requesting pain medication prescription with discharge; notified provider at this time.     1355 IV access removed; reviewed discharge instructions with patient, including follow up appointment, when to seek care, how to follow up; patient able to verbalize understanding. Written instructions, appointment information, and prescriptions provided at this time. Patient remains in bed, eating lunch and waiting for ride.     1506 Patient discharged home via wheelchair with family at side undelivered and in no apparent distress.

## 2018-01-09 NOTE — Other (Signed)
SBAR report to G. McPherson RN.

## 2018-01-09 NOTE — Other (Addendum)
Pt c/o nausea. States tolerated BLT for dinner and vomited earlier today x 1after breakfast, states she "threw up all the pills from this morning".Requesting antiemetic. Will obtain med from pharmacy.

## 2018-01-09 NOTE — Progress Notes (Signed)
0845 Patient sleeping, in no apparent distress; arouses easily upon staff entry; VS obtained; medication per [facility]; patient denies any new complaints at this time.     1010 Dr. Renita Papa at bedside reviewing discharge with patient; plan to transfer care to [facility] discussed with patient. Patient to follow up with [facility] post discharge for care.     1020 Reviewed discharge instructions with patient; patient requesting pain medication prescription with discharge; notified provider at this time.     1355 IV access removed; reviewed discharge instructions with patient, including follow up appointment, when to seek care, how to follow up; patient able to verbalize understanding. Written instructions, appointment information, and prescriptions provided at this time. Patient remains in bed, eating lunch and waiting for ride.     1506 Patient discharged home via wheelchair with family at side undelivered and in no apparent distress.

## 2018-01-09 NOTE — Discharge Summary (Signed)
[facility]  Discharge Summary   NAME:  Linda [name]  SEX:   F  ADMIT: 01/07/2018  DISCH:   DOB:   28-Feb-1991  MR#    [id number]  ACCT#  [id number]    cc: . Marland Kitchen     HISTORY:  The patient is a 27 year old gravida 7, para 4, aborta 2 female.  She has come to our office on 2 occasions.  The first occasion she had her 2 children with her and had to leave.  On the second occasion she stated that she wanted to transfer her care.  The patient therefore has not been seen in our office since 11/13/2017.  It is our understanding that she has sought care elsewhere.  The patient presented to the antepartum unit on 10/23 complaining of falling on her right side at work.  She complained that she had pain on her right side and her lower back.  She also complained of vomiting and not being able to hold food down.    HOSPITAL COURSE:  The patient was admitted on 01/07/2018.  She complains that she is still having difficulty holding food down and she has some pain on her right side.  Her urinalysis was positive for nitrites, but her urine culture is not available as of yet.  When I went in the patient's room.  She was eating [place] toast and bacon.  Her abdomen is soft and nontender.  She does not have any CVA tenderness.  The patient states that she has not been taking her thyroid medication or her antihypertensive because she was nauseated and had been throwing them up and she has run out of medication.  The patient is not acutely ill at this point.  Therefore, she is being discharged to home with prescriptions to last her for 1 month.  Secondary to the patient's hypertension, thyroid disease and noncompliance her care is being transferred to the Maternal Fetal Medicine service at [facility].  We are assisting the patient in making her initial appointment through my office and should have an appointment date and time for her prior to her actually leaving the hospital.  The patient understands  that our practice will no longer be responsible for her care and if she has any future problems she is to contact the Maternal Fetal Medicine Service at [facility].  She is being discharged on a regular diet and light activity.      ___________________  Wynetta Fines MD  Dictated By: .   mad  D:01/09/2018 10:39:29  T: 01/09/2018 11:25:38  [id number]

## 2018-11-03 ENCOUNTER — Inpatient Hospital Stay: Admit: 2018-11-03 | Discharge: 2018-11-03 | Payer: MEDICAID

## 2018-11-03 DIAGNOSIS — R079 Chest pain, unspecified: Secondary | ICD-10-CM

## 2019-04-20 ENCOUNTER — Inpatient Hospital Stay
Admit: 2019-04-20 | Discharge: 2019-04-20 | Disposition: A | Discharge status: Home / Self Care | Payer: MEDICAID | Attending: Obstetrics & Gynecology

## 2019-04-20 ENCOUNTER — Emergency Department: Admit: 2019-04-20 | Payer: MEDICAID

## 2019-04-20 DIAGNOSIS — O26892 Other specified pregnancy related conditions, second trimester: Secondary | ICD-10-CM

## 2019-04-20 LAB — URINALYSIS W/ RFLX MICROSCOPIC
Bilirubin, Urine: NEGATIVE
Bilirubin: NEGATIVE
Blood, Urine: NEGATIVE
Blood: NEGATIVE
Glucose, Ur: NEGATIVE mg/dl
Glucose: NEGATIVE mg/dl
Ketone: NEGATIVE mg/dl
Ketones, Urine: NEGATIVE mg/dl
Leukocyte Esterase, Urine: NEGATIVE
Leukocyte Esterase: NEGATIVE
Nitrite, Urine: NEGATIVE
Nitrites: NEGATIVE
Protein, UA: NEGATIVE mg/dl
Protein: NEGATIVE mg/dl
Specific Gravity, UA: 1.02 (ref 1.005–1.030)
Specific gravity: 1.02 (ref 1.005–1.030)
Urobilinogen, UA, POCT: 0.2 mg/dl (ref 0.0–1.0)
Urobilinogen: 0.2 mg/dl (ref 0.0–1.0)
pH (UA): 7.5 (ref 5.0–9.0)
pH, UA: 7.5 (ref 5.0–9.0)

## 2019-04-20 LAB — DRUG SCREEN, URINE
Amphetamine: NEGATIVE
Amphetamines: NEGATIVE
Barbiturates: NEGATIVE
Barbiturates: NEGATIVE
Benzodiazapines: NEGATIVE
Benzodiazepines: NEGATIVE
Cocaine: NEGATIVE
Cocaine: NEGATIVE
Marijuana: POSITIVE — AB
Marijuana: POSITIVE — AB
Methadone: NEGATIVE
Methadone: NEGATIVE
Opiates: NEGATIVE
Opiates: NEGATIVE
Phencyclidine: NEGATIVE
Phencyclidine: NEGATIVE

## 2019-04-20 LAB — WET PREP

## 2019-04-20 MED ORDER — METRONIDAZOLE 500 MG TAB
500 mg | ORAL_TABLET | Freq: Two times a day (BID) | ORAL | 0 refills | Status: DC
Start: 2019-04-20 — End: 2019-04-20

## 2019-04-20 MED ORDER — METRONIDAZOLE 500 MG TAB
500 mg | ORAL_TABLET | Freq: Two times a day (BID) | ORAL | 0 refills | Status: AC
Start: 2019-04-20 — End: 2019-04-27

## 2019-04-20 MED ORDER — ACETAMINOPHEN 500 MG TAB
500 mg | Freq: Once | ORAL | Status: AC
Start: 2019-04-20 — End: 2019-04-20
  Administered 2019-04-20: 19:00:00 via ORAL

## 2019-04-20 MED ORDER — ACETAMINOPHEN 500 MG TAB
500 mg | Freq: Once | ORAL | Status: DC
Start: 2019-04-20 — End: 2019-04-20

## 2019-04-20 MED FILL — ACETAMINOPHEN 500 MG TAB: 500 mg | ORAL | Qty: 2

## 2019-04-20 NOTE — Other (Addendum)
Patient refusing to sign discharge papers (prescription was given to patient), saying that (she) "won't sign anything because (we) didn't do anything for her".  Patient upset and yelling, swearing, threatening to go find MD and "pop" her for "insulting" her (MD advised patient that she may be tired with this being her 6th pregnancy, and that her weight and BMI may be causing her chronic back pain, and that she was unable to find any OB problems after [place], and labs were done today), and that she is still in pain from her back and that "nothing was done' for her.  Patient screaming that (she) "won't come back here", we were "extremely rude".  Attempts made to calm patient, and explain that she needs to seek primary care for her medical concerns to have comprehensive and consistent plan of care for herself, and in this pregnancy.  Patient declines any attempt to resolve issue, saying she is just going to call her mom to come get her.  Patient again refusing to sign discharge.    Charge RN, Marquette Old, made aware.

## 2019-04-20 NOTE — ED Triage Notes (Signed)
This G8P5 patient, at 26 weeks (by US at [place]), presents to the unit c/o continuous back pain following a fall up the stairs this morning.  Patient states she has had back pain for weeks, and was seen at [facility] 3 weeks ago where they told her she had kidney stones, but did not "do anything" for her.  Patient states pain was exacerbated by her fall on the stairs.  Patient did not fall down the stairs, but was walking up them.  Patient also reports cramping since the fall, with some spotting at that time, which is now resolved.  Patient does not have prenatal care with this pregnancy.  Patient has hx of hypertension, sob, hypothyroidism, which she should be taking medication for, but has run out and does not have a primary care physician.  Patient has hx of GDM in previous pregnancies, and was being tested for DM type 2, but did not follow through with the testing.  Patient rates pain 10/10.

## 2019-04-20 NOTE — ED Provider Notes (Addendum)
[facility]  Obstetric[facility] Emergency Department Treatment Note    Patient: Linda [name] Age: 29 y.o. Sex: female    Date of Birth: 1990-11-06 Admit Date: 04/20/2019 PCP: None   MRN: [id number]  CSN: [id number]     Room: 3112/3112 Time Dictated: 10:43 AM      Subjective:     Reason for Evaluation:  Pregnancy and s/p fall    History of Present Illness: Linda [name] is a 29 y.o. [id number] with an estimated gestational age of 1231w0d by first trimester ultrasound (done 12/05/18) with Estimated Date of Delivery: 07/27/19. This is a patient without prenatal care who presents to OBED complaining of low back pain for 3+ weeks. This was exacerbated by a fall (at 0630) while going up the stairs this morning. She did not hit her abdomen but hit her left side and back. Denies any radiation of the back pain. Had some lower abdominal cramping and spotting after the fall but this has since resolved. Denies dysuria or urgency but complains of increased frequency and feeling as if "I have to push something out" when voiding. Denies abnormal vaginal discharge, odor, pruritus, or irritation. No recent intercourse. Denies N/V/CP/SOB/F/C/sick contacts.     Prenatal course complicated by:  - no [facility] care[facility] 764[facility]1d, PN labs obtained at that time  - chronic HTN, previous labetalol use  - hypothyroidism, previously on synthroid 75mcg daily, no meds since 10/2018, TSH 1.42 wnl 03/24/19  - bipolar disorder  - Hb C trait  - h/o pyelonephritis in previous pregnancy  - h/o A2GDM in prior pregnancy  - obesity, BMI 53    GYN Hx:  - chlamydia 2014, 2020  - Pap ASCUS 03/2017    Prenatal Labs:  O(+)  Antibody screen neg  Rubella immune  RPR NR  Hep B neg  HIV neg  H/H 10.6/31.7  PLT 262  GBS neg  GC/CT neg/neg      OB History   Gravida Para Term Preterm AB Living   9 5 5   3 5    SAB TAB Ectopic Molar Multiple Live Births   2 1     0 5      # Outcome Date GA Lbr Len/2nd Weight Sex Delivery Anes PTL Lv    9 Current            8 Term 2020 2942w1d  3.315 kg F Vag-Spont   LIV   7 Term 2017 8246w6d   M    LIV      Birth Comments: polyhydramnios, unilateral renal agenesis   6 Term 10/25/14 6627w1d  3.09 kg M VAGINAL DELI EPIDURAL AN N LIV   5 TAB 2016 4935w0d             Birth Comments: D&C   4 SAB 2015 5555w5d    SAB         Birth Comments: System Generated. Please review and update pregnancy details.   3 Term 03/12/13 3974w3d  3.6 kg M VAGINAL DELI EPIDURAL AN N LIV   2 Term 08/28/11 4054w2d  3.547 kg F VAGINAL DELI EPIDURAL AN N LIV   1 SAB 2013 5181w0d    SAB        Past Medical History:   Diagnosis Date   ??? ADHD (attention deficit hyperactivity disorder)    ??? Bipolar disorder (HCC)    ??? BV (bacterial vaginosis)     On Several Occasions   ??? Chlamydia  04/25/2011    07/07/14, 04/12/14, 02/14/14, 07/03/12, 04/25/11    ??? Depression    ??? Essential hypertension    ??? Gestational diabetes    ??? Gonorrhea 01/27/2013    07/07/14, 02/14/14, 01/27/13   ??? Headache 03/10/2014    CT Head W/O Contrast: Normal   ??? Hypokalemia 05/22/2011    3.4 mmol/L   ??? Hypomagnesemia 03/09/2014    1.5 mg/dL   ??? Hyponatremia 01/03/2011    132 mmol/L   ??? Hypothyroidism    ??? Microcytic anemia 01/01/2008   ??? Morbid obesity ([place])    ??? OCD (obsessive compulsive disorder)    ??? Recurrent UTI    ??? Sacroiliitis ([place]) 11/12/2011    Noted on Lumbar X-ray: Left Sided Sacroliitis    ??? Strep throat 05/22/2011    + RS   ??? Vision decreased      Past Surgical History:   Procedure Laterality Date   ??? HX DILATION AND CURETTAGE  09/2013     Social History     Occupational History   ??? Not on file   Tobacco Use   ??? Smoking status: Never Smoker   ??? Smokeless tobacco: Never Used   Substance and Sexual Activity   ??? Alcohol use: Not Currently   ??? Drug use: Yes     Types: Marijuana   ??? Sexual activity: Yes     Partners: Male     Birth control/protection: None     Family History   Problem Relation Age of Onset   ??? Hypertension Maternal Grandmother    ??? Thyroid Disease Maternal Grandmother     ??? Diabetes Maternal Grandmother    ??? Hypertension Maternal Grandfather    ??? Hypertension Paternal Grandfather    ??? Hypertension Mother    ??? Asthma Mother    ??? Diabetes Mother    ??? Thyroid Disease Brother    ??? Diabetes Brother    ??? No Known Problems Sister    ??? Diabetes Father    ??? Diabetes Paternal Grandmother    ??? Thyroid Disease Maternal Aunt        Allergies   Allergen Reactions   ??? Percocet [Oxycodone-Acetaminophen] Nausea and Vomiting     Prior to Admission medications    Medication Sig Start Date End Date Taking? Authorizing Provider   metroNIDAZOLE (FlagyL) 500 mg tablet Take 1 Tab by mouth two (2) times a day for 7 days. 04/20/19 04/27/19 Yes Vernon Ariel, Fraser Din, MD   labetalol (NORMODYNE) 200 mg tablet Take 1 Tab by mouth two (2) times a day. Indications: high blood pressure 01/09/18   Whitted, James Ivanoff, MD   levothyroxine (SYNTHROID) 75 mcg tablet Take 1 Tab by mouth every morning. 01/10/18   Whitted, James Ivanoff, MD   promethazine (PHENERGAN) 25 mg tablet Take 1 Tab by mouth every eight (8) hours as needed for Nausea. 01/09/18   Whitted, James Ivanoff, MD   cephALEXin (KEFLEX) 500 mg capsule Take 1 Cap by mouth four (4) times daily. Indications: Bacterial Urinary Tract Infection 01/09/18   Whitted, James Ivanoff, MD   ergocalciferol (VITAMIN D2) 50,000 unit capsule Take 1 Cap by mouth every seven (7) days. 01/09/18   Whitted, James Ivanoff, MD   albuterol (PROVENTIL HFA, VENTOLIN HFA, PROAIR HFA) 90 mcg/actuation inhaler Take 2 Puffs by inhalation every four (4) hours as needed for Wheezing. 11/27/17   Finis Bud, NP        Review of Systems   Constitutional: Negative.  HENT: Negative.    Eyes: Negative.    Respiratory: Negative.    Cardiovascular: Negative.    Gastrointestinal: Negative.    Endocrine: Negative.     Genitourinary: Positive for frequency and vaginal bleeding (spotting). Negative for decreased urine volume, difficulty urinating, dyspareunia, dysuria, enuresis, flank pain, genital sores, hematuria, menstrual problem, pelvic pain, urgency, vaginal discharge and vaginal pain.   Musculoskeletal: Positive for back pain. Negative for arthralgias, gait problem, joint swelling, myalgias, neck pain and neck stiffness.   Skin: Negative.    Allergic/Immunologic: Negative.    Neurological: Negative.    Hematological: Negative.    Psychiatric/Behavioral: Negative.    All other systems reviewed and are negative.      Objective:     Vitals:    Vitals:    04/20/19 1046   BP: (!) 118/50   Pulse: 75   Resp: 18   Temp: 97.5 ??F (36.4 ??C)   SpO2: 100%   Weight: 145.2 kg (320 lb)   Height: 5\' 5"  (1.651 m)      Temp (24hrs), Avg:97.5 ??F (36.4 ??C), Min:97.5 ??F (36.4 ??C), Max:97.5 ??F (36.4 ??C)    BP  Min: 118/50  Max: 118/50     Physical Exam  Vitals signs and nursing note reviewed. Exam conducted with a chaperone present.   Constitutional:       General: She is not in acute distress.     Appearance: She is well-developed. She is obese. She is not ill-appearing, toxic-appearing or diaphoretic.   HENT:      Head: Normocephalic and atraumatic.   Neck:      Musculoskeletal: Normal range of motion and neck supple.   Cardiovascular:      Rate and Rhythm: Normal rate and regular rhythm.      Heart sounds: Normal heart sounds. No murmur. No friction rub. No gallop.    Pulmonary:      Effort: Pulmonary effort is normal. No respiratory distress.      Breath sounds: Normal breath sounds. No wheezing or rales.   Chest:      Chest wall: No tenderness.   Abdominal:      General: Bowel sounds are normal. There is no distension.      Palpations: Abdomen is soft. There is no mass.      Tenderness: There is no abdominal tenderness. There is no right CVA tenderness, left CVA tenderness, guarding or rebound.   Genitourinary:     General: Normal vulva.       Exam position: Lithotomy position.      Labia:         Right: No rash, tenderness, lesion or injury.         Left: No rash, tenderness, lesion or injury.       Vagina: No signs of injury and foreign body. Vaginal discharge (white homogenous) and bleeding (no blood in vault) present. No erythema or tenderness.      Cervix: No cervical motion tenderness, discharge, friability, lesion, erythema or cervical bleeding.      Uterus: Enlarged (gravid). Not tender.    Musculoskeletal: Normal range of motion.         General: No tenderness or deformity.      Right lower leg: No edema.      Left lower leg: No edema.   Skin:     General: Skin is warm and dry.      Findings: No erythema.   Neurological:      Mental Status: She  is alert and oriented to person, place, and time.      Deep Tendon Reflexes: Reflexes normal.   Psychiatric:         Behavior: Behavior normal.         Cervical Exam: closed/thick  Uterine Activity: none  Membranes: intact  Fetal Heart Rate: 130s, unable to perform NST due to EGA and maternal body habitus      Status: Final result (Exam End: 04/20/2019 11:20) Provider Status: Open   Study Result    Clinical History:  Status post fall. No prenatal care.  ??  Examination: Obstetric ultrasound.  ??  Findings:  There is a single live intrauterine pregnancy in breech presentation. The  placenta is anterior in location. Amniotic fluid index is 10.9 (Normal 5-25).  The single deepest pocket is 3.9.   ??  Fetal cardiac activity is 146 beats per minute.  ??  Biparietal diameter is 61 mm corresponding to gestational age of  [redacted] weeks 0  days.  Head circumference measures 246 mm corresponding to a gestational age of [redacted]  weeks 5 days.  Abdominal circumference measures 240 mm corresponding to gestational age of [redacted]  weeks 2 days.  Femoral length measures 52 mm corresponding to gestational age of [redacted] weeks 6  days.  Estimated fetal weight is 1113 g.  ??  Head Circumference/Abdominal Circumference: 1.03.  ??   Based on last menstrual cycle, gestational age is 26 weeks 0 days.  Based on  ultrasound fetal age is 27 weeks 0 days.  Expected date of confinement is  07/20/2019 based on ultrasound.  ??  IMPRESSION  Impression:   1.  Single live intrauterine pregnancy mean gestational age [redacted] weeks 0 days  based on this ultrasound.  2.  Normal AFI of 10.9 cm.  3.  Anterior placenta with no evidence of previa.       Labs:   Recent Results (from the past 24 hour(s))   DRUG SCREEN, URINE    Collection Time: 04/20/19 11:35 AM   Result Value Ref Range    Amphetamine NEGATIVE NEGATIVE      Barbiturates NEGATIVE NEGATIVE      Benzodiazepines NEGATIVE NEGATIVE      Cocaine NEGATIVE NEGATIVE      Marijuana POSITIVE (A) NEGATIVE      Methadone NEGATIVE NEGATIVE      Opiates NEGATIVE NEGATIVE      Phencyclidine NEGATIVE NEGATIVE     URINALYSIS W/ RFLX MICROSCOPIC    Collection Time: 04/20/19 11:35 AM   Result Value Ref Range    Color DARK YELLOW (A) YELLOW,STRAW      Appearance CLOUDY (A) CLEAR      Glucose NEGATIVE NEGATIVE,Negative mg/dl    Bilirubin NEGATIVE NEGATIVE,Negative      Ketone NEGATIVE NEGATIVE,Negative mg/dl    Specific gravity 1.610 1.005 - 1.030      Blood NEGATIVE NEGATIVE,Negative      pH (UA) 7.5 5.0 - 9.0      Protein NEGATIVE NEGATIVE,Negative mg/dl    Urobilinogen 0.2 0.0 - 1.0 mg/dl    Nitrites NEGATIVE NEGATIVE,Negative      Leukocyte Esterase NEGATIVE NEGATIVE,Negative     WET PREP    Collection Time: 04/20/19 11:56 AM    Specimen: Vaginal Specimen; Genital   Result Value Ref Range    Wet prep        Clue Cells Seen  No Yeast or motile Trichomonas seen         Assessment and Plan:  29 y.o. [id number] @ [redacted]w[redacted]d    Encounter Diagnoses     ICD-10-CM ICD-9-CM   1. Traumatic injury during pregnancy in second trimester  O9A.212 648.93     959.9   2. Blood type, Rh positive  Z67.90 V49.89   3. Obesity, morbid, BMI 50 or higher (HCC)  E66.01 278.01   4. Chronic hypertension during pregnancy  O10.919 642.00    5. Pregnancy complicated by maternal drug use, antepartum, second trimester  O99.322 648.43     305.90   6. Bacterial vaginosis in pregnancy  O23.599 648.90    B96.89 616.10   7. History of gestational diabetes in prior pregnancy, currently pregnant in second trimester  O09.292 V23.49    Z86.32    8. Back pain affecting pregnancy in second trimester  O99.891 646.83    M54.9 724.5     Unable to perform NST due to EGA and maternal body habitus  BPP 8/8  UA reassuring, c/w dehydration  Encouraged increased water intake  WM with clue cells  Rx flagyl. Safe/effective use of medication reviewed.  Discouraged any further drug use  Needs to establish PN care ASAP    Neoma Laming, MD, [facility]    My signature above authenticates this document and my orders, the final    diagnosis(es), discharge prescription(s), and instructions in the Epic    record.  If you have any questions please contact [phone].  Nursing notes have been reviewed by myself.

## 2019-04-20 NOTE — ED Provider Notes (Signed)
ED Provider Notes by Ardell Isaacs, MD at 04/20/19 1043                Author: Ardell Isaacs, MD  Service: Obstetrics & Gynecology  Author Type: Physician       Filed: 04/20/19 1340  Date of Service: 04/20/19 1043  Status: Addendum          Editor: Ardell Isaacs, MD (Physician)          Related Notes: Original Note by Ardell Isaacs, MD (Physician) filed at 04/20/19 1332                      [facility]   Obstetrics Emergency Department Treatment Note          Patient: Linda [name]  Age: 30 y.o.  Sex: female          Date of Birth: [birth year]  Admit Date: 04/20/2019  PCP: None     MRN: [id number]   CSN: [id number]            Room: 3112/3112  Time Dictated: 10:43 AM            Subjective:        Reason for Evaluation:  Pregnancy and s/p fall      History of Present Illness: Linda [name] is a  29 y.o. [id number] with an estimated gestational age of [redacted]w[redacted]d by first trimester ultrasound (done 12/05/18) with Estimated Date of Delivery: 07/27/19. This is a patient without prenatal care who presents  to OB[facility] complaining of low back pain for 3+ weeks. This was exacerbated by a fall (at 0630) while going up the stairs this morning. She did not hit her abdomen but hit her left side and back. Denies any radiation of the back pain. Had some lower abdominal  cramping and spotting after the fall but this has since resolved. Denies dysuria or urgency but complains of increased frequency and feeling as if "I have to push something out" when voiding. Denies abnormal vaginal discharge, odor, pruritus, or irritation.  No recent intercourse. Denies N/V/CP/SOB/F/C/sick contacts.       Prenatal course complicated by:   - no prenatal care, seen at [facility] 03/24/19 @ [redacted]w[redacted]d, PN labs obtained at that time   - chronic HTN, previous labetalol use   - hypothyroidism, previously on synthroid daily, no meds since 10/2018, TSH 1.42 wnl 03/24/19   - bipolar disorder   - Hb C trait   - h/o pyelonephritis in  previous pregnancy   - h/o A2GDM in prior pregnancy   - obesity, BMI 53      GYN Hx:   - chlamydia 2014, 2020   - Pap ASCUS 03/2017      Prenatal Labs:   O(+)   Antibody screen neg   Rubella immune   RPR NR   Hep B neg   HIV neg   H/H 10.6/31.7   PLT 262   GBS neg   GC/CT neg/neg           OB History            Gravida  Para  Term  Preterm  AB  Living            9  5  5     3  5             SAB  TAB  Ectopic  Molar  Multiple  Live  Births            2  1        0  5                        #  Outcome  Date  GA  Lbr Len/2nd  Weight  Sex  Delivery  Anes  PTL  Lv                 9  Current                                   8  Term  2020  7026w1d    3.315 kg  F  Vag-Spont      LIV                 7  Term  2017  6664w6d      M        LIV          Birth Comments: polyhydramnios, unilateral renal agenesis                 6  Term  10/25/14  6781w1d    3.09 kg  M  VAGINAL DELI  EPIDURAL AN  N  LIV                 5  TAB  2016  6461w0d                        Birth Comments: D&C                 4  SAB  2015  2451w5d        SAB                Birth Comments: System Generated. Please review and update pregnancy details.                 3  Term  03/12/13  7065w3d    3.6 kg  M  VAGINAL DELI  EPIDURAL AN  N  LIV                 2  Term  08/28/11  6348w2d    3.547 kg  F  VAGINAL DELI  EPIDURAL AN  N  LIV                 1  SAB  2013  7135w0d        SAB                Past Medical History:        Diagnosis  Date         ?  ADHD (attention deficit hyperactivity disorder)       ?  Bipolar disorder (HCC)       ?  BV (bacterial vaginosis)            On Several Occasions         ?  Chlamydia  04/25/2011          07/07/14, 04/12/14, 02/14/14, 07/03/12, 04/25/11          ?  Depression       ?  Essential hypertension       ?  Gestational diabetes       ?  Gonorrhea  01/27/2013          07/07/14, 02/14/14, 01/27/13         ?  Headache  03/10/2014          CT Head W/O Contrast: Normal         ?  Hypokalemia  05/22/2011          3.4 mmol/L         ?  Hypomagnesemia   03/09/2014          1.5 mg/dL         ?  Hyponatremia  01/03/2011          132 mmol/L         ?  Hypothyroidism       ?  Microcytic anemia  01/01/2008     ?  Morbid obesity ([place])       ?  OCD (obsessive compulsive disorder)       ?  Recurrent UTI       ?  Sacroiliitis ([place])  11/12/2011          Noted on Lumbar X-ray: Left Sided Sacroliitis          ?  Strep throat  05/22/2011          + RS         ?  Vision decreased            Past Surgical History:         Procedure  Laterality  Date          ?  HX DILATION AND CURETTAGE    09/2013          Social History          Occupational History        ?  Not on file       Tobacco Use         ?  Smoking status:  Never Smoker     ?  Smokeless tobacco:  Never Used       Substance and Sexual Activity         ?  Alcohol use:  Not Currently     ?  Drug use:  Yes              Types:  Marijuana         ?  Sexual activity:  Yes              Partners:  Male              Birth control/protection:  None          Family History         Problem  Relation  Age of Onset          ?  Hypertension  Maternal Grandmother       ?  Thyroid Disease  Maternal Grandmother       ?  Diabetes  Maternal Grandmother       ?  Hypertension  Maternal Grandfather       ?  Hypertension  Paternal Grandfather       ?  Hypertension  Mother       ?  Asthma  Mother       ?  Diabetes  Mother       ?  Thyroid Disease  Brother       ?  Diabetes  Brother       ?  No  Known Problems  Sister       ?  Diabetes  Father       ?  Diabetes  Paternal Grandmother            ?  Thyroid Disease  Maternal Aunt               Allergies        Allergen  Reactions         ?  Percocet [Oxycodone-Acetaminophen]  Nausea and Vomiting          Prior to Admission medications             Medication  Sig  Start Date  End Date  Taking?  Authorizing Provider            metroNIDAZOLE (FlagyL) 500 mg tablet  Take 1 Tab by mouth two (2) times a day for 7 days.  04/20/19  04/27/19  Yes  Ciera Beckum, Joyce CopaJessica L, MD     labetalol (NORMODYNE) 200 mg tablet  Take  1 Tab by mouth two (2) times a day. Indications: high blood pressure  01/09/18      Whitted, Katina DungMatthew L, MD            levothyroxine (SYNTHROID) 75 mcg tablet  Take 1 Tab by mouth every morning.  01/10/18      Whitted, Katina DungMatthew L, MD            promethazine (PHENERGAN) 25 mg tablet  Take 1 Tab by mouth every eight (8) hours as needed for Nausea.  01/09/18      Whitted, Katina DungMatthew L, MD     cephALEXin (KEFLEX) 500 mg capsule  Take 1 Cap by mouth four (4) times daily. Indications: Bacterial Urinary Tract Infection  01/09/18      Whitted, Katina DungMatthew L, MD     ergocalciferol (VITAMIN D2) 50,000 unit capsule  Take 1 Cap by mouth every seven (7) days.  01/09/18      Whitted, Katina DungMatthew L, MD            albuterol (PROVENTIL HFA, VENTOLIN HFA, PROAIR HFA) 90 mcg/actuation inhaler  Take 2 Puffs by inhalation every four (4) hours as needed for Wheezing.  11/27/17      Vonna KotykBlandford, Kristen K, NP            Review of Systems    Constitutional: Negative.     HENT: Negative.     Eyes: Negative.     Respiratory: Negative.     Cardiovascular: Negative.     Gastrointestinal: Negative.     Endocrine: Negative.     Genitourinary: Positive for frequency and vaginal bleeding  (spotting). Negative for decreased urine volume, difficulty urinating, dyspareunia, dysuria, enuresis, flank pain, genital sores, hematuria,  menstrual problem, pelvic pain, urgency, vaginal discharge and vaginal pain.    Musculoskeletal: Positive for back pain. Negative for arthralgias, gait problem, joint swelling, myalgias, neck pain and neck stiffness.     Skin: Negative.     Allergic/Immunologic: Negative.     Neurological: Negative.     Hematological: Negative.     Psychiatric/Behavioral: Negative.     All other systems reviewed and are negative.           Objective:        Vitals:       Vitals:          04/20/19 1046        BP:  (!) 118/50     Pulse:  75     Resp:  18     Temp:  97.5 ??F (36.4 ??C)     SpO2:  100%     Weight:  145.2 kg (320 lb)        Height:  5\' 5"  (1.651  m)         Temp (24hrs), Avg:97.5 ??F (36.4 ??C), Min:97.5 ??F (36.4 ??C), Max:97.5 ??F (36.4 ??C)      BP  Min: 118/50  Max: 118/50       Physical Exam   Vitals signs and nursing note reviewed. Exam conducted with a chaperone present.   Constitutional:        General: She is not in acute distress.     Appearance: She is well-developed. She is obese. She is not ill-appearing, toxic-appearing  or diaphoretic.   HENT :       Head: Normocephalic and atraumatic.   Neck:       Musculoskeletal: Normal range of motion and neck supple.   Cardiovascular :       Rate and Rhythm: Normal rate and regular rhythm.      Heart sounds: Normal heart sounds. No murmur. No friction rub. No gallop.     Pulmonary:       Effort: Pulmonary effort is normal. No respiratory distress.      Breath sounds: Normal breath sounds. No wheezing or rales.   Chest:       Chest wall: No tenderness.   Abdominal :      General: Bowel sounds are normal. There is no distension.      Palpations: Abdomen is soft. There is no mass.      Tenderness: There is no abdominal tenderness. There is no right CVA tenderness, left CVA tenderness, guarding or rebound.    Genitourinary :      General: Normal vulva.      Exam position: Lithotomy position.      Labia:         Right: No rash, tenderness, lesion or injury.         Left: No rash, tenderness, lesion or injury.       Vagina: No signs of injury and foreign body.  Vaginal discharge (white homogenous) and  bleeding (no blood in vault) present. No erythema or tenderness.      Cervix:  No cervical motion tenderness, discharge, friability, lesion, erythema or cervical bleeding.      Uterus: Enlarged  (gravid). Not tender.     Musculoskeletal: Normal range of motion.          General: No tenderness or deformity.      Right lower leg: No edema.      Left lower leg: No edema.    Skin:      General: Skin is warm and dry.      Findings: No erythema.    Neurological:       Mental Status: She is alert and oriented to person,  place, and time.      Deep Tendon Reflexes: Reflexes normal.    Psychiatric:         Behavior: Behavior normal.             Cervical Exam: closed/thick   Uterine Activity: none   Membranes: intact   Fetal Heart Rate: 130s, unable to perform NST due to EGA and maternal body habitus           Status: Final result (Exam End: 04/20/2019 11:20)  Provider Status:  Open     Study Result        Clinical History:  Status post fall. No prenatal care.   ??   Examination: Obstetric ultrasound.   ??   Findings:   There is a single live intrauterine pregnancy in breech presentation. The   placenta is anterior in location. Amniotic fluid index is 10.9 (Normal 5-25).   The single deepest pocket is 3.9.    ??   Fetal cardiac activity is 146 beats per minute.   ??   Biparietal diameter is 61 mm corresponding to gestational age of  [redacted] weeks 0   days.   Head circumference measures 246 mm corresponding to a gestational age of [redacted]   weeks 5 days.   Abdominal circumference measures 240 mm corresponding to gestational age of [redacted]   weeks 2 days.   Femoral length measures 52 mm corresponding to gestational age of [redacted] weeks 6   days.   Estimated fetal weight is 1113 g.   ??   Head Circumference/Abdominal Circumference: 1.03.   ??   Based on last menstrual cycle, gestational age is 26 weeks 0 days.  Based on   ultrasound fetal age is 27 weeks 0 days.  Expected date of confinement is   07/20/2019 based on ultrasound.   ??   IMPRESSION   Impression:    1.  Single live intrauterine pregnancy mean gestational age [redacted] weeks 0 days   based on this ultrasound.   2.  Normal AFI of 10.9 cm.   3.  Anterior placenta with no evidence of previa.           Labs:      Recent Results (from the past 24 hour(s))     DRUG SCREEN, URINE          Collection Time: 04/20/19 11:35 AM         Result  Value  Ref Range            Amphetamine  NEGATIVE  NEGATIVE         Barbiturates  NEGATIVE  NEGATIVE         Benzodiazepines  NEGATIVE  NEGATIVE         Cocaine  NEGATIVE  NEGATIVE          Marijuana  POSITIVE (A)  NEGATIVE         Methadone  NEGATIVE  NEGATIVE         Opiates  NEGATIVE  NEGATIVE         Phencyclidine  NEGATIVE  NEGATIVE         URINALYSIS W/ RFLX MICROSCOPIC          Collection Time: 04/20/19 11:35 AM         Result  Value  Ref Range            Color  DARK YELLOW (A)  YELLOW,STRAW         Appearance  CLOUDY (A)  CLEAR         Glucose  NEGATIVE  NEGATIVE,Negative mg/dl       Bilirubin  NEGATIVE  NEGATIVE,Negative         Ketone  NEGATIVE  NEGATIVE,Negative mg/dl       Specific gravity  1.020  1.005 - 1.030         Blood  NEGATIVE  NEGATIVE,Negative         pH (UA)  7.5  5.0 - 9.0  Protein  NEGATIVE  NEGATIVE,Negative mg/dl       Urobilinogen  0.2  0.0 - 1.0 mg/dl       Nitrites  NEGATIVE  NEGATIVE,Negative         Leukocyte Esterase  NEGATIVE  NEGATIVE,Negative         WET PREP          Collection Time: 04/20/19 11:56 AM       Specimen: Vaginal Specimen; Genital         Result  Value  Ref Range            Wet prep                  Clue Cells Seen   No Yeast or motile Trichomonas seen                Assessment and Plan:        29 y.o. [id number] @ [redacted]w[redacted]d        Encounter Diagnoses              ICD-10-CM  ICD-9-CM          1.  Traumatic injury during pregnancy in second trimester   O9A.212  648.93             959.9          2.  Blood type, Rh positive   Z67.90  V49.89     3.  Obesity, morbid, BMI 50 or higher (HCC)   E66.01  278.01     4.  Chronic hypertension during pregnancy   O10.919  642.00     5.  Pregnancy complicated by maternal drug use, antepartum, second trimester   O99.322  648.43             305.90          6.  Bacterial vaginosis in pregnancy   O23.599  648.90           B96.89  616.10          7.  History of gestational diabetes in prior pregnancy, currently pregnant in second trimester   O09.292  V23.49           Z86.32            8.  Back pain affecting pregnancy in second trimester   O99.891  646.83           M54.9  724.5        Unable to perform NST due to EGA and  maternal body habitus   BPP 8/8   UA reassuring, c/w dehydration   Encouraged increased water intake   WM with clue cells   Rx flagyl. Safe/effective use of medication reviewed.   Discouraged any further drug use   Needs to establish PN care ASAP      Neoma Laming, MD, [facility]      My signature above authenticates this document and my orders, the final     diagnosis(es), discharge prescription(s), and instructions in the Epic     record.   If you have any questions please contact [phone].   Nursing notes have been reviewed by myself.

## 2019-04-20 NOTE — ED Notes (Signed)
 This G8P5 patient, at 26 weeks (by US  at [facility]), presents to the unit c/o continuous back pain following a fall up the stairs this morning.  Patient states she has had back pain for weeks, and was seen at [facility] 3 weeks ago where they told her she had kidney stones, but did not do anything for her.  Patient states pain was exacerbated by her fall on the stairs.  Patient did not fall down the stairs, but was walking up them.  Patient also reports cramping since the fall, with some spotting at that time, which is now resolved.  Patient does not have prenatal care with this pregnancy.  Patient has hx of hypertension, sob, hypothyroidism, which she should be taking medication for, but has run out and does not have a primary care physician.  Patient has hx of GDM in previous pregnancies, and was being tested for DM type 2, but did not follow through with the testing.  Patient rates pain 10/10.

## 2019-11-04 ENCOUNTER — Inpatient Hospital Stay
Admit: 2019-11-04 | Discharge: 2019-11-05 | Disposition: A | Discharge status: Home / Self Care | Payer: MEDICAID | Attending: Emergency Medicine

## 2019-11-04 ENCOUNTER — Emergency Department: Admit: 2019-11-05 | Payer: MEDICAID

## 2019-11-04 DIAGNOSIS — U071 COVID-19: Secondary | ICD-10-CM

## 2019-11-04 NOTE — ED Notes (Signed)
Patient A/O x 4, presented to the ED with complaint of SOB, cough, chills x yesterday. Patient states she's unable to take a deep breath.

## 2019-11-04 NOTE — ED Provider Notes (Signed)
EMERGENCY DEPARTMENT HISTORY AND PHYSICAL EXAM    9:31 PM  Date: 11/04/2019  Patient Name: Linda [name]    History of Presenting Illness     Chief Complaint   Patient presents with   ??? Shortness of Breath   ??? Fever   ??? Cough        History Provided By: Patient    HPI: Linda [name] is a 29 y.o. female with history of hypertension, asthma and bipolar disorder.  Patient is presenting with fever, chills and body aches for 1 day.  Also complaining of shortness of breath and nonproductive cough.  Denies chest pain.  No history of GI symptoms.  Patient denies sick contacts but is also not vaccinated against COVID-19.  No history of previous VTE or oral birth control use.  Her son is immune compromised and she wanted to get the rapid Covid testing.    Location:  Severity:  Timing/course:   Onset/Duration:     PCP: None    Past History     Past Medical History:  Past Medical History:   Diagnosis Date   ??? ADHD (attention deficit hyperactivity disorder)    ??? Bipolar disorder ([place])    ??? BV (bacterial vaginosis)     On Several Occasions   ??? Chlamydia 04/25/2011    07/07/14, 04/12/14, 02/14/14, 07/03/12, 04/25/11    ??? Depression    ??? Essential hypertension    ??? Gestational diabetes    ??? Gonorrhea 01/27/2013    07/07/14, 02/14/14, 01/27/13   ??? Headache 03/10/2014    CT Head W/O Contrast: Normal   ??? Hypokalemia 05/22/2011    3.4 mmol/L   ??? Hypomagnesemia 03/09/2014    1.5 mg/dL   ??? Hyponatremia 01/03/2011    132 mmol/L   ??? Hypothyroidism    ??? Microcytic anemia 01/01/2008   ??? Morbid obesity ([place])    ??? OCD (obsessive compulsive disorder)    ??? Recurrent UTI    ??? Sacroiliitis ([place]) 11/12/2011    Noted on Lumbar X-ray: Left Sided Sacroliitis    ??? Strep throat 05/22/2011    + RS   ??? Vision decreased        Past Surgical History:  Past Surgical History:   Procedure Laterality Date   ??? HX DILATION AND CURETTAGE  09/2013       Family History:  Family History   Problem Relation Age of Onset   ??? Hypertension Maternal Grandmother    ???  Thyroid Disease Maternal Grandmother    ??? Diabetes Maternal Grandmother    ??? Hypertension Maternal Grandfather    ??? Hypertension Paternal Grandfather    ??? Hypertension Mother    ??? Asthma Mother    ??? Diabetes Mother    ??? Thyroid Disease Brother    ??? Diabetes Brother    ??? No Known Problems Sister    ??? Diabetes Father    ??? Diabetes Paternal Grandmother    ??? Thyroid Disease Maternal Aunt        Social History:  Social History     Tobacco Use   ??? Smoking status: Never Smoker   ??? Smokeless tobacco: Never Used   Vaping Use   ??? Vaping Use: Never used   Substance Use Topics   ??? Alcohol use: Not Currently   ??? Drug use: Yes     Types: Marijuana       Allergies:  Allergies   Allergen Reactions   ??? Percocet [Oxycodone-Acetaminophen] Nausea and Vomiting  Review of Systems   Review of Systems   Constitutional: Positive for chills, fatigue and fever.   HENT: Positive for congestion.    Respiratory: Positive for cough, chest tightness and shortness of breath.    Musculoskeletal: Positive for myalgias.   All other systems reviewed and are negative.       Physical Exam     Patient Vitals for the past 12 hrs:   Temp Pulse Resp BP SpO2   11/04/19 2016 (!) 101.9 ??F (38.8 ??C) (!) 114 20 (!) 172/102 97 %       Physical Exam  Vitals and nursing note reviewed.   Constitutional:       Appearance: She is obese.   HENT:      Head: Normocephalic and atraumatic.   Eyes:      Extraocular Movements: Extraocular movements intact.   Cardiovascular:      Rate and Rhythm: Normal rate.   Pulmonary:      Effort: Pulmonary effort is normal.      Breath sounds: Normal breath sounds.   Musculoskeletal:         General: Normal range of motion.      Cervical back: Normal range of motion and neck supple.   Skin:     Findings: No rash.   Neurological:      General: No focal deficit present.      Mental Status: She is alert.   Psychiatric:         Mood and Affect: Mood normal.         Behavior: Behavior normal.         Diagnostic Study Results     Labs  -  Recent Results (from the past 12 hour(s))   CBC WITH AUTOMATED DIFF    Collection Time: 11/04/19  8:43 PM   Result Value Ref Range    WBC 4.0 (L) 4.6 - 13.2 K/uL    RBC 4.91 4.20 - 5.30 M/uL    HGB 12.3 12.0 - 16.0 g/dL    HCT 35.3 35.0 - 45.0 %    MCV 71.9 (L) 74.0 - 97.0 FL    MCH 25.1 24.0 - 34.0 PG    MCHC 34.8 31.0 - 37.0 g/dL    RDW 15.0 (H) 11.6 - 14.5 %    PLATELET 208 135 - 420 K/uL    MPV 9.6 9.2 - 11.8 FL    NEUTROPHILS 59 40 - 73 %    LYMPHOCYTES 32 21 - 52 %    MONOCYTES 8 3 - 10 %    EOSINOPHILS 0 0 - 5 %    BASOPHILS 1 0 - 2 %    ABS. NEUTROPHILS 2.4 1.8 - 8.0 K/UL    ABS. LYMPHOCYTES 1.3 0.9 - 3.6 K/UL    ABS. MONOCYTES 0.3 0.05 - 1.2 K/UL    ABS. EOSINOPHILS 0.0 0.0 - 0.4 K/UL    ABS. BASOPHILS 0.0 0.0 - 0.1 K/UL    DF AUTOMATED     METABOLIC PANEL, COMPREHENSIVE    Collection Time: 11/04/19  8:43 PM   Result Value Ref Range    Sodium 139 136 - 145 mmol/L    Potassium 3.5 3.5 - 5.5 mmol/L    Chloride 110 100 - 111 mmol/L    CO2 25 21 - 32 mmol/L    Anion gap 4 3.0 - 18 mmol/L    Glucose 99 74 - 99 mg/dL    BUN 10 7.0 - 18 MG/DL  Creatinine 0.60 0.6 - 1.3 MG/DL    BUN/Creatinine ratio 17 12 - 20      GFR est AA >60 >60 ml/min/1.53m    GFR est non-AA >60 >60 ml/min/1.710m   Calcium 8.7 8.5 - 10.1 MG/DL    Bilirubin, total 0.1 (L) 0.2 - 1.0 MG/DL    ALT (SGPT) 29 13 - 56 U/L    AST (SGOT) 21 10 - 38 U/L    Alk. phosphatase 82 45 - 117 U/L    Protein, total 7.9 6.4 - 8.2 g/dL    Albumin 3.7 3.4 - 5.0 g/dL    Globulin 4.2 (H) 2.0 - 4.0 g/dL    A-G Ratio 0.9 0.8 - 1.7     URINALYSIS W/ RFLX MICROSCOPIC    Collection Time: 11/04/19  8:43 PM   Result Value Ref Range    Color YELLOW      Appearance CLEAR      Specific gravity 1.025 1.005 - 1.030      pH (UA) 6.5 5.0 - 8.0      Protein Negative NEG mg/dL    Glucose Negative NEG mg/dL    Ketone Negative NEG mg/dL    Bilirubin Negative NEG      Blood Negative NEG      Urobilinogen 1.0 0.2 - 1.0 EU/dL    Nitrites Negative NEG      Leukocyte Esterase  Negative NEG     HCG URINE, QL    Collection Time: 11/04/19  8:43 PM   Result Value Ref Range    HCG urine, QL Negative NEG     POC LACTIC ACID    Collection Time: 11/04/19  9:04 PM   Result Value Ref Range    Lactic Acid (POC) 1.47 0.40 - 2.00 mmol/L       Radiologic Studies -   No results found.      Medical Decision Making     ED Course: Progress Notes, Reevaluation, and Consults:    9:31 PM Initial assessment performed. The patients presenting problems have been discussed, and they/their family are in agreement with the care plan formulated and outlined with them.  I have encouraged them to ask questions as they arise throughout their visit.        Provider Notes (Medical Decision Making): [age] old female with history of hypertension asthma presenting with symptoms concerning for COVID-19.  She was febrile and tachycardic in triage stroke and sepsis bundle.  On evaluation she is well-appearing and not in distress with clear lungs.  Chest x-ray with no significant pneumonia.  Labs are unremarkable.  Rapid Covid positive.  Results discussed with the patient and she will make arrangements for isolation.  Will DC on daily aspirin given her obesity.    Procedures:     Critical Care Time:     Vital Signs-Reviewed the patient's vital signs. Reviewed pt's pulse ox reading.     EKG:  Interpreted by the EP.   Time Interpreted:    Rate:    Rhythm:    Interpretation:   Comparison:     Records Reviewed: Nursing Notes and Old Medical Records (Time of Review: 9:31 PM)  -I am the first provider for this patient.  -I reviewed the vital signs, available nursing notes, past medical history, past surgical history, family history and social history.    Current Facility-Administered Medications   Medication Dose Route Frequency Provider Last Rate Last Admin   ??? sodium chloride (NS) flush 5-10 mL  5-10  mL IntraVENous PRN Delmas Faucett A, MD       ??? cefTRIAXone (ROCEPHIN) 2 g in sterile water (preservative free) 20 mL IV syringe  2  g IntraVENous Q24H Alahna Dunne A, MD       ??? azithromycin (ZITHROMAX) 500 mg in 0.9% sodium chloride 250 mL (VIAL-MATE)  500 mg IntraVENous Q24H Javaughn Opdahl A, MD       ??? acetaminophen (TYLENOL) tablet 1,000 mg  1,000 mg Oral ONCE Anjana Cheek A, MD         Current Outpatient Medications   Medication Sig Dispense Refill   ??? labetalol (NORMODYNE) 200 mg tablet Take 1 Tab by mouth two (2) times a day. Indications: high blood pressure 60 Tab 0   ??? levothyroxine (SYNTHROID) 75 mcg tablet Take 1 Tab by mouth every morning. 30 Tab 0   ??? promethazine (PHENERGAN) 25 mg tablet Take 1 Tab by mouth every eight (8) hours as needed for Nausea. 20 Tab 0   ??? cephALEXin (KEFLEX) 500 mg capsule Take 1 Cap by mouth four (4) times daily. Indications: Bacterial Urinary Tract Infection 20 Cap 0   ??? ergocalciferol (VITAMIN D2) 50,000 unit capsule Take 1 Cap by mouth every seven (7) days. 4 Cap 0   ??? albuterol (PROVENTIL HFA, VENTOLIN HFA, PROAIR HFA) 90 mcg/actuation inhaler Take 2 Puffs by inhalation every four (4) hours as needed for Wheezing. 1 Inhaler 1        Clinical Impression     Clinical Impression: No diagnosis found.    Disposition: dc    This note was dictated utilizing voice recognition software which may lead to typographical errors.  I apologize in advance if the situation occurs.  If questions arise please do not hesitate to contact me or call our department.    Lafayette Dragon, MD  9:31 PM

## 2019-11-05 ENCOUNTER — Inpatient Hospital Stay
Admit: 2019-11-05 | Discharge: 2019-11-06 | Disposition: A | Discharge status: Home / Self Care | Payer: MEDICAID | Attending: Emergency Medicine

## 2019-11-05 ENCOUNTER — Emergency Department: Admit: 2019-11-05 | Payer: MEDICAID

## 2019-11-05 DIAGNOSIS — R0489 Hemorrhage from other sites in respiratory passages: Secondary | ICD-10-CM

## 2019-11-05 LAB — METABOLIC PANEL, COMPREHENSIVE
A-G Ratio: 0.9 (ref 0.8–1.7)
ALT (SGPT): 29 U/L (ref 13–56)
AST (SGOT): 21 U/L (ref 10–38)
Albumin: 3.7 g/dL (ref 3.4–5.0)
Alk. phosphatase: 82 U/L (ref 45–117)
Anion gap: 4 mmol/L (ref 3.0–18)
BUN/Creatinine ratio: 17 (ref 12–20)
BUN: 10 MG/DL (ref 7.0–18)
Bilirubin, total: 0.1 MG/DL — ABNORMAL LOW (ref 0.2–1.0)
CO2: 25 mmol/L (ref 21–32)
Calcium: 8.7 MG/DL (ref 8.5–10.1)
Chloride: 110 mmol/L (ref 100–111)
Creatinine: 0.6 MG/DL (ref 0.6–1.3)
GFR est AA: >60 mL/min/{1.73_m2} (ref >60)
GFR est non-AA: >60 mL/min/{1.73_m2} (ref >60)
Globulin: 4.2 g/dL — ABNORMAL HIGH (ref 2.0–4.0)
Glucose: 99 mg/dL (ref 74–99)
Potassium: 3.5 mmol/L (ref 3.5–5.5)
Protein, total: 7.9 g/dL (ref 6.4–8.2)
Sodium: 139 mmol/L (ref 136–145)

## 2019-11-05 LAB — EKG, 12 LEAD, INITIAL
Atrial Rate: 98 {beats}/min
Atrial Rate: 89 {beats}/min
Calculated P Axis: 67 degrees
Calculated P Axis: 50 degrees
Calculated R Axis: 68 degrees
Calculated R Axis: 42 degrees
Calculated T Axis: -12 degrees
Calculated T Axis: 15 degrees
Diagnosis: NORMAL
Diagnosis: NORMAL
P-R Interval: 156 ms
P-R Interval: 152 ms
Q-T Interval: 324 ms
Q-T Interval: 352 ms
QRS Duration: 92 ms
QRS Duration: 90 ms
QTC Calculation (Bezet): 413 ms
QTC Calculation (Bezet): 428 ms
Ventricular Rate: 98 {beats}/min
Ventricular Rate: 89 {beats}/min

## 2019-11-05 LAB — COVID-19 RAPID TEST: COVID-19 rapid test: DETECTED — CR

## 2019-11-05 LAB — METABOLIC PANEL, BASIC
Anion gap: 9 mmol/L (ref 3.0–18)
BUN/Creatinine ratio: 16 (ref 12–20)
BUN: 9 MG/DL (ref 7.0–18)
CO2: 24 mmol/L (ref 21–32)
Calcium: 8.7 MG/DL (ref 8.5–10.1)
Chloride: 106 mmol/L (ref 100–111)
Creatinine: 0.58 MG/DL — ABNORMAL LOW (ref 0.6–1.3)
GFR est AA: >60 mL/min/{1.73_m2} (ref >60)
GFR est non-AA: >60 mL/min/{1.73_m2} (ref >60)
Glucose: 101 mg/dL — ABNORMAL HIGH (ref 74–99)
Potassium: 3.6 mmol/L (ref 3.5–5.5)
Sodium: 139 mmol/L (ref 136–145)

## 2019-11-05 LAB — TROPONIN I: Troponin-I, QT: <0.02 NG/ML (ref 0.0–0.045)

## 2019-11-05 LAB — URINALYSIS W/ RFLX MICROSCOPIC
Bilirubin, Urine: NEGATIVE
Bilirubin: NEGATIVE
Blood, Urine: NEGATIVE
Blood: NEGATIVE
Glucose, Ur: NEGATIVE mg/dL
Glucose: NEGATIVE mg/dL
Ketone: NEGATIVE mg/dL
Ketones, Urine: NEGATIVE mg/dL
Leukocyte Esterase, Urine: NEGATIVE
Leukocyte Esterase: NEGATIVE
Nitrite, Urine: NEGATIVE
Nitrites: NEGATIVE
Protein, UA: NEGATIVE mg/dL
Protein: NEGATIVE mg/dL
Specific Gravity, UA: 1.025 (ref 1.005–1.030)
Specific gravity: 1.025 (ref 1.005–1.030)
Urobilinogen, UA, POCT: 1 EU/dL (ref 0.2–1.0)
Urobilinogen: 1 EU/dL (ref 0.2–1.0)
pH (UA): 6.5 (ref 5.0–8.0)
pH, UA: 6.5 (ref 5.0–8.0)

## 2019-11-05 LAB — CBC WITH AUTOMATED DIFF
ABS. BASOPHILS: 0 10*3/uL (ref 0.0–0.1)
ABS. EOSINOPHILS: 0 10*3/uL (ref 0.0–0.4)
ABS. LYMPHOCYTES: 1.3 10*3/uL (ref 0.9–3.6)
ABS. MONOCYTES: 0.3 10*3/uL (ref 0.05–1.2)
ABS. NEUTROPHILS: 2.4 10*3/uL (ref 1.8–8.0)
BASOPHILS: 1 % (ref 0–2)
EOSINOPHILS: 0 % (ref 0–5)
HCT: 35.3 % (ref 35.0–45.0)
HGB: 12.3 g/dL (ref 12.0–16.0)
LYMPHOCYTES: 32 % (ref 21–52)
MCH: 25.1 PG (ref 24.0–34.0)
MCHC: 34.8 g/dL (ref 31.0–37.0)
MCV: 71.9 FL — ABNORMAL LOW (ref 74.0–97.0)
MONOCYTES: 8 % (ref 3–10)
MPV: 9.6 FL (ref 9.2–11.8)
NEUTROPHILS: 59 % (ref 40–73)
PLATELET: 208 10*3/uL (ref 135–420)
RBC: 4.91 M/uL (ref 4.20–5.30)
RDW: 15 % — ABNORMAL HIGH (ref 11.6–14.5)
WBC: 4 10*3/uL — ABNORMAL LOW (ref 4.6–13.2)

## 2019-11-05 LAB — D DIMER: D DIMER: 0.56 ug/ml(FEU) — ABNORMAL HIGH (ref <0.46)

## 2019-11-05 LAB — HCG QL SERUM
HCG, Ql.: NEGATIVE
HCG, Ql.: NEGATIVE

## 2019-11-05 LAB — HCG URINE, QL
HCG urine, QL: NEGATIVE
Pregnancy Test(Urn): NEGATIVE

## 2019-11-05 LAB — POC LACTIC ACID: Lactic Acid (POC): 1.47 mmol/L (ref 0.40–2.00)

## 2019-11-05 LAB — EKG 12-LEAD
Atrial Rate: 98 {beats}/min
Atrial Rate: 89 {beats}/min
Diagnosis: NORMAL
Diagnosis: NORMAL
P Axis: 67 degrees
P Axis: 50 degrees
P-R Interval: 156 ms
P-R Interval: 152 ms
Q-T Interval: 324 ms
Q-T Interval: 352 ms
QRS Duration: 92 ms
QRS Duration: 90 ms
QTc Calculation (Bazett): 413 ms
QTc Calculation (Bazett): 428 ms
R Axis: 68 degrees
R Axis: 42 degrees
T Axis: -12 degrees
T Axis: 15 degrees
Ventricular Rate: 98 {beats}/min
Ventricular Rate: 89 {beats}/min

## 2019-11-05 LAB — CBC WITH AUTO DIFFERENTIAL
Basophils %: 1 % (ref 0–2)
Basophils Absolute: 0 10*3/uL (ref 0.0–0.1)
Eosinophils %: 0 % (ref 0–5)
Eosinophils Absolute: 0 10*3/uL (ref 0.0–0.4)
Hematocrit: 35.3 % (ref 35.0–45.0)
Hemoglobin: 12.3 g/dL (ref 12.0–16.0)
Lymphocytes %: 32 % (ref 21–52)
Lymphocytes Absolute: 1.3 10*3/uL (ref 0.9–3.6)
MCH: 25.1 PG (ref 24.0–34.0)
MCHC: 34.8 g/dL (ref 31.0–37.0)
MCV: 71.9 FL — ABNORMAL LOW (ref 74.0–97.0)
MPV: 9.6 FL (ref 9.2–11.8)
Monocytes %: 8 % (ref 3–10)
Monocytes Absolute: 0.3 10*3/uL (ref 0.05–1.2)
Neutrophils %: 59 % (ref 40–73)
Neutrophils Absolute: 2.4 10*3/uL (ref 1.8–8.0)
Platelets: 208 10*3/uL (ref 135–420)
RBC: 4.91 M/uL (ref 4.20–5.30)
RDW: 15 % — ABNORMAL HIGH (ref 11.6–14.5)
WBC: 4 10*3/uL — ABNORMAL LOW (ref 4.6–13.2)

## 2019-11-05 LAB — COMPREHENSIVE METABOLIC PANEL
ALT: 29 U/L (ref 13–56)
AST: 21 U/L (ref 10–38)
Albumin/Globulin Ratio: 0.9 (ref 0.8–1.7)
Albumin: 3.7 g/dL (ref 3.4–5.0)
Alkaline Phosphatase: 82 U/L (ref 45–117)
Anion Gap: 4 mmol/L (ref 3.0–18)
BUN: 10 MG/DL (ref 7.0–18)
Bun/Cre Ratio: 17 (ref 12–20)
CO2: 25 mmol/L (ref 21–32)
Calcium: 8.7 MG/DL (ref 8.5–10.1)
Chloride: 110 mmol/L (ref 100–111)
Creatinine: 0.6 MG/DL (ref 0.6–1.3)
EGFR IF NonAfrican American: >60 mL/min/{1.73_m2} (ref >60)
GFR African American: >60 mL/min/{1.73_m2} (ref >60)
Globulin: 4.2 g/dL — ABNORMAL HIGH (ref 2.0–4.0)
Glucose: 99 mg/dL (ref 74–99)
Potassium: 3.5 mmol/L (ref 3.5–5.5)
Sodium: 139 mmol/L (ref 136–145)
Total Bilirubin: 0.1 MG/DL — ABNORMAL LOW (ref 0.2–1.0)
Total Protein: 7.9 g/dL (ref 6.4–8.2)

## 2019-11-05 LAB — BASIC METABOLIC PANEL
Anion Gap: 9 mmol/L (ref 3.0–18)
BUN: 9 MG/DL (ref 7.0–18)
Bun/Cre Ratio: 16 (ref 12–20)
CO2: 24 mmol/L (ref 21–32)
Calcium: 8.7 MG/DL (ref 8.5–10.1)
Chloride: 106 mmol/L (ref 100–111)
Creatinine: 0.58 MG/DL — ABNORMAL LOW (ref 0.6–1.3)
EGFR IF NonAfrican American: >60 mL/min/{1.73_m2} (ref >60)
GFR African American: >60 mL/min/{1.73_m2} (ref >60)
Glucose: 101 mg/dL — ABNORMAL HIGH (ref 74–99)
Potassium: 3.6 mmol/L (ref 3.5–5.5)
Sodium: 139 mmol/L (ref 136–145)

## 2019-11-05 LAB — POCT LACTIC ACID: POC Lactic Acid: 1.47 mmol/L (ref 0.40–2.00)

## 2019-11-05 LAB — TROPONIN: Troponin I: <0.02 NG/ML (ref 0.0–0.045)

## 2019-11-05 LAB — COVID-19, RAPID: SARS-CoV-2, Rapid: DETECTED — CR

## 2019-11-05 LAB — D-DIMER, QUANTITATIVE: D-Dimer, Quant: 0.56 ug/ml(FEU) — ABNORMAL HIGH (ref <0.46)

## 2019-11-05 MED ORDER — ACETAMINOPHEN 500 MG TAB
500 mg | Freq: Once | ORAL | Status: AC
Start: 2019-11-05 — End: 2019-11-04
  Administered 2019-11-05: 02:00:00 via ORAL

## 2019-11-05 MED ORDER — MORPHINE 4 MG/ML INTRAVENOUS SOLUTION
4 mg/mL | INTRAVENOUS | Status: AC
Start: 2019-11-05 — End: 2019-11-05
  Administered 2019-11-05: 21:00:00 via INTRAVENOUS

## 2019-11-05 MED ORDER — SODIUM CHLORIDE 0.9 % IJ SYRG
INTRAMUSCULAR | Status: DC | PRN
Start: 2019-11-05 — End: 2019-11-05

## 2019-11-05 MED ORDER — WATER FOR INJECTION, STERILE INJECTION
2 gram | INTRAMUSCULAR | Status: DC
Start: 2019-11-05 — End: 2019-11-04

## 2019-11-05 MED ORDER — ASPIRIN 81 MG CHEWABLE TAB
81 mg | ORAL_TABLET | Freq: Every day | ORAL | 0 refills | Status: DC
Start: 2019-11-05 — End: 2019-11-05

## 2019-11-05 MED ORDER — KETOROLAC TROMETHAMINE 15 MG/ML INJECTION
15 mg/mL | Freq: Once | INTRAMUSCULAR | Status: AC
Start: 2019-11-05 — End: 2019-11-05
  Administered 2019-11-05: 15:00:00 via INTRAVENOUS

## 2019-11-05 MED ORDER — CEFTRIAXONE 1 GRAM SOLUTION FOR INJECTION
1 gram | INTRAMUSCULAR | Status: DC
Start: 2019-11-05 — End: 2019-11-05

## 2019-11-05 MED ORDER — SODIUM CHLORIDE 0.9 % IV
60-60 mg- mg/ mL | Freq: Once | INTRAVENOUS | Status: AC
Start: 2019-11-05 — End: 2019-11-05
  Administered 2019-11-05: 23:00:00 via INTRAVENOUS

## 2019-11-05 MED ORDER — SODIUM CHLORIDE 0.9 % IV
500 mg | INTRAVENOUS | Status: DC
Start: 2019-11-05 — End: 2019-11-06
  Administered 2019-11-05: 19:00:00 via INTRAVENOUS

## 2019-11-05 MED ORDER — CODEINE-GUAIFENESIN 10 MG-100 MG/5 ML ORAL LIQUID
100-10 mg/5 mL | ORAL | Status: AC
Start: 2019-11-05 — End: 2019-11-05
  Administered 2019-11-05: 19:00:00 via ORAL

## 2019-11-05 MED ORDER — VIAL-MATE ADAPTOR
500 mg | Status: DC
Start: 2019-11-05 — End: 2019-11-04

## 2019-11-05 MED ORDER — IOPAMIDOL 76 % IV SOLN
76 % | Freq: Once | INTRAVENOUS | Status: AC
Start: 2019-11-05 — End: 2019-11-05
  Administered 2019-11-05: 16:00:00 via INTRAVENOUS

## 2019-11-05 MED ORDER — CEFTRIAXONE 1 GRAM SOLUTION FOR INJECTION
1 gram | INTRAMUSCULAR | Status: AC
Start: 2019-11-05 — End: 2019-11-05
  Administered 2019-11-05: 19:00:00 via INTRAVENOUS

## 2019-11-05 MED FILL — AZITHROMYCIN 500 MG IV SOLUTION: 500 mg | INTRAVENOUS | Qty: 5

## 2019-11-05 MED FILL — ISOVUE-370  76 % INTRAVENOUS SOLUTION: 370 mg iodine /mL (76 %) | INTRAVENOUS | Qty: 80

## 2019-11-05 MED FILL — KETOROLAC TROMETHAMINE 15 MG/ML INJECTION: 15 mg/mL | INTRAMUSCULAR | Qty: 1

## 2019-11-05 MED FILL — CEFTRIAXONE 1 GRAM SOLUTION FOR INJECTION: 1 gram | INTRAMUSCULAR | Qty: 1

## 2019-11-05 MED FILL — CODEINE-GUAIFENESIN 10 MG-100 MG/5 ML ORAL LIQUID: 100-10 mg/5 mL | ORAL | Qty: 10

## 2019-11-05 MED FILL — ACETAMINOPHEN 500 MG TAB: 500 mg | ORAL | Qty: 2

## 2019-11-05 MED FILL — REGEN-COV (EUA) 60 MG-60 MG/ML INTRAVENOUS SOLUTION: 60 mg- mg/ mL | INTRAVENOUS | Qty: 10

## 2019-11-05 MED FILL — MORPHINE 4 MG/ML SYRINGE: 4 mg/mL | INTRAMUSCULAR | Qty: 1

## 2019-11-05 MED FILL — BD POSIFLUSH NORMAL SALINE 0.9 % INJECTION SYRINGE: INTRAMUSCULAR | Qty: 10

## 2019-11-05 NOTE — ED Notes (Signed)
Healthy Choice meal brought to the pt at this time, along with water.

## 2019-11-05 NOTE — ED Notes (Signed)
Patient asleep on bed with equal, unlabored respirations.

## 2019-11-05 NOTE — ED Notes (Signed)
After rocephin was administered to the pt through her IV, pt had 1 episode of vomiting & c/o of stomach cramping a few moments after the vomiting. Ladona Ridgel MD aware.

## 2019-11-05 NOTE — ED Notes (Signed)
Report given to Tom at this time. Pt in stable condition.

## 2019-11-05 NOTE — ED Notes (Signed)
Patient resting comfortably on bed. Remains on cardiac, BP and pulse ox monitors. Denies any needs at this time. Bed rails up, bed in lowest position and locked, call bell with in reach.

## 2019-11-05 NOTE — ED Notes (Signed)
I have reviewed discharge instructions with the patient.  The patient verbalized understanding. Patient looks comfortable, left ED in stable condition. Ambulatory, steady gait noted.

## 2019-11-05 NOTE — ED Notes (Signed)
Pt left at this time for CT with the CT tech

## 2019-11-05 NOTE — ED Notes (Signed)
Pt arrived by EMS with complaints of shortness of breath & increased work of breathing. Pt d/c from [facility] yesterday. Pt confirmed positive for COVID yesterday.
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
## 2019-11-06 MED ORDER — DOXYCYCLINE HYCLATE 100 MG TAB
100 mg | ORAL_TABLET | Freq: Two times a day (BID) | ORAL | 0 refills | Status: AC
Start: 2019-11-06 — End: 2019-11-20

## 2019-11-06 MED ORDER — KETOROLAC TROMETHAMINE 15 MG/ML INJECTION
15 mg/mL | INTRAMUSCULAR | Status: AC
Start: 2019-11-06 — End: 2019-11-06
  Administered 2019-11-06: 09:00:00 via INTRAVENOUS

## 2019-11-06 MED ORDER — ACETAMINOPHEN 500 MG TAB
500 mg | ORAL | Status: AC
Start: 2019-11-06 — End: 2019-11-05
  Administered 2019-11-06: 01:00:00 via ORAL

## 2019-11-06 MED ORDER — MORPHINE 4 MG/ML INTRAVENOUS SOLUTION
4 mg/mL | INTRAVENOUS | Status: DC
Start: 2019-11-06 — End: 2019-11-06

## 2019-11-06 MED FILL — ACETAMINOPHEN 500 MG TAB: 500 mg | ORAL | Qty: 2

## 2019-11-06 MED FILL — MORPHINE 4 MG/ML SYRINGE: 4 mg/mL | INTRAMUSCULAR | Qty: 1

## 2019-11-06 MED FILL — KETOROLAC TROMETHAMINE 15 MG/ML INJECTION: 15 mg/mL | INTRAMUSCULAR | Qty: 1

## 2019-11-06 NOTE — ED Notes (Signed)
Patient changed to hospital bed. Remains on cardiac, BP and pulse ox monitors. Denies any needs at this time.
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
## 2019-11-06 NOTE — ED Notes (Signed)
Patient asleep on bed. No signs of acute distress.

## 2019-11-06 NOTE — ED Notes (Signed)
Patient asleep with equal, unlabored respirations.

## 2019-11-08 NOTE — Progress Notes (Signed)
Patient contacted regarding COVID-19 diagnosis.     Care Transition Nurse contacted the patient by telephone to perform post discharge assessment. Call within 2 business days of discharge: Yes Verified name and DOB with patient as identifiers. Provided introduction to self, and explanation of the CTN/ACM role, and reason for call due to risk factors for infection and/or exposure to COVID-19.     Patient reports that she is doing okay.   Pt. Declined future phone calls, Pt. Kept the conversation short and ended the call.     No further outreach scheduled with this CTN per Pt. Request. This  Episode of Care resolved.

## 2019-11-10 LAB — CULTURE, BLOOD
Culture result:: NO GROWTH
Culture result:: NO GROWTH

## 2019-11-10 LAB — CULTURE, BLOOD 1
Culture: NO GROWTH
Culture: NO GROWTH

## 2020-02-16 DEATH — deceased
